# Patient Record
Sex: Male | Born: 1940 | Race: White | Hispanic: No | Marital: Married | Smoking: Current every day smoker
Health system: Southern US, Community
[De-identification: ages and names within clinical notes are randomized; demographics above are authoritative.]

## PROBLEM LIST (undated history)

## (undated) DIAGNOSIS — C61 Malignant neoplasm of prostate: Secondary | ICD-10-CM

## (undated) DIAGNOSIS — I10 Essential (primary) hypertension: Secondary | ICD-10-CM

## (undated) DIAGNOSIS — F17200 Nicotine dependence, unspecified, uncomplicated: Secondary | ICD-10-CM

## (undated) DIAGNOSIS — I2510 Atherosclerotic heart disease of native coronary artery without angina pectoris: Secondary | ICD-10-CM

## (undated) DIAGNOSIS — I219 Acute myocardial infarction, unspecified: Secondary | ICD-10-CM

## (undated) DIAGNOSIS — C801 Malignant (primary) neoplasm, unspecified: Secondary | ICD-10-CM

## (undated) DIAGNOSIS — D2321 Other benign neoplasm of skin of right ear and external auricular canal: Secondary | ICD-10-CM

## (undated) DIAGNOSIS — E785 Hyperlipidemia, unspecified: Secondary | ICD-10-CM

## (undated) DIAGNOSIS — R0600 Dyspnea, unspecified: Secondary | ICD-10-CM

## (undated) DIAGNOSIS — K635 Polyp of colon: Secondary | ICD-10-CM

## (undated) HISTORY — DX: Hyperlipidemia, unspecified: E78.5

## (undated) HISTORY — PX: CORONARY ANGIOPLASTY WITH STENT PLACEMENT: SHX49

## (undated) HISTORY — DX: Malignant (primary) neoplasm, unspecified: C80.1

## (undated) HISTORY — PX: CARDIAC SURGERY: SHX584

## (undated) HISTORY — DX: Other benign neoplasm of skin of right ear and external auricular canal: D23.21

## (undated) HISTORY — DX: Nicotine dependence, unspecified, uncomplicated: F17.200

## (undated) HISTORY — DX: Polyp of colon: K63.5

---

## 1978-02-14 HISTORY — PX: APPENDECTOMY: SHX54

## 2004-02-15 DIAGNOSIS — C801 Malignant (primary) neoplasm, unspecified: Secondary | ICD-10-CM

## 2004-02-15 HISTORY — DX: Malignant (primary) neoplasm, unspecified: C80.1

## 2004-02-15 HISTORY — PX: HIGH DOSE RADIATION IMPLANT INSERTION: SHX6258

## 2016-03-09 DIAGNOSIS — N401 Enlarged prostate with lower urinary tract symptoms: Secondary | ICD-10-CM | POA: Diagnosis not present

## 2016-04-14 DIAGNOSIS — I739 Peripheral vascular disease, unspecified: Secondary | ICD-10-CM | POA: Diagnosis not present

## 2016-04-14 DIAGNOSIS — I2510 Atherosclerotic heart disease of native coronary artery without angina pectoris: Secondary | ICD-10-CM | POA: Diagnosis not present

## 2016-04-14 DIAGNOSIS — R0609 Other forms of dyspnea: Secondary | ICD-10-CM | POA: Diagnosis not present

## 2016-04-14 DIAGNOSIS — R7301 Impaired fasting glucose: Secondary | ICD-10-CM | POA: Diagnosis not present

## 2017-01-20 DIAGNOSIS — Z23 Encounter for immunization: Secondary | ICD-10-CM | POA: Diagnosis not present

## 2017-05-03 ENCOUNTER — Ambulatory Visit
Admission: EM | Admit: 2017-05-03 | Discharge: 2017-05-03 | Disposition: A | Discharge status: Home / Self Care | Payer: Medicare Other | Attending: Family Medicine | Admitting: Family Medicine

## 2017-05-03 ENCOUNTER — Encounter: Payer: Self-pay | Admitting: Emergency Medicine

## 2017-05-03 ENCOUNTER — Other Ambulatory Visit: Payer: Self-pay

## 2017-05-03 DIAGNOSIS — Z20828 Contact with and (suspected) exposure to other viral communicable diseases: Secondary | ICD-10-CM | POA: Diagnosis not present

## 2017-05-03 DIAGNOSIS — I10 Essential (primary) hypertension: Secondary | ICD-10-CM

## 2017-05-03 DIAGNOSIS — Z760 Encounter for issue of repeat prescription: Secondary | ICD-10-CM | POA: Diagnosis not present

## 2017-05-03 HISTORY — DX: Acute myocardial infarction, unspecified: I21.9

## 2017-05-03 HISTORY — DX: Essential (primary) hypertension: I10

## 2017-05-03 MED ORDER — AMLODIPINE BESYLATE 10 MG PO TABS: 10.0000 mg | ORAL_TABLET | Freq: Every day | ORAL | 0 refills | Status: DC

## 2017-05-03 MED ORDER — OSELTAMIVIR PHOSPHATE 75 MG PO CAPS: 75.0000 mg | ORAL_CAPSULE | Freq: Every day | ORAL | 0 refills | Status: DC

## 2017-05-03 NOTE — Discharge Instructions (Signed)
Take medication as prescribed. Rest.   Establish primary physician as discussed. Return to Urgent care for new or worsening concerns.

## 2017-05-03 NOTE — ED Triage Notes (Signed)
Patient states that he has chronic sinus drainage.  Patient states that his granddaughter was recently diagnosed with the flu and would like a prescription for Tamiflu for prevention.

## 2017-05-03 NOTE — ED Provider Notes (Signed)
[facility] ____________________________________________  Time seen: Approximately 4:37 PM  I have reviewed the triage vital signs and the nursing notes.   HISTORY  Chief Complaint Nasal Congestion   HPI Dylan Villanueva is a 77 y.o. male presenting for request of prophylactic Tamiflu.  Patient reports that his 74-year-old granddaughter was diagnosed with flu yesterday and he has had direct contact with her.  States that he is new to this area from [place] and has not yet established with a primary care physician.  States he does have chronic postnasal drip without any acute changes.  Also reports that he has chronic hypertension and takes amlodipine, and requests medication refill until he is established with a new primary.  Denies any recent changes in his blood pressure medication and reports that he has been working well from him.  Reports feels well. Denies chest pain, shortness of breath, abdominal pain, or rash. Denies recent sickness. Denies recent antibiotic use.  Denies renal insufficiency   Past Medical History:  Diagnosis Date  . Hypertension   . Myocardial infarction ([place])     There are no active problems to display for this patient.   Past Surgical History:  Procedure Laterality Date  . CARDIAC SURGERY    Cardiac stents x2   No current facility-administered medications for this encounter.   Current Outpatient Medications:  .  aspirin EC 81 MG tablet, Take 81 mg by mouth daily., Disp: , Rfl:  .  atorvastatin (LIPITOR) 40 MG tablet, Take 40 mg by mouth daily., Disp: , Rfl:  .  cilostazol (PLETAL) 100 MG tablet, Take 100 mg by mouth 2 (two) times daily., Disp: , Rfl:  .  clopidogrel (PLAVIX) 75 MG tablet, Take 75 mg by mouth daily., Disp: , Rfl:  .  doxazosin (CARDURA) 2 MG tablet, Take 2 mg by mouth daily., Disp: , Rfl:  .  Multiple Vitamins-Minerals (MULTIVITAMIN ADULT PO), Take by mouth., Disp: , Rfl:  .  Vitamin D, Cholecalciferol, 1000 units CAPS,  Take by mouth., Disp: , Rfl:  .  amLODipine (NORVASC) 10 MG tablet, Take 1 tablet (10 mg total) by mouth daily., Disp: 30 tablet, Rfl: 0 .  oseltamivir (TAMIFLU) 75 MG capsule, Take 1 capsule (75 mg total) by mouth daily for 7 days., Disp: 7 capsule, Rfl: 0  Allergies Penicillins  History reviewed. No pertinent family history.  Social History Social History   Tobacco Use  . Smoking status: Current Every Day Smoker    Types: Cigarettes  . Smokeless tobacco: Never Used  Substance Use Topics  . Alcohol use: Yes  . Drug use: Not on file    Review of Systems Constitutional: No fever/chills ENT: No sore throat. Cardiovascular: Denies chest pain. Respiratory: Denies shortness of breath. Gastrointestinal: No abdominal pain.  Genitourinary: Negative for dysuria. Musculoskeletal: Negative for back pain. Skin: Negative for rash.   ____________________________________________   PHYSICAL EXAM:  VITAL SIGNS: ED Triage Vitals  Enc Vitals Group     BP 05/03/17 1628 (!) 167/76     Pulse Rate 05/03/17 1628 62     Resp 05/03/17 1628 16     Temp 05/03/17 1628 98.4 F (36.9 C)     Temp Source 05/03/17 1628 Oral     SpO2 05/03/17 1628 99 %     Weight 05/03/17 1623 155 lb (70.3 kg)     Height 05/03/17 1623 5\' 7"  (1.702 m)     Head Circumference --      Peak Flow --  Pain Score 05/03/17 1623 0     Pain Loc --      Pain Edu? --      Excl. in [place]? --     Constitutional: Alert and oriented. Well appearing and in no acute distress. Eyes: Conjunctivae are normal.  Head: Atraumatic. No sinus tenderness to palpation. No swelling. No erythema.  Ears: no erythema, normal TMs bilaterally.   Nose:No nasal congestion   Mouth/Throat: Mucous membranes are moist. No pharyngeal erythema. No tonsillar swelling or exudate.  Neck: No stridor.  No cervical spine tenderness to palpation. Hematological/Lymphatic/Immunilogical: No cervical lymphadenopathy. Cardiovascular: Normal rate, regular  rhythm. Grossly normal heart sounds.  Good peripheral circulation. Respiratory: Normal respiratory effort.  No retractions. No wheezes, rales or rhonchi. Good air movement.  Musculoskeletal: Ambulatory with steady gait.  Neurologic:  Normal speech and language. No gait instability. Skin:  Skin appears warm, dry and intact. No rash noted. Psychiatric: Mood and affect are normal. Speech and behavior are normal.  ___________________________________________   LABS (all labs ordered are listed, but only abnormal results are displayed)  Labs Reviewed - No data to display ____________________________________________  PROCEDURES Procedures   INITIAL IMPRESSION / ASSESSMENT AND PLAN / ED COURSE  Pertinent labs & imaging results that were available during my care of the patient were reviewed by me and considered in my medical decision making (see chart for details).  Well-appearing patient.  No acute distress.  Prophylactic Tamiflu Rx given.  Amlodipine Rx refill also given for 30 days.  Encourage PCP establishment, Dr. Vicente Masson information given.Discussed indication, risks and benefits of medications with patient.   Discussed follow up and return parameters including no resolution or any worsening concerns. Patient verbalized understanding and agreed to plan.   ____________________________________________   FINAL CLINICAL IMPRESSION(S) / ED DIAGNOSES  Final diagnoses:  Exposure to influenza  Hypertension, unspecified type  Medication refill     ED Discharge Orders        Ordered    amLODipine (NORVASC) 10 MG tablet  Daily     05/03/17 1642    oseltamivir (TAMIFLU) 75 MG capsule  Daily     05/03/17 1642       Note: This dictation was prepared with Dragon dictation along with smaller phrase technology. Any transcriptional errors that result from this process are unintentional.         Marylene Land, NP 05/03/17 1839

## 2017-05-08 ENCOUNTER — Ambulatory Visit (INDEPENDENT_AMBULATORY_CARE_PROVIDER_SITE_OTHER): Payer: Medicare Other | Admitting: Family Medicine

## 2017-05-08 ENCOUNTER — Encounter: Payer: Self-pay | Admitting: Family Medicine

## 2017-05-08 VITALS — BP 122/81 | HR 78 | Resp 16 | Ht 67.0 in | Wt 152.1 lb

## 2017-05-08 DIAGNOSIS — E782 Mixed hyperlipidemia: Secondary | ICD-10-CM | POA: Insufficient documentation

## 2017-05-08 DIAGNOSIS — Z8546 Personal history of malignant neoplasm of prostate: Secondary | ICD-10-CM

## 2017-05-08 DIAGNOSIS — E785 Hyperlipidemia, unspecified: Secondary | ICD-10-CM | POA: Diagnosis not present

## 2017-05-08 DIAGNOSIS — Z23 Encounter for immunization: Secondary | ICD-10-CM | POA: Diagnosis not present

## 2017-05-08 DIAGNOSIS — I872 Venous insufficiency (chronic) (peripheral): Secondary | ICD-10-CM | POA: Insufficient documentation

## 2017-05-08 DIAGNOSIS — R2681 Unsteadiness on feet: Secondary | ICD-10-CM

## 2017-05-08 DIAGNOSIS — E559 Vitamin D deficiency, unspecified: Secondary | ICD-10-CM

## 2017-05-08 DIAGNOSIS — I2510 Atherosclerotic heart disease of native coronary artery without angina pectoris: Secondary | ICD-10-CM | POA: Diagnosis not present

## 2017-05-08 DIAGNOSIS — I739 Peripheral vascular disease, unspecified: Secondary | ICD-10-CM

## 2017-05-08 DIAGNOSIS — I10 Essential (primary) hypertension: Secondary | ICD-10-CM

## 2017-05-08 DIAGNOSIS — F17200 Nicotine dependence, unspecified, uncomplicated: Secondary | ICD-10-CM | POA: Diagnosis not present

## 2017-05-08 DIAGNOSIS — Z955 Presence of coronary angioplasty implant and graft: Secondary | ICD-10-CM | POA: Diagnosis not present

## 2017-05-08 DIAGNOSIS — Z8619 Personal history of other infectious and parasitic diseases: Secondary | ICD-10-CM | POA: Diagnosis not present

## 2017-05-08 MED ORDER — TETANUS-DIPHTH-ACELL PERTUSSIS 5-2.5-18.5 LF-MCG/0.5 IM SUSP: 0.5000 mL | Freq: Once | INTRAMUSCULAR | 0 refills | Status: AC

## 2017-05-08 MED ORDER — ZOSTER VAC RECOMB ADJUVANTED 50 MCG/0.5ML IM SUSR: 0.5000 mL | Freq: Once | INTRAMUSCULAR | 1 refills | Status: AC

## 2017-05-08 MED ORDER — AMLODIPINE BESYLATE 10 MG PO TABS: 10.0000 mg | ORAL_TABLET | Freq: Every day | ORAL | 2 refills | Status: DC

## 2017-05-08 MED ORDER — CILOSTAZOL 100 MG PO TABS: 100.0000 mg | ORAL_TABLET | Freq: Two times a day (BID) | ORAL | 2 refills | Status: DC

## 2017-05-08 MED ORDER — ATORVASTATIN CALCIUM 40 MG PO TABS: 40.0000 mg | ORAL_TABLET | Freq: Every day | ORAL | 2 refills | Status: DC

## 2017-05-08 MED ORDER — LISINOPRIL 5 MG PO TABS
5.0000 mg | ORAL_TABLET | Freq: Every day | ORAL | 2 refills | Status: DC
Start: 2017-05-08 — End: 2017-06-08

## 2017-05-08 MED ORDER — CLOPIDOGREL BISULFATE 75 MG PO TABS: 75.0000 mg | ORAL_TABLET | Freq: Every day | ORAL | 2 refills | Status: DC

## 2017-05-08 NOTE — Progress Notes (Signed)
Date:  05/08/2017   Name:  Dylan Villanueva   DOB:  [birth year]   MRN:  [id number]  PCP:  Adline Potter, MD    Chief Complaint: Establish Care (Moved from [place] will need refill to Cardiology )   History of Present Illness:  This is a 77 y.o. male seen for initial visit, just moved here from [place]. CAD with MI 2006 and stent placement 2008, on DAPT/statin, no recent cardiac sxs, wants cards referral. PAD BLE L>R on DAPT/statin/Pletal, not surgical candidate, feels Pletal helps. Hx prostate ca 2008 s/p RT and pellet placement, seen yearly by urology, wants referral. Appy 1980, SCC removed R pinna. Smoker 1 ppd, willing to cut down but not quit. Father died 49 dementia/hip fx/pneumonia, mother died 77 pancreatic ca, brother died 59 brain aneurysm, sister died 38 breast ca. Had Pneumovax age 31, no Prevnar, tet status unknown, hx shingles, no zoster imm.  Colonoscopy 5 years ago showed polyps.  Review of Systems:  Review of Systems  Constitutional: Negative for chills and fever.  HENT: Negative for ear pain, sinus pain and trouble swallowing.   Eyes: Negative for pain.  Respiratory: Negative for cough and shortness of breath.   Cardiovascular: Negative for chest pain and leg swelling.  Gastrointestinal: Negative for abdominal pain, constipation and diarrhea.  Genitourinary: Negative for difficulty urinating.  Musculoskeletal: Negative for joint swelling.  Neurological: Negative for syncope and light-headedness.    Patient Active Problem List   Diagnosis Date Noted  . PAD (peripheral artery disease) ([place]) 05/08/2017  . CAD (coronary artery disease) 05/08/2017  . Hyperlipidemia 05/08/2017  . Hypertension 05/08/2017  . History of prostate cancer 05/08/2017  . Gait instability 05/08/2017  . Vitamin D deficiency 05/08/2017  . Presence of stent in coronary artery in patient with coronary artery disease 05/08/2017    Prior to Admission medications   Medication Sig Start Date End Date Taking?  Authorizing Provider  amLODipine (NORVASC) 10 MG tablet Take 1 tablet (10 mg total) by mouth daily. 05/08/17  Yes Arnetta Odeh, Gwyndolyn Saxon, MD  aspirin EC 81 MG tablet Take 81 mg by mouth daily.   Yes [provider]  atorvastatin (LIPITOR) 40 MG tablet Take 1 tablet (40 mg total) by mouth daily. 05/08/17  Yes Yadira Hada, Gwyndolyn Saxon, MD  cilostazol (PLETAL) 100 MG tablet Take 1 tablet (100 mg total) by mouth 2 (two) times daily. 05/08/17  Yes Hilary Milks, Gwyndolyn Saxon, MD  clopidogrel (PLAVIX) 75 MG tablet Take 1 tablet (75 mg total) by mouth daily. 05/08/17  Yes Kyrene Longan, Gwyndolyn Saxon, MD  Multiple Vitamins-Minerals (MULTIVITAMIN ADULT PO) Take 1 capsule by mouth daily.   Yes [provider]  Vitamin D, Cholecalciferol, 1000 units CAPS Take 1 capsule by mouth daily.   Yes [provider]  lisinopril (PRINIVIL,ZESTRIL) 5 MG tablet Take 1 tablet (5 mg total) by mouth daily. 05/08/17   Laurren Lepkowski, Gwyndolyn Saxon, MD  Tdap [facility]es) 5-2.5-18.5 LF-MCG/0.5 injection Inject 0.5 mLs into the muscle once for 1 dose. 05/08/17 05/08/17  Tanishia Lemaster, Gwyndolyn Saxon, MD  Zoster Vaccine Adjuvanted [facility]) injection Inject 0.5 mLs into the muscle once for 1 dose. 05/08/17 05/08/17  Adline Potter, MD    Allergies  Allergen Reactions  . Penicillins Nausea Only    Past Surgical History:  Procedure Laterality Date  . APPENDECTOMY  1980  . CARDIAC SURGERY    . CORONARY ANGIOPLASTY WITH STENT PLACEMENT    . HIGH DOSE RADIATION IMPLANT INSERTION  2006    Social History   Tobacco Use  . Smoking  status: Current Every Day Smoker    Packs/day: 1.00    Years: 65.00    Pack years: 65.00    Types: Cigarettes  . Smokeless tobacco: Never Used  . Tobacco comment:      Substance Use Topics  . Alcohol use: Yes    Alcohol/week: 2.4 oz    Types: 4 Cans of beer per week  . Drug use: Not on file    Family History  Problem Relation Age of Onset  . Cancer Mother        pancriaticv  . Cancer Sister        breast   . Aneurysm Brother      Medication list has been reviewed and updated.  Physical Examination: BP 122/81   Pulse 78   Resp 16   Ht 5\' 7"  (1.702 m)   Wt 152 lb 1.6 oz (69 kg)   SpO2 98%   BMI 23.82 kg/m   Physical Exam  Constitutional: He is oriented to person, place, and time. He appears well-developed and well-nourished.  HENT:  Right Ear: External ear normal.  Left Ear: External ear normal.  Nose: Nose normal.  Mouth/Throat: Oropharynx is clear and moist.  TMs clear  Eyes: Pupils are equal, round, and reactive to light. Conjunctivae and EOM are normal.  Neck: Neck supple. No thyromegaly present.  Cardiovascular: Normal rate, regular rhythm and normal heart sounds.  Pulmonary/Chest: Effort normal and breath sounds normal.  Abdominal: Soft. He exhibits no distension and no mass. There is no tenderness.  Musculoskeletal: He exhibits no edema.  Lymphadenopathy:    He has no cervical adenopathy.  Neurological: He is alert and oriented to person, place, and time. Coordination normal.  Romberg pos, gait sl unsteady  Skin: Skin is warm and dry.  Psychiatric: He has a normal mood and affect. His behavior is normal.  Nursing note and vitals reviewed.   Assessment and Plan:  1. PAD (peripheral artery disease) ([place]) Not surgical candidate, cont DAPT/statin/Pletal (feels helps)  2. Presence of stent in coronary artery in patient with coronary artery disease Stable, cont DAPT (unclear indication given last stent placement 2008), statin, add ACEI - Ambulatory referral to Cardiology  3. Essential hypertension Well controlled, cont amlodipine, change Cardura to lisinopril - Comprehensive Metabolic Panel (CMET) - CBC  4. Hyperlipidemia, unspecified hyperlipidemia type Unclear control on Lipitor - TSH - Lipid Profile  5. Gait instability - B12  6. Vitamin D deficiency On supplement - Vitamin D (25 hydroxy)  7. Smoker Advised cessation, willing to cut back to 1/2 ppd - Nurse to provide  smoking / tobacco cessation education  8. History of prostate cancer S/p RT - Ambulatory referral to Urology  9. History of shingles  10. Need for pneumococcal vaccination - Pneumococcal conjugate vaccine 13-valent  11. Need for diphtheria-tetanus-pertussis (Tdap) vaccine - Tdap (BOOSTRIX) 5-2.5-18.5 LF-MCG/0.5 injection; Inject 0.5 mLs into the muscle once for 1 dose.  Dispense: 0.5 mL; Refill: 0  12. Need for zoster vaccination - Zoster Vaccine Adjuvanted [facility]) injection; Inject 0.5 mLs into the muscle once for 1 dose.  Dispense: 0.5 mL; Refill: 1  Return in about 1 month (around 06/05/2017).   One hour spent with patient over half in counseling  Satira Anis. [facility]  05/08/2017

## 2017-05-08 NOTE — Patient Instructions (Addendum)
Change doxazosin to lisinopril 5 mg daily.

## 2017-05-09 LAB — LIPID PANEL
CHOLESTEROL TOTAL: 208 mg/dL — AB (ref 100–199)
Chol/HDL Ratio: 3.6 ratio (ref 0.0–5.0)
HDL: 57 mg/dL (ref >39)
LDL CALC: 130 mg/dL — AB (ref 0–99)
Triglycerides: 107 mg/dL (ref 0–149)
VLDL CHOLESTEROL CAL: 21 mg/dL (ref 5–40)

## 2017-05-09 LAB — TSH: TSH: 1.05 u[IU]/mL (ref 0.450–4.500)

## 2017-05-09 LAB — COMPREHENSIVE METABOLIC PANEL
A/G RATIO: 2 (ref 1.2–2.2)
ALK PHOS: 73 IU/L (ref 39–117)
ALT: 18 IU/L (ref 0–44)
AST: 24 IU/L (ref 0–40)
Albumin: 4.7 g/dL (ref 3.5–4.8)
BUN/Creatinine Ratio: 17 (ref 10–24)
BUN: 15 mg/dL (ref 8–27)
CHLORIDE: 101 mmol/L (ref 96–106)
CO2: 24 mmol/L (ref 20–29)
Calcium: 9.7 mg/dL (ref 8.6–10.2)
Creatinine, Ser: 0.89 mg/dL (ref 0.76–1.27)
GFR calc Af Amer: 95 mL/min/{1.73_m2} (ref >59)
GFR calc non Af Amer: 82 mL/min/{1.73_m2} (ref >59)
GLUCOSE: 90 mg/dL (ref 65–99)
Globulin, Total: 2.3 g/dL (ref 1.5–4.5)
POTASSIUM: 4.6 mmol/L (ref 3.5–5.2)
Sodium: 142 mmol/L (ref 134–144)
TOTAL PROTEIN: 7 g/dL (ref 6.0–8.5)

## 2017-05-09 LAB — VITAMIN B12: Vitamin B-12: 631 pg/mL (ref 232–1245)

## 2017-05-09 LAB — CBC
HEMATOCRIT: 44.3 % (ref 37.5–51.0)
HEMOGLOBIN: 15.4 g/dL (ref 13.0–17.7)
MCH: 33.8 pg — ABNORMAL HIGH (ref 26.6–33.0)
MCHC: 34.8 g/dL (ref 31.5–35.7)
MCV: 97 fL (ref 79–97)
Platelets: 220 10*3/uL (ref 150–379)
RBC: 4.55 x10E6/uL (ref 4.14–5.80)
RDW: 13.4 % (ref 12.3–15.4)
WBC: 7.1 10*3/uL (ref 3.4–10.8)

## 2017-05-09 LAB — VITAMIN D 25 HYDROXY (VIT D DEFICIENCY, FRACTURES): VIT D 25 HYDROXY: 36.5 ng/mL (ref 30.0–100.0)

## 2017-05-25 DIAGNOSIS — I70219 Atherosclerosis of native arteries of extremities with intermittent claudication, unspecified extremity: Secondary | ICD-10-CM | POA: Diagnosis not present

## 2017-05-25 DIAGNOSIS — E785 Hyperlipidemia, unspecified: Secondary | ICD-10-CM | POA: Diagnosis not present

## 2017-05-25 DIAGNOSIS — E782 Mixed hyperlipidemia: Secondary | ICD-10-CM | POA: Diagnosis not present

## 2017-05-25 DIAGNOSIS — I10 Essential (primary) hypertension: Secondary | ICD-10-CM | POA: Insufficient documentation

## 2017-05-25 DIAGNOSIS — I214 Non-ST elevation (NSTEMI) myocardial infarction: Secondary | ICD-10-CM | POA: Insufficient documentation

## 2017-05-25 DIAGNOSIS — I25118 Atherosclerotic heart disease of native coronary artery with other forms of angina pectoris: Secondary | ICD-10-CM | POA: Diagnosis not present

## 2017-06-01 ENCOUNTER — Ambulatory Visit (INDEPENDENT_AMBULATORY_CARE_PROVIDER_SITE_OTHER): Payer: Medicare Other

## 2017-06-01 VITALS — BP 122/60 | HR 88 | Temp 98.5°F | Resp 14 | Ht 67.0 in | Wt 151.2 lb

## 2017-06-01 DIAGNOSIS — H539 Unspecified visual disturbance: Secondary | ICD-10-CM | POA: Diagnosis not present

## 2017-06-01 DIAGNOSIS — Z0000 Encounter for general adult medical examination without abnormal findings: Secondary | ICD-10-CM | POA: Diagnosis not present

## 2017-06-01 NOTE — Patient Instructions (Addendum)
Dylan Villanueva , Thank you for taking time to come for your Medicare Wellness Visit. I appreciate your ongoing commitment to your health goals. Please review the following plan we discussed and let me know if I can assist you in the future.   Screening recommendations/referrals: Colorectal Screening: No longer required  Vision and Dental Exams: Recommended annual ophthalmology exams for early detection of glaucoma and other disorders of the eye Recommended annual dental exams for proper oral hygiene  Vaccinations: Influenza vaccine: Up to date Pneumococcal vaccine: Up to date Tdap vaccine: Up to date Shingles vaccine: Up to date  Advanced directives: Please bring a copy of your POA (Power of [place]) and/or Living Will to your next appointment.  Conditions/risks identified: Recommend to drink at least 6-8 8oz glasses of water per day.  Next appointment: Please schedule your Annual Wellness Visit with your Nurse Health Advisor in one year.  Preventive Care 79 Years and Older, Male Preventive care refers to lifestyle choices and visits with your health care provider that can promote health and wellness. What does preventive care include?  A yearly physical exam. This is also called an annual well check.  Dental exams once or twice a year.  Routine eye exams. Ask your health care provider how often you should have your eyes checked.  Personal lifestyle choices, including:  Daily care of your teeth and gums.  Regular physical activity.  Eating a healthy diet.  Avoiding tobacco and drug use.  Limiting alcohol use.  Practicing safe sex.  Taking low doses of aspirin every day.  Taking vitamin and mineral supplements as recommended by your health care provider. What happens during an annual well check? The services and screenings done by your health care provider during your annual well check will depend on your age, overall health, lifestyle risk factors, and family history of  disease. Counseling  Your health care provider may ask you questions about your:  Alcohol use.  Tobacco use.  Drug use.  Emotional well-being.  Home and relationship well-being.  Sexual activity.  Eating habits.  History of falls.  Memory and ability to understand (cognition).  Work and work Statistician. Screening  You may have the following tests or measurements:  Height, weight, and BMI.  Blood pressure.  Lipid and cholesterol levels. These may be checked every 5 years, or more frequently if you are over 49 years old.  Skin check.  Lung cancer screening. You may have this screening every year starting at age 79 if you have a 30-pack-year history of smoking and currently smoke or have quit within the past 15 years.  Fecal occult blood test (FOBT) of the stool. You may have this test every year starting at age 79.  Flexible sigmoidoscopy or colonoscopy. You may have a sigmoidoscopy every 5 years or a colonoscopy every 10 years starting at age 70.  Prostate cancer screening. Recommendations will vary depending on your family history and other risks.  Hepatitis C blood test.  Hepatitis B blood test.  Sexually transmitted disease (STD) testing.  Diabetes screening. This is done by checking your blood sugar (glucose) after you have not eaten for a while (fasting). You may have this done every 1-3 years.  Abdominal aortic aneurysm (AAA) screening. You may need this if you are a current or former smoker.  Osteoporosis. You may be screened starting at age 55 if you are at high risk. Talk with your health care provider about your test results, treatment options, and if necessary, the  need for more tests. Vaccines  Your health care provider may recommend certain vaccines, such as:  Influenza vaccine. This is recommended every year.  Tetanus, diphtheria, and acellular pertussis (Tdap, Td) vaccine. You may need a Td booster every 10 years.  Zoster vaccine. You may  need this after age 79.  Pneumococcal 13-valent conjugate (PCV13) vaccine. One dose is recommended after age 23.  Pneumococcal polysaccharide (PPSV23) vaccine. One dose is recommended after age 51. Talk to your health care provider about which screenings and vaccines you need and how often you need them. This information is not intended to replace advice given to you by your health care provider. Make sure you discuss any questions you have with your health care provider. Document Released: 02/27/2015 Document Revised: 10/21/2015 Document Reviewed: 12/02/2014 Elsevier Interactive Patient Education  2017 [place] Prevention in the Home Falls can cause injuries. They can happen to people of all ages. There are many things you can do to make your home safe and to help prevent falls. What can I do on the outside of my home?  Regularly fix the edges of walkways and driveways and fix any cracks.  Remove anything that might make you trip as you walk through a door, such as a raised step or threshold.  Trim any bushes or trees on the path to your home.  Use bright outdoor lighting.  Clear any walking paths of anything that might make someone trip, such as rocks or tools.  Regularly check to see if handrails are loose or broken. Make sure that both sides of any steps have handrails.  Any raised decks and porches should have guardrails on the edges.  Have any leaves, snow, or ice cleared regularly.  Use sand or salt on walking paths during winter.  Clean up any spills in your garage right away. This includes oil or grease spills. What can I do in the bathroom?  Use night lights.  Install grab bars by the toilet and in the tub and shower. Do not use towel bars as grab bars.  Use non-skid mats or decals in the tub or shower.  If you need to sit down in the shower, use a plastic, non-slip stool.  Keep the floor dry. Clean up any water that spills on the floor as soon as it  happens.  Remove soap buildup in the tub or shower regularly.  Attach bath mats securely with double-sided non-slip rug tape.  Do not have throw rugs and other things on the floor that can make you trip. What can I do in the bedroom?  Use night lights.  Make sure that you have a light by your bed that is easy to reach.  Do not use any sheets or blankets that are too big for your bed. They should not hang down onto the floor.  Have a firm chair that has side arms. You can use this for support while you get dressed.  Do not have throw rugs and other things on the floor that can make you trip. What can I do in the kitchen?  Clean up any spills right away.  Avoid walking on wet floors.  Keep items that you use a lot in easy-to-reach places.  If you need to reach something above you, use a strong step stool that has a grab bar.  Keep electrical cords out of the way.  Do not use floor polish or wax that makes floors slippery. If you must use wax,  use non-skid floor wax.  Do not have throw rugs and other things on the floor that can make you trip. What can I do with my stairs?  Do not leave any items on the stairs.  Make sure that there are handrails on both sides of the stairs and use them. Fix handrails that are broken or loose. Make sure that handrails are as long as the stairways.  Check any carpeting to make sure that it is firmly attached to the stairs. Fix any carpet that is loose or worn.  Avoid having throw rugs at the top or bottom of the stairs. If you do have throw rugs, attach them to the floor with carpet tape.  Make sure that you have a light switch at the top of the stairs and the bottom of the stairs. If you do not have them, ask someone to add them for you. What else can I do to help prevent falls?  Wear shoes that:  Do not have high heels.  Have rubber bottoms.  Are comfortable and fit you well.  Are closed at the toe. Do not wear sandals.  If you  use a stepladder:  Make sure that it is fully opened. Do not climb a closed stepladder.  Make sure that both sides of the stepladder are locked into place.  Ask someone to hold it for you, if possible.  Clearly mark and make sure that you can see:  Any grab bars or handrails.  First and last steps.  Where the edge of each step is.  Use tools that help you move around (mobility aids) if they are needed. These include:  Canes.  Walkers.  Scooters.  Crutches.  Turn on the lights when you go into a dark area. Replace any light bulbs as soon as they burn out.  Set up your furniture so you have a clear path. Avoid moving your furniture around.  If any of your floors are uneven, fix them.  If there are any pets around you, be aware of where they are.  Review your medicines with your doctor. Some medicines can make you feel dizzy. This can increase your chance of falling. Ask your doctor what other things that you can do to help prevent falls. This information is not intended to replace advice given to you by your health care provider. Make sure you discuss any questions you have with your health care provider. Document Released: 11/27/2008 Document Revised: 07/09/2015 Document Reviewed: 03/07/2014 Elsevier Interactive Patient Education  2017 [employer].

## 2017-06-01 NOTE — Progress Notes (Signed)
Subjective:   Dylan Villanueva is a 77 y.o. male who presents for Medicare Annual/Subsequent preventive examination.  Review of Systems:  N/A Cardiac Risk Factors include: advanced age (>67men, >12 women);dyslipidemia;male gender;hypertension;sedentary lifestyle;smoking/ tobacco exposure     Objective:    Vitals: BP 122/60 (BP Location: Right Arm, Patient Position: Sitting, Cuff Size: Normal)   Pulse 88   Temp 98.5 F (36.9 C) (Oral)   Resp 14   Ht 5\' 7"  (1.702 m)   Wt 151 lb 3.2 oz (68.6 kg)   SpO2 92%   BMI 23.68 kg/m   Body mass index is 23.68 kg/m.  Advanced Directives 06/01/2017 05/03/2017  Does Patient Have a Medical Advance Directive? Yes Yes  Type of Paramedic of [place];Living will [place];Living will  Copy of [place] in Chart? No - copy requested -    Tobacco Social History   Tobacco Use  Smoking Status Current Every Day Smoker  . Packs/day: 1.00  . Years: 66.00  . Pack years: 66.00  . Types: Cigarettes  Smokeless Tobacco Never Used  Tobacco Comment           Ready to quit: No Counseling given: Yes Comment:       Clinical Intake:  Pre-visit preparation completed: Yes  Pain : No/denies pain   BMI - recorded: 23.68 Nutritional Status: BMI of 19-24  Normal Nutritional Risks: None Diabetes: No  How often do you need to have someone help you when you read instructions, pamphlets, or other written materials from your doctor or pharmacy?: 1 - Never  Interpreter Needed?: No  Information entered by :: AEversole, LPN  Past Medical History:  Diagnosis Date  . Cancer [facility]) 2006   prostate   . Colon polyps   . Hypertension   . Myocardial infarction ([place])   . Smoker   . Squamous acanthoma of external ear, right    Past Surgical History:  Procedure Laterality Date  . APPENDECTOMY  1980  . CARDIAC SURGERY    . CORONARY ANGIOPLASTY WITH STENT PLACEMENT    . HIGH DOSE RADIATION  IMPLANT INSERTION  2006   Family History  Problem Relation Age of Onset  . Cancer Mother        pancreatic  . Dementia Father   . Cancer Sister        breast   . Aneurysm Brother    Social History   Socioeconomic History  . Marital status: Married    Spouse name: Jana Half  . Number of children: 2  . Years of education: Not on file  . Highest education level: Bachelor's degree (e.g., BA, AB, BS)  Occupational History  . Occupation: retired   Scientific laboratory technician  . Financial resource strain: Not hard at all  . Food insecurity:    Worry: Never true    Inability: Never true  . Transportation needs:    Medical: No    Non-medical: No  Tobacco Use  . Smoking status: Current Every Day Smoker    Packs/day: 1.00    Years: 66.00    Pack years: 66.00    Types: Cigarettes  . Smokeless tobacco: Never Used  . Tobacco comment:      Substance and Sexual Activity  . Alcohol use: Yes    Alcohol/week: 1.8 oz    Types: 3 Cans of beer per week  . Drug use: Never  . Sexual activity: Not Currently  Lifestyle  . Physical activity:  Days per week: 0 days    Minutes per session: 0 min  . Stress: Not at all  Relationships  . Social connections:    Talks on phone: Patient refused    Gets together: Patient refused    Attends religious service: Patient refused    Active member of club or organization: Patient refused    Attends meetings of clubs or organizations: Patient refused    Relationship status: Married  Other Topics Concern  . Not on file  Social History Narrative  . Not on file    Outpatient Encounter Medications as of 06/01/2017  Medication Sig  . amLODipine (NORVASC) 10 MG tablet Take 1 tablet (10 mg total) by mouth daily.  Marland Kitchen aspirin EC 81 MG tablet Take 81 mg by mouth daily.  Marland Kitchen atorvastatin (LIPITOR) 40 MG tablet Take 1 tablet (40 mg total) by mouth daily.  . cilostazol (PLETAL) 100 MG tablet Take 1 tablet (100 mg total) by mouth 2 (two) times daily.  . clopidogrel (PLAVIX)  75 MG tablet Take 1 tablet (75 mg total) by mouth daily.  Marland Kitchen lisinopril (PRINIVIL,ZESTRIL) 5 MG tablet Take 1 tablet (5 mg total) by mouth daily.  . Multiple Vitamins-Minerals (MULTIVITAMIN ADULT PO) Take 1 capsule by mouth daily.  . Vitamin D, Cholecalciferol, 1000 units CAPS Take 1 capsule by mouth daily.   No facility-administered encounter medications on file as of 06/01/2017.     Activities of Daily Living In your present state of health, do you have any difficulty performing the following activities: 06/01/2017 05/08/2017  Hearing? N N  Comment denies hearing aids -  Vision? Y N  Comment reading glasses; c/o visual changes -  Difficulty concentrating or making decisions? N N  Walking or climbing stairs? Y N  Comment leg pain -  Dressing or bathing? N N  Doing errands, shopping? N N  Preparing Food and eating ? N -  Comment full upper dentures -  Using the Toilet? N -  In the past six months, have you accidently leaked urine? Y -  Comment dribbling at times r/t prostate cancer -  Do you have problems with loss of bowel control? N -  Managing your Medications? N -  Managing your Finances? N -  Housekeeping or managing your Housekeeping? N -    Patient Care Team: Juline Patch, MD as PCP - General (Family Medicine) Corey Skains, MD as Consulting Physician (Cardiology) Hollice Espy, MD as Consulting Physician (Urology)   Assessment:   This is a routine wellness examination for Dylan Villanueva.  Exercise Activities and Dietary recommendations Current Exercise Habits: The patient does not participate in regular exercise at present, Exercise limited by: None identified  Goals    . DIET - INCREASE WATER INTAKE     Recommend to drink at least 6-8 8oz glasses of water per day.       Fall Risk Fall Risk  06/01/2017 05/08/2017  Falls in the past year? No No  Risk for fall due to : Impaired vision -  Risk for fall due to: Comment c/o changes with his vision -   Is the home  free of loose throw rugs in walkways, pet beds, electrical cords, etc? Yes Adequate lighting to reduce risk of falls?  Yes In addition, does the patient have any of the following: Stairs in or around the home WITH handrails? Yes Grab bars in the bathroom? No  Shower chair or a place to sit while bathing? Yes Use of an  elevated toilet seat or a handicapped toilet? Yes Use of a cane, walker or w/c? No  Timed Get Up and Go Performed: Yes. Pt ambulated 10 feet within 5 sec. Gait stead-fast and without the use of an assistive device. No intervention required at this time. Fall risk prevention has been discussed.  Pt declined my offer to send Community Resource Referral to Care Guide for installation of grab bars in the shower.  Depression Screen PHQ 2/9 Scores 06/01/2017 05/08/2017  PHQ - 2 Score 0 0  PHQ- 9 Score 0 -    Cognitive Function     6CIT Screen 06/01/2017  What Year? 0 points  What month? 0 points  What time? 0 points  Count back from 20 0 points  Months in reverse 0 points  Repeat phrase 0 points  Total Score 0    Immunization History  Administered Date(s) Administered  . Influenza, High Dose Seasonal PF 01/20/2017  . Pneumococcal Conjugate-13 05/08/2017  . Tdap 05/16/2017  . Zoster Recombinat (Shingrix) 05/16/2017    Qualifies for Shingles Vaccine? No  Screening Tests Health Maintenance  Topic Date Due  . INFLUENZA VACCINE  11/23/2017 (Originally 09/14/2017)  . PNA vac Low Risk Adult (2 of 2 - PPSV23) 05/09/2018  . TETANUS/TDAP  05/17/2027   Cancer Screenings: Lung: Low Dose CT Chest recommended if Age 40-80 years, 30 pack-year currently smoking OR have quit w/in 15years. Patient does not qualify. Colorectal: No longer required  Additional Screenings: Hepatitis C Screening: Does not qualify     Plan:  I have personally reviewed and addressed the Medicare Annual Wellness questionnaire and have noted the following in the patient's chart:  A. Medical and  social history B. Use of alcohol, tobacco or illicit drugs  C. Current medications and supplements D. Functional ability and status E.  Nutritional status F.  Physical activity G. Advance directives H. List of other physicians I.  Hospitalizations, surgeries, and ER visits in previous 12 months J.  [place] such as hearing and vision if needed, cognitive and depression L. Referrals and appointments  In addition, I have reviewed and discussed with patient certain preventive protocols, quality metrics, and best practice recommendations. A written personalized care plan for preventive services as well as general preventive health recommendations were provided to patient.  Signed,  Aleatha Borer, LPN Nurse Health Advisor  MD Recommendations: None

## 2017-06-08 ENCOUNTER — Ambulatory Visit (INDEPENDENT_AMBULATORY_CARE_PROVIDER_SITE_OTHER): Payer: Medicare Other | Admitting: Family Medicine

## 2017-06-08 ENCOUNTER — Ambulatory Visit: Payer: Medicare Other | Admitting: Family Medicine

## 2017-06-08 ENCOUNTER — Encounter: Payer: Self-pay | Admitting: Family Medicine

## 2017-06-08 VITALS — BP 114/76 | HR 81 | Resp 16 | Ht 67.0 in | Wt 153.8 lb

## 2017-06-08 DIAGNOSIS — Z955 Presence of coronary angioplasty implant and graft: Secondary | ICD-10-CM | POA: Diagnosis not present

## 2017-06-08 DIAGNOSIS — E559 Vitamin D deficiency, unspecified: Secondary | ICD-10-CM | POA: Diagnosis not present

## 2017-06-08 DIAGNOSIS — I2510 Atherosclerotic heart disease of native coronary artery without angina pectoris: Secondary | ICD-10-CM | POA: Diagnosis not present

## 2017-06-08 DIAGNOSIS — E782 Mixed hyperlipidemia: Secondary | ICD-10-CM | POA: Diagnosis not present

## 2017-06-08 DIAGNOSIS — I10 Essential (primary) hypertension: Secondary | ICD-10-CM | POA: Diagnosis not present

## 2017-06-08 DIAGNOSIS — F17210 Nicotine dependence, cigarettes, uncomplicated: Secondary | ICD-10-CM | POA: Diagnosis not present

## 2017-06-08 DIAGNOSIS — I70219 Atherosclerosis of native arteries of extremities with intermittent claudication, unspecified extremity: Secondary | ICD-10-CM | POA: Diagnosis not present

## 2017-06-08 MED ORDER — CLOPIDOGREL BISULFATE 75 MG PO TABS: 75.0000 mg | ORAL_TABLET | Freq: Every day | ORAL | 2 refills | Status: DC

## 2017-06-08 MED ORDER — ATORVASTATIN CALCIUM 40 MG PO TABS: 40.0000 mg | ORAL_TABLET | Freq: Every day | ORAL | 2 refills | Status: DC

## 2017-06-08 MED ORDER — ASPIRIN EC 81 MG PO TBEC: 81.0000 mg | DELAYED_RELEASE_TABLET | Freq: Every day | ORAL | 3 refills | Status: DC

## 2017-06-08 MED ORDER — VITAMIN D (CHOLECALCIFEROL) 25 MCG (1000 UT) PO CAPS: 1.0000 | ORAL_CAPSULE | Freq: Every day | ORAL | 3 refills | Status: DC

## 2017-06-08 MED ORDER — CILOSTAZOL 100 MG PO TABS: 100.0000 mg | ORAL_TABLET | Freq: Two times a day (BID) | ORAL | 2 refills | Status: DC

## 2017-06-08 MED ORDER — AMLODIPINE BESYLATE 10 MG PO TABS: 10.0000 mg | ORAL_TABLET | Freq: Every day | ORAL | 2 refills | Status: DC

## 2017-06-08 MED ORDER — LISINOPRIL 5 MG PO TABS: 5.0000 mg | ORAL_TABLET | Freq: Every day | ORAL | 2 refills | Status: DC

## 2017-06-08 NOTE — Progress Notes (Addendum)
Name: Dylan Villanueva   MRN: [id number]    DOB: 01-07-1941   Date:06/08/2017       Progress Note  Subjective  Chief Complaint  Chief Complaint  Patient presents with  . Hypertension    Follow Up -previous [place] Patient. Last OV changes 05/03/17 :Cardura changed to lisinopril  . Nicotine Dependence    Discussed smoking last OV in March and agreed to go to 1/2 pack but has not done that yet.     Hypertension  This is a new problem. The current episode started in the past 7 days. The problem has been gradually improving since onset. The problem is controlled. Pertinent negatives include no anxiety, blurred vision, chest pain, headaches, malaise/fatigue, neck pain, orthopnea, palpitations, peripheral edema, PND, shortness of breath or sweats. There are no associated agents to hypertension. There are no known risk factors for coronary artery disease. Past treatments include calcium channel blockers. The current treatment provides moderate improvement. There are no compliance problems.  There is no history of angina, kidney disease, CAD/MI, CVA, heart failure, left ventricular hypertrophy, PVD or retinopathy. There is no history of chronic renal disease, a hypertension causing med or renovascular disease.  Nicotine Dependence  Presents for follow-up visit. Symptoms are negative for insomnia and sore throat. His urge triggers include company of smokers. The symptoms have been stable. He smokes 1 pack of cigarettes per day. Compliance with prior treatments has been good.    No problem-specific Assessment & Plan notes found for this encounter.   Past Medical History:  Diagnosis Date  . Cancer [facility]) 2006   prostate   . Colon polyps   . Hypertension   . Myocardial infarction ([place])   . Smoker   . Squamous acanthoma of external ear, right     Past Surgical History:  Procedure Laterality Date  . APPENDECTOMY  1980  . CARDIAC SURGERY    . CORONARY ANGIOPLASTY WITH STENT PLACEMENT    . HIGH DOSE  RADIATION IMPLANT INSERTION  2006    Family History  Problem Relation Age of Onset  . Cancer Mother        pancreatic  . Dementia Father   . Cancer Sister        breast   . Aneurysm Brother     Social History   Socioeconomic History  . Marital status: Married    Spouse name: Jana Half  . Number of children: 2  . Years of education: Not on file  . Highest education level: Bachelor's degree (e.g., BA, AB, BS)  Occupational History  . Occupation: retired   Scientific laboratory technician  . Financial resource strain: Not hard at all  . Food insecurity:    Worry: Never true    Inability: Never true  . Transportation needs:    Medical: No    Non-medical: No  Tobacco Use  . Smoking status: Current Every Day Smoker    Packs/day: 1.00    Years: 66.00    Pack years: 66.00    Types: Cigarettes  . Smokeless tobacco: Never Used  . Tobacco comment:      Substance and Sexual Activity  . Alcohol use: Yes    Alcohol/week: 1.8 oz    Types: 3 Cans of beer per week  . Drug use: Never  . Sexual activity: Not Currently  Lifestyle  . Physical activity:    Days per week: 0 days    Minutes per session: 0 min  . Stress: Not at all  Relationships  .  Social connections:    Talks on phone: Patient refused    Gets together: Patient refused    Attends religious service: Patient refused    Active member of club or organization: Patient refused    Attends meetings of clubs or organizations: Patient refused    Relationship status: Married  . Intimate partner violence:    Fear of current or ex partner: No    Emotionally abused: No    Physically abused: No    Forced sexual activity: No  Other Topics Concern  . Not on file  Social History Narrative  . Not on file    Allergies  Allergen Reactions  . Penicillins Nausea Only    Outpatient Medications Prior to Visit  Medication Sig Dispense Refill  . Multiple Vitamins-Minerals (MULTIVITAMIN ADULT PO) Take 1 capsule by mouth daily.    Marland Kitchen amLODipine  (NORVASC) 10 MG tablet Take 1 tablet (10 mg total) by mouth daily. 30 tablet 2  . aspirin EC 81 MG tablet Take 81 mg by mouth daily.    Marland Kitchen atorvastatin (LIPITOR) 40 MG tablet Take 1 tablet (40 mg total) by mouth daily. 30 tablet 2  . cilostazol (PLETAL) 100 MG tablet Take 1 tablet (100 mg total) by mouth 2 (two) times daily. 60 tablet 2  . clopidogrel (PLAVIX) 75 MG tablet Take 1 tablet (75 mg total) by mouth daily. 30 tablet 2  . lisinopril (PRINIVIL,ZESTRIL) 5 MG tablet Take 1 tablet (5 mg total) by mouth daily. 30 tablet 2  . Vitamin D, Cholecalciferol, 1000 units CAPS Take 1 capsule by mouth daily.     No facility-administered medications prior to visit.     Review of Systems  Constitutional: Negative for chills, fever, malaise/fatigue and weight loss.  HENT: Negative for ear discharge, ear pain and sore throat.   Eyes: Negative for blurred vision.  Respiratory: Negative for cough, sputum production, shortness of breath and wheezing.   Cardiovascular: Negative for chest pain, palpitations, orthopnea, leg swelling and PND.  Gastrointestinal: Negative for abdominal pain, blood in stool, constipation, diarrhea, heartburn, melena and nausea.  Genitourinary: Negative for dysuria, frequency, hematuria and urgency.  Musculoskeletal: Negative for back pain, joint pain, myalgias and neck pain.  Skin: Negative for rash.  Neurological: Negative for dizziness, tingling, sensory change, focal weakness and headaches.  Endo/Heme/Allergies: Negative for environmental allergies and polydipsia. Does not bruise/bleed easily.  Psychiatric/Behavioral: Negative for depression and suicidal ideas. The patient is not nervous/anxious and does not have insomnia.      Objective  Vitals:   06/08/17 1025  BP: 114/76  Pulse: 81  Resp: 16  SpO2: 96%  Weight: 153 lb 12.8 oz (69.8 kg)  Height: 5\' 7"  (1.702 m)    Physical Exam  Constitutional: He is oriented to person, place, and time.  HENT:  Head:  Normocephalic.  Right Ear: External ear normal.  Left Ear: External ear normal.  Nose: Nose normal.  Mouth/Throat: Oropharynx is clear and moist.  Eyes: Pupils are equal, round, and reactive to light. Conjunctivae and EOM are normal. Right eye exhibits no discharge. Left eye exhibits no discharge. No scleral icterus.  Neck: Normal range of motion. Neck supple. No JVD present. No tracheal deviation present. No thyromegaly present.  Cardiovascular: Normal rate, regular rhythm, normal heart sounds and intact distal pulses. Exam reveals no gallop and no friction rub.  No murmur heard. Pulmonary/Chest: Breath sounds normal. No respiratory distress. He has no wheezes. He has no rales.  Abdominal: Soft. Bowel sounds are  normal. He exhibits no mass. There is no hepatosplenomegaly. There is no tenderness. There is no rebound, no guarding and no CVA tenderness.  Musculoskeletal: Normal range of motion. He exhibits no edema or tenderness.  Lymphadenopathy:    He has no cervical adenopathy.  Neurological: He is alert and oriented to person, place, and time. He has normal strength and normal reflexes. No cranial nerve deficit.  Skin: Skin is warm. No rash noted.  Nursing note and vitals reviewed.     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) - Primary   Relevant Medications   amLODipine (NORVASC) 10 MG tablet   atorvastatin (LIPITOR)  40 MG tablet   lisinopril (PRINIVIL,ZESTRIL) 5 MG tablet   cilostazol (PLETAL) 100 MG tablet   clopidogrel (PLAVIX) 75 MG tablet   aspirin EC 81 MG tablet   Other Relevant Orders   Ambulatory referral to Vascular Surgery   Coronary artery disease involving native coronary artery of native heart   Relevant Medications   amLODipine (NORVASC) 10 MG tablet   atorvastatin (LIPITOR) 40 MG tablet   lisinopril (PRINIVIL,ZESTRIL) 5 MG tablet   clopidogrel (PLAVIX) 75 MG tablet   aspirin EC 81 MG tablet   Benign essential HTN   Relevant Medications   amLODipine (NORVASC) 10 MG tablet   atorvastatin (LIPITOR) 40 MG tablet   lisinopril (PRINIVIL,ZESTRIL) 5 MG tablet   aspirin EC 81 MG tablet     Other   Vitamin D deficiency   Relevant Medications   Vitamin D, Cholecalciferol, 1000 units CAPS   Hyperlipidemia, mixed   Relevant Medications   amLODipine (NORVASC) 10 MG tablet   atorvastatin (LIPITOR) 40 MG tablet   lisinopril (PRINIVIL,ZESTRIL) 5 MG tablet   aspirin EC 81 MG tablet    Other Visit Diagnoses    Cigarette nicotine dependence without complication          Meds ordered this encounter  Medications  . amLODipine (NORVASC) 10 MG tablet    Sig: Take 1 tablet (10 mg total) by mouth daily.    Dispense:  30 tablet    Refill:  2  . DISCONTD: cilostazol (PLETAL) 100 MG tablet    Sig: Take 1 tablet (100 mg total) by mouth 2 (two) times daily.    Dispense:  60 tablet    Refill:  2  . atorvastatin (LIPITOR) 40 MG tablet    Sig: Take 1 tablet (40 mg total) by mouth daily.    Dispense:  30 tablet    Refill:  2  . lisinopril (PRINIVIL,ZESTRIL) 5 MG tablet    Sig: Take 1 tablet (5 mg total) by mouth daily.    Dispense:  30 tablet    Refill:  2  . DISCONTD: clopidogrel (PLAVIX) 75 MG tablet    Sig: Take 1 tablet (75 mg total) by mouth daily.    Dispense:  30 tablet    Refill:  2  . cilostazol (PLETAL) 100 MG tablet    Sig: Take 1 tablet (100 mg total) by mouth 2 (two) times  daily.    Dispense:  60 tablet    Refill:  2  . clopidogrel (PLAVIX) 75 MG tablet    Sig: Take 1 tablet (75 mg total) by mouth daily.    Dispense:  30 tablet    Refill:  2  . Vitamin D, Cholecalciferol, 1000 units CAPS    Sig: Take 1 capsule by mouth daily.    Dispense:  60 capsule    Refill:  3  . aspirin EC 81 MG tablet    Sig: Take 1 tablet (81 mg total) by mouth daily.    Dispense:  90 tablet    Refill:  3      Dr. Otilio Miu [facility]  06/08/17

## 2017-06-08 NOTE — Addendum Note (Signed)
Addended by: Juline Patch on: 06/08/2017 11:33 AM   Modules accepted: Orders

## 2017-06-16 ENCOUNTER — Encounter: Payer: Self-pay | Admitting: Urology

## 2017-06-16 ENCOUNTER — Ambulatory Visit (INDEPENDENT_AMBULATORY_CARE_PROVIDER_SITE_OTHER): Payer: Medicare Other | Admitting: Urology

## 2017-06-16 ENCOUNTER — Other Ambulatory Visit
Admission: RE | Admit: 2017-06-16 | Discharge: 2017-06-16 | Disposition: A | Discharge status: Home / Self Care | Payer: Medicare Other | Source: Ambulatory Visit | Attending: Urology | Admitting: Urology

## 2017-06-16 VITALS — BP 149/67 | HR 72 | Ht 67.0 in | Wt 150.0 lb

## 2017-06-16 DIAGNOSIS — Z8546 Personal history of malignant neoplasm of prostate: Secondary | ICD-10-CM

## 2017-06-16 DIAGNOSIS — R310 Gross hematuria: Secondary | ICD-10-CM | POA: Diagnosis not present

## 2017-06-16 DIAGNOSIS — I70219 Atherosclerosis of native arteries of extremities with intermittent claudication, unspecified extremity: Secondary | ICD-10-CM | POA: Diagnosis not present

## 2017-06-16 DIAGNOSIS — F17200 Nicotine dependence, unspecified, uncomplicated: Secondary | ICD-10-CM

## 2017-06-16 LAB — URINALYSIS, COMPLETE (UACMP) WITH MICROSCOPIC
Bacteria, UA: NONE SEEN
Bilirubin Urine: NEGATIVE
Glucose, UA: NEGATIVE mg/dL
Ketones, ur: NEGATIVE mg/dL
Leukocytes, UA: NEGATIVE
Nitrite: NEGATIVE
Protein, ur: NEGATIVE mg/dL
Specific Gravity, Urine: 1.015 (ref 1.005–1.030)
Squamous Epithelial / HPF: NONE SEEN (ref 0–5)
pH: 5.5 (ref 5.0–8.0)

## 2017-06-16 NOTE — Progress Notes (Signed)
06/16/2017 12:42 PM   Dylan Villanueva Mar 18, 1940 [id number]  Referring provider: Adline Potter, MD No address on file  Chief Complaint  Patient presents with  . Prostate Cancer    New Patient  . Hematuria    HPI: 77 year old male who presents today for further evaluation of gross hematuria and prostate cancer.    He has a personal history of prostate cancer status post XRT and brachytherapy boost in ? 2008.  He reports his PSA stayed under 1 and was checked annually.    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  He had a "touch up" operation.  He is a current smoker.  66 years x 1 ppd.    He does have a significant cardiac history on aspirin, Plavix, and Pletal.  He is previously followed by urologist in [place], Dr. Tally Joe.  Release was previously signed but we have yet to receive the records.  Up with new release was signed again today.  Most recently, he had two episodes of painless gross hematuria with clots.  No associated urianry urgency, frequency, dysuria.     He does have very mild stress incontinence for which he wears a pad. .    No weight loss or bone pain.   PMH: Past Medical History:  Diagnosis Date  . Cancer [facility]) 2006   prostate   . Colon polyps   . Hypertension   . Myocardial infarction ([place])   . Smoker   . Squamous acanthoma of external ear, right     Surgical History: Past Surgical History:  Procedure Laterality Date  . APPENDECTOMY  1980  . CARDIAC SURGERY    . CORONARY ANGIOPLASTY WITH STENT PLACEMENT    . HIGH DOSE RADIATION IMPLANT INSERTION  2006    Home Medications:  Allergies as of 06/16/2017      Reactions   Penicillins Nausea Only      Medication List        Accurate as of 06/16/17 11:59 PM. Always use your most recent med list.          amLODipine 10 MG tablet Commonly known as:  NORVASC Take 1 tablet (10 mg total) by mouth daily.   aspirin EC 81 MG tablet Take 1  tablet (81 mg total) by mouth daily.   atorvastatin 40 MG tablet Commonly known as:  LIPITOR Take 1 tablet (40 mg total) by mouth daily.   cilostazol 100 MG tablet Commonly known as:  PLETAL Take 1 tablet (100 mg total) by mouth 2 (two) times daily.   clopidogrel 75 MG tablet Commonly known as:  PLAVIX Take 1 tablet (75 mg total) by mouth daily.   lisinopril 5 MG tablet Commonly known as:  PRINIVIL,ZESTRIL Take 1 tablet (5 mg total) by mouth daily.   MULTIVITAMIN ADULT PO Take 1 capsule by mouth daily.   Vitamin D (Cholecalciferol) 1000 units Caps Take 1 capsule by mouth daily.       Allergies:  Allergies  Allergen Reactions  . Penicillins Nausea Only    Family History: Family History  Problem Relation Age of Onset  . Cancer Mother        pancreatic  . Dementia Father   . Cancer Sister        breast   . Aneurysm Brother     Social History:  reports that he has been smoking cigarettes.  He has a 66.00 pack-year smoking history. He has never used smokeless tobacco.  He reports that he drinks about 1.8 oz of alcohol per week. He reports that he does not use drugs.  ROS: UROLOGY Frequent Urination?: Yes Hard to postpone urination?: Yes Burning/pain with urination?: No Get up at night to urinate?: No Leakage of urine?: Yes Urine stream starts and stops?: No Trouble starting stream?: No Do you have to strain to urinate?: No Blood in urine?: Yes Urinary tract infection?: No Sexually transmitted disease?: No Injury to kidneys or bladder?: No Painful intercourse?: No Weak stream?: No Erection problems?: Yes Penile pain?: No  Gastrointestinal Nausea?: No Vomiting?: No Indigestion/heartburn?: No Diarrhea?: No Constipation?: No  Constitutional Fever: No Night sweats?: No Weight loss?: No Fatigue?: No  Skin Skin rash/lesions?: No Itching?: No  Eyes Blurred vision?: No Double vision?: No  Ears/Nose/Throat Sore throat?: No Sinus problems?:  Yes  Hematologic/Lymphatic Swollen glands?: No Easy bruising?: Yes  Cardiovascular Leg swelling?: No Chest pain?: No  Respiratory Cough?: Yes Shortness of breath?: No  Endocrine Excessive thirst?: No  Musculoskeletal Back pain?: No Joint pain?: No  Neurological Headaches?: No Dizziness?: No  Psychologic Depression?: No Anxiety?: No  Physical Exam: BP (!) 149/67   Pulse 72   Ht 5\' 7"  (1.702 m)   Wt 150 lb (68 kg)   BMI 23.49 kg/m   Constitutional:  Alert and oriented, No acute distress. HEENT: [place] AT, moist mucus membranes.  Trachea midline, no masses. Cardiovascular: No clubbing, cyanosis, or edema. Respiratory: Normal respiratory effort, no increased work of breathing. GI: Abdomen is soft, nontender, nondistended, no abdominal masses GU: No CVA tenderness Skin: No rashes, bruises or suspicious lesions. Neurologic: Grossly intact, no focal deficits, moving all 4 extremities. Psychiatric: Normal mood and affect.  Laboratory Data: Lab Results  Component Value Date   WBC 7.1 05/08/2017   HGB 15.4 05/08/2017   HCT 44.3 05/08/2017   MCV 97 05/08/2017   PLT 220 05/08/2017    Lab Results  Component Value Date   CREATININE 0.89 05/08/2017    Urinalysis    Component Value Date/Time   COLORURINE YELLOW 06/16/2017 1628   APPEARANCEUR CLEAR 06/16/2017 1628   LABSPEC 1.015 06/16/2017 1628   PHURINE 5.5 06/16/2017 1628   GLUCOSEU NEGATIVE 06/16/2017 1628   HGBUR TRACE (A) 06/16/2017 1628   BILIRUBINUR NEGATIVE 06/16/2017 1628   KETONESUR NEGATIVE 06/16/2017 1628   PROTEINUR NEGATIVE 06/16/2017 1628   NITRITE NEGATIVE 06/16/2017 1628   LEUKOCYTESUR NEGATIVE 06/16/2017 1628    Lab Results  Component Value Date   BACTERIA NONE SEEN 06/16/2017    Pertinent Imaging: No recent upper tract imaging for review  Assessment & Plan:    1. Gross hematuria Recent episodes of painless gross hematuria Although he has a personal history of radiation cystitis  on antibiotic therapy, I do feel it is prudent to proceed with full hematuria work-u given that has beenp several years since his last evaluation We discussed the differential diagnosis including risk of bladder cancer for which he has multiple risk factors including personal history of radiation as well as continued tobacco abuse We discussed evaluation in the form of CT urogram and office cystoscopy, he is agreeable to this plan - CT HEMATURIA WORKUP; Future - Urinalysis, Complete w Microscopic; Future  2. History of prostate cancer Personal history of prostate cancer, no recent PSA for comparison Records have been requested PSA today - PSA; Future  3. Smoker We discussed the causative relationship between smoking and bladder cancer Encourage smoking cessation today  Records requested from urologist  Return in about 1 month (around 07/14/2017) for cystoscopy/ f/u CT urogram.  Hollice Espy, MD  [facility] [address] [place], [place] [address] [phone]

## 2017-06-17 LAB — PSA: Prostatic Specific Antigen: 0.04 ng/mL (ref 0.00–4.00)

## 2017-06-19 ENCOUNTER — Telehealth: Payer: Self-pay

## 2017-06-19 NOTE — Telephone Encounter (Signed)
Patient notified

## 2017-06-19 NOTE — Telephone Encounter (Signed)
-----   Message from Hollice Espy, MD sent at 06/19/2017  3:22 PM EDT ----- PSA is very low but detectable.  PSA 0.04.  Hollice Espy, MD

## 2017-06-22 ENCOUNTER — Encounter (INDEPENDENT_AMBULATORY_CARE_PROVIDER_SITE_OTHER): Payer: Self-pay | Admitting: Vascular Surgery

## 2017-06-22 ENCOUNTER — Ambulatory Visit (INDEPENDENT_AMBULATORY_CARE_PROVIDER_SITE_OTHER): Payer: Medicare Other | Admitting: Vascular Surgery

## 2017-06-22 ENCOUNTER — Other Ambulatory Visit: Payer: Self-pay

## 2017-06-22 VITALS — BP 136/68 | HR 77 | Resp 14 | Ht 67.0 in | Wt 151.0 lb

## 2017-06-22 DIAGNOSIS — I872 Venous insufficiency (chronic) (peripheral): Secondary | ICD-10-CM

## 2017-06-22 DIAGNOSIS — E782 Mixed hyperlipidemia: Secondary | ICD-10-CM

## 2017-06-22 DIAGNOSIS — I70219 Atherosclerosis of native arteries of extremities with intermittent claudication, unspecified extremity: Secondary | ICD-10-CM | POA: Diagnosis not present

## 2017-06-22 DIAGNOSIS — I2510 Atherosclerotic heart disease of native coronary artery without angina pectoris: Secondary | ICD-10-CM | POA: Diagnosis not present

## 2017-06-22 DIAGNOSIS — I10 Essential (primary) hypertension: Secondary | ICD-10-CM

## 2017-06-22 DIAGNOSIS — R310 Gross hematuria: Secondary | ICD-10-CM

## 2017-06-22 NOTE — Progress Notes (Signed)
MRN : [id number]  Dylan Villanueva is a 77 y.o. ([birth year]) male who presents with chief complaint of  Chief Complaint  Patient presents with  . New Patient (Initial Visit)    Claudication  .  History of Present Illness:  The patient is seen for evaluation of painful lower extremities and diminished pulses. Patient notes the pain is always associated with activity and is very consistent day today. Typically, the pain occurs at less than one block, progress is as activity continues to the point that the patient must stop walking. Resting including standing still for several minutes allowed resumption of the activity and the ability to walk a similar distance before stopping again. Uneven terrain and inclined shorten the distance. The pain has been stable over the past several years. The patient states the inability to walk is not having a profound negative impact on quality of life and daily activities.  The patient denies rest pain or dangling of an extremity off the side of the bed during the night for relief. No open wounds or sores at this time. No prior interventions or surgeries.  No history of back problems or DJD of the lumbar sacral spine.   The patient denies changes in claudication symptoms/distance or new rest pain symptoms.  No new ulcers or wounds of the foot.  The patient's blood pressure has been stable and relatively well controlled. The patient denies amaurosis fugax or recent TIA symptoms. There are no recent neurological changes noted. The patient denies history of DVT, PE or superficial thrombophlebitis. The patient denies recent episodes of angina or shortness of breath.   Current Meds  Medication Sig  . amLODipine (NORVASC) 10 MG tablet Take 1 tablet (10 mg total) by mouth daily.  Marland Kitchen aspirin EC 81 MG tablet Take 1 tablet (81 mg total) by mouth daily.  Marland Kitchen atorvastatin (LIPITOR) 40 MG tablet Take 1 tablet (40 mg total) by mouth daily.  . cilostazol (PLETAL) 100 MG  tablet Take 1 tablet (100 mg total) by mouth 2 (two) times daily.  . clopidogrel (PLAVIX) 75 MG tablet Take 1 tablet (75 mg total) by mouth daily.  Marland Kitchen lisinopril (PRINIVIL,ZESTRIL) 5 MG tablet Take 1 tablet (5 mg total) by mouth daily.  . Multiple Vitamins-Minerals (MULTIVITAMIN ADULT PO) Take 1 capsule by mouth daily.  . Vitamin D, Cholecalciferol, 1000 units CAPS Take 1 capsule by mouth daily.    Past Medical History:  Diagnosis Date  . Cancer [facility]) 2006   prostate   . Colon polyps   . Hypertension   . Myocardial infarction ([place])   . Smoker   . Squamous acanthoma of external ear, right     Past Surgical History:  Procedure Laterality Date  . APPENDECTOMY  1980  . CARDIAC SURGERY    . CORONARY ANGIOPLASTY WITH STENT PLACEMENT    . HIGH DOSE RADIATION IMPLANT INSERTION  2006    Social History Social History   Tobacco Use  . Smoking status: Current Every Day Smoker    Packs/day: 1.00    Years: 66.00    Pack years: 66.00    Types: Cigarettes  . Smokeless tobacco: Never Used  . Tobacco comment:      Substance Use Topics  . Alcohol use: Yes    Alcohol/week: 1.8 oz    Types: 3 Cans of beer per week  . Drug use: Never    Family History Family History  Problem Relation Age of Onset  . Cancer Mother  pancreatic  . Dementia Father   . Cancer Sister        breast   . Aneurysm Brother   No family history of bleeding/clotting disorders, porphyria or autoimmune disease   Allergies  Allergen Reactions  . Penicillins Nausea Only     REVIEW OF SYSTEMS (Negative unless checked)  Constitutional: [] Weight loss  [] Fever  [] Chills Cardiac: [] Chest pain   [] Chest pressure   [] Palpitations   [] Shortness of breath when laying flat   [] Shortness of breath with exertion. Vascular:  [x] Pain in legs with walking   [] Pain in legs at rest  [] History of DVT   [] Phlebitis   [] Swelling in legs   [x] Varicose veins   [] Non-healing ulcers Pulmonary:   [] Uses home oxygen    [] Productive cough   [] Hemoptysis   [] Wheeze  [] COPD   [] Asthma Neurologic:  [] Dizziness   [] Seizures   [] History of stroke   [] History of TIA  [] Aphasia   [] Vissual changes   [] Weakness or numbness in arm   [] Weakness or numbness in leg Musculoskeletal:   [] Joint swelling   [] Joint pain   [] Low back pain Hematologic:  [] Easy bruising  [] Easy bleeding   [] Hypercoagulable state   [] Anemic Gastrointestinal:  [] Diarrhea   [] Vomiting  [] Gastroesophageal reflux/heartburn   [] Difficulty swallowing. Genitourinary:  [] Chronic kidney disease   [] Difficult urination  [] Frequent urination   [] Blood in urine Skin:  [x] Rashes   [] Ulcers  Psychological:  [] History of anxiety   []  History of major depression.  Physical Examination  Vitals:   06/22/17 0941  BP: 136/68  Pulse: 77  Resp: 14  Weight: 151 lb (68.5 kg)  Height: 5\' 7"  (1.702 m)   Body mass index is 23.65 kg/m. Gen: WD/WN, NAD Head: [place]/AT, No temporalis wasting.  Ear/Nose/Throat: Hearing grossly intact, nares w/o erythema or drainage, poor dentition Eyes: PER, EOMI, sclera nonicteric.  Neck: Supple, no masses.  No bruit or JVD.  Pulmonary:  Good air movement, clear to auscultation bilaterally, no use of accessory muscles.  Cardiac: RRR, normal S1, S2, no Murmurs. Vascular:  No carotid bruits, scattered small varicosities present bilaterally, mostly at the ankle level.  Mild venous stasis changes to the legs bilaterally.  trace soft pitting edema Vessel Right Left  Radial Palpable Palpable  Brachial Palpable Palpable  Carotid Palpable Palpable  Popliteal Not Palpable Not Palpable  PT Not Palpable Not Palpable  DP Not Palpable Not Palpable  Gastrointestinal: soft, non-distended. No guarding/no peritoneal signs.  Musculoskeletal: M/S 5/5 throughout.  No deformity or atrophy.  Neurologic: CN 2-12 intact. Pain and light touch intact in extremities.  Symmetrical.  Speech is fluent. Motor exam as listed above. Psychiatric: Judgment intact,  Mood & affect appropriate for pt's clinical situation. Dermatologic: mild venous with eczema rashes no ulcers noted.  No changes consistent with cellulitis. Lymph : No Cervical lymphadenopathy, no lichenification or skin changes of chronic lymphedema.  CBC Lab Results  Component Value Date   WBC 7.1 05/08/2017   HGB 15.4 05/08/2017   HCT 44.3 05/08/2017   MCV 97 05/08/2017   PLT 220 05/08/2017    BMET    Component Value Date/Time   NA 142 05/08/2017 1142   K 4.6 05/08/2017 1142   CL 101 05/08/2017 1142   CO2 24 05/08/2017 1142   GLUCOSE 90 05/08/2017 1142   BUN 15 05/08/2017 1142   CREATININE 0.89 05/08/2017 1142   CALCIUM 9.7 05/08/2017 1142   GFRNONAA 82 05/08/2017 1142   GFRAA 95 05/08/2017 1142  CrCl cannot be calculated (Patient's most recent lab result is older than the maximum 21 days allowed.).  COAG No results found for: INR, PROTIME  Radiology No results found.   Assessment/Plan 1. Atherosclerotic peripheral vascular disease with intermittent claudication (HCC)  Recommend:  The patient has evidence of atherosclerosis of the lower extremities with claudication.  The patient does not voice lifestyle limiting changes at this point in time.  Noninvasive studies do not suggest clinically significant change.  No invasive studies, angiography or surgery at this time The patient should continue walking and begin a more formal exercise program.  The patient should continue antiplatelet therapy and aggressive treatment of the lipid abnormalities  No changes in the patient's medications at this time  The patient should continue wearing graduated compression socks 10-15 mmHg strength to control the mild edema.    A total of 60 minutes was spent with this patient and greater than 50% was spent in counseling and coordination of care with the patient.  Discussion included the treatment options for vascular disease including indications for surgery and intervention.   Also discussed is the appropriate timing of treatment.  In addition medical therapy was discussed.  - VAS [place] ABI WITH/WO TBI; Future  2. Coronary artery disease involving native coronary artery of native heart without angina pectoris Continue cardiac and antihypertensive medications as already ordered and reviewed, no changes at this time.  Continue statin as ordered and reviewed, no changes at this time  Nitrates PRN for chest pain   3. Essential hypertension Continue antihypertensive medications as already ordered, these medications have been reviewed and there are no changes at this time.   4. Hyperlipidemia, mixed Continue statin as ordered and reviewed, no changes at this time   5. Chronic venous insufficiency No surgery or intervention at this point in time.    I have had a long discussion with the patient regarding venous insufficiency and why it  causes symptoms. I have discussed with the patient the chronic skin changes that accompany venous insufficiency and the long term sequela such as infection and ulceration.  Patient will begin wearing graduated compression stockings (10/15 mmHg)  on a daily basis a prescription was given. The patient will put the stockings on first thing in the morning and removing them in the evening. The patient is instructed specifically not to sleep in the stockings.    In addition, behavioral modification including several periods of elevation of the lower extremities during the day will be continued. I have demonstrated that proper elevation is a position with the ankles at heart level.  The patient is instructed to begin routine exercise, especially walking on a daily basis      Hortencia Pilar, MD  06/22/2017 12:11 PM

## 2017-06-27 ENCOUNTER — Other Ambulatory Visit: Payer: Self-pay | Admitting: Urology

## 2017-06-27 ENCOUNTER — Other Ambulatory Visit
Admission: RE | Admit: 2017-06-27 | Discharge: 2017-06-27 | Disposition: A | Discharge status: Home / Self Care | Payer: Medicare Other | Source: Ambulatory Visit | Attending: Urology | Admitting: Urology

## 2017-06-27 ENCOUNTER — Ambulatory Visit
Admission: RE | Admit: 2017-06-27 | Discharge: 2017-06-27 | Disposition: A | Discharge status: Home / Self Care | Payer: Medicare Other | Source: Ambulatory Visit | Attending: Urology | Admitting: Urology

## 2017-06-27 DIAGNOSIS — N323 Diverticulum of bladder: Secondary | ICD-10-CM | POA: Insufficient documentation

## 2017-06-27 DIAGNOSIS — R310 Gross hematuria: Secondary | ICD-10-CM

## 2017-06-27 DIAGNOSIS — Q248 Other specified congenital malformations of heart: Secondary | ICD-10-CM | POA: Insufficient documentation

## 2017-06-27 DIAGNOSIS — K319 Disease of stomach and duodenum, unspecified: Secondary | ICD-10-CM | POA: Insufficient documentation

## 2017-06-27 DIAGNOSIS — Z923 Personal history of irradiation: Secondary | ICD-10-CM | POA: Diagnosis not present

## 2017-06-27 DIAGNOSIS — I700 Atherosclerosis of aorta: Secondary | ICD-10-CM | POA: Insufficient documentation

## 2017-06-27 LAB — CREATININE, SERUM
CREATININE: 0.94 mg/dL (ref 0.61–1.24)
GFR calc Af Amer: >60 mL/min (ref >60)

## 2017-06-27 LAB — BUN: BUN: 16 mg/dL (ref 6–20)

## 2017-06-27 MED ORDER — IOPAMIDOL (ISOVUE-300) INJECTION 61%
150.0000 mL | Freq: Once | INTRAVENOUS | Status: AC | PRN
  Administered 2017-06-27: 125 mL via INTRAVENOUS

## 2017-06-28 ENCOUNTER — Telehealth: Payer: Self-pay

## 2017-06-28 NOTE — Telephone Encounter (Signed)
-----   Message from Hollice Espy, MD sent at 06/27/2017  2:55 PM EDT ----- Patient has an incidental lesion near his stomach of unknown significance.  I will review it with the radiologist at tumor board Thursday and get back on how to proceed with further work-up and intervention for this.  No GU pathology identified.    Hollice Espy, MD

## 2017-06-28 NOTE — Telephone Encounter (Signed)
Pt informed, he will wait for Dr. Cherrie Gauze call.

## 2017-06-29 ENCOUNTER — Other Ambulatory Visit: Payer: Self-pay | Admitting: Urology

## 2017-06-30 ENCOUNTER — Telehealth: Payer: Self-pay

## 2017-06-30 ENCOUNTER — Other Ambulatory Visit: Payer: Self-pay

## 2017-06-30 NOTE — Telephone Encounter (Signed)
Received referral from Dr. Erlene Quan for EUS to evaluate gastric lesion found on Ct. Case was discussed in tumor board and Dr. Mont Dutton recommended EUS for further evaluation. EUS scheduled for 5/30 with Dr. Francella Solian at [facility]. Went over instructions and copy mailed to home address. We will obtain permission to hold Plavix from Dr. Ronnald Ramp and notify him regarding. All questions answered. Provided contact information for any future questions.  INSTRUCTIONS FOR ENDOSCOPIC ULTRASOUND -Your procedure has been scheduled for May 30th with Dr. Francella Solian at [facility]. -The hospital may contact you to pre-register over the phone.  -To get your scheduled arrival time, please call the Endoscopy unit at  [phone] between 1-3 p.m. on:  May 29th    -ON THE DAY OF YOU PROCEDURE:   1. If you are scheduled for a morning procedure, nothing to drink after midnight  -If you are scheduled for an afternoon procedure, you may have clear liquids until 5 hours prior  to the procedure but no carbonated drinks or broth  2. NO FOOD THE DAY OF YOUR PROCEDURE  3. You may take your heart, seizure, blood pressure, Parkinson's or breathing medications at  6am with just enough water to get your pills down  4. Do not take any oral Diabetic medications the morning of your procedure.  5. If you are a diabetic and are using insulin, please notify your prescribing physician of this  procedure, as your dose may need to be altered related to not being able to eat or drink.   6. Do not take vitamins, iron, or fish oil for 5 days before your procedure     -On the day of your procedure, come to the [facility] Admitting/Registration desk (First desk on the right) at the scheduled arrival time. You MUST have someone drive you home from your procedure. You must have a responsible adult with a valid driver's license who is on site throughout your entire procedure and who can stay with you for several hours after  your procedure. You may not go home alone in a taxi, shuttle [place] or bus, as the drivers will not be responsible for you.  --If you have any questions please call me at the above contact  Oncology Nurse Navigator Documentation  Navigator Location: [facility] Onc (06/30/17 1100)   )Navigator Encounter Type: Telephone (06/30/17 1100)                         Barriers/Navigation Needs: Coordination of Care (06/30/17 1100)   Interventions: Coordination of Care (06/30/17 1100)   Coordination of Care: EUS (06/30/17 1100)                  Time Spent with Patient: 30 (06/30/17 1100)

## 2017-07-04 ENCOUNTER — Other Ambulatory Visit: Payer: Self-pay

## 2017-07-04 ENCOUNTER — Telehealth: Payer: Self-pay

## 2017-07-04 NOTE — Telephone Encounter (Signed)
Received clearance from Dr. Ronnald Ramp for Plavix hold. Called and instructed Dylan Villanueva to take his Plavix on 5/24 then stop. He will be instructed when to resume following EUS. Read back performed. Oncology Nurse Navigator Documentation  Navigator Location: [facility] (07/04/17 1400)   )Navigator Encounter Type: Telephone (07/04/17 1400) Telephone: Outgoing Call;Education (07/04/17 1400)                               Coordination of Care: EUS (07/04/17 1400)                  Time Spent with Patient: 15 (07/04/17 1400)

## 2017-07-12 ENCOUNTER — Encounter: Payer: Self-pay | Admitting: *Deleted

## 2017-07-13 ENCOUNTER — Ambulatory Visit: Payer: Medicare Other | Admitting: Anesthesiology

## 2017-07-13 ENCOUNTER — Encounter: Payer: Self-pay | Admitting: Anesthesiology

## 2017-07-13 ENCOUNTER — Encounter
Admission: RE | Disposition: A | Discharge status: Home / Self Care | Payer: Self-pay | Source: Ambulatory Visit | Attending: Gastroenterology

## 2017-07-13 ENCOUNTER — Ambulatory Visit
Admission: RE | Admit: 2017-07-13 | Discharge: 2017-07-13 | Disposition: A | Discharge status: Home / Self Care | Payer: Medicare Other | Source: Ambulatory Visit | Attending: Gastroenterology | Admitting: Gastroenterology

## 2017-07-13 DIAGNOSIS — I10 Essential (primary) hypertension: Secondary | ICD-10-CM | POA: Insufficient documentation

## 2017-07-13 DIAGNOSIS — Z79899 Other long term (current) drug therapy: Secondary | ICD-10-CM | POA: Diagnosis not present

## 2017-07-13 DIAGNOSIS — I2510 Atherosclerotic heart disease of native coronary artery without angina pectoris: Secondary | ICD-10-CM | POA: Diagnosis not present

## 2017-07-13 DIAGNOSIS — K3189 Other diseases of stomach and duodenum: Secondary | ICD-10-CM | POA: Diagnosis not present

## 2017-07-13 DIAGNOSIS — F17200 Nicotine dependence, unspecified, uncomplicated: Secondary | ICD-10-CM | POA: Insufficient documentation

## 2017-07-13 DIAGNOSIS — D490 Neoplasm of unspecified behavior of digestive system: Secondary | ICD-10-CM | POA: Diagnosis not present

## 2017-07-13 DIAGNOSIS — I252 Old myocardial infarction: Secondary | ICD-10-CM | POA: Insufficient documentation

## 2017-07-13 DIAGNOSIS — D371 Neoplasm of uncertain behavior of stomach: Secondary | ICD-10-CM | POA: Diagnosis not present

## 2017-07-13 HISTORY — PX: EUS: SHX5427

## 2017-07-13 SURGERY — ULTRASOUND, UPPER GI TRACT, ENDOSCOPIC
Anesthesia: General

## 2017-07-13 MED ORDER — IPRATROPIUM-ALBUTEROL 0.5-2.5 (3) MG/3ML IN SOLN: 3.0000 mL | Freq: Once | RESPIRATORY_TRACT | Status: DC

## 2017-07-13 MED ORDER — FENTANYL CITRATE (PF) 100 MCG/2ML IJ SOLN
INTRAMUSCULAR | Status: AC
  Filled 2017-07-13: qty 2

## 2017-07-13 MED ORDER — SODIUM CHLORIDE 0.9 % IV SOLN
INTRAVENOUS | Status: DC
  Administered 2017-07-13: 1000 mL via INTRAVENOUS

## 2017-07-13 MED ORDER — PROPOFOL 500 MG/50ML IV EMUL
INTRAVENOUS | Status: DC | PRN
  Administered 2017-07-13: 50 ug/kg/min via INTRAVENOUS

## 2017-07-13 MED ORDER — PROPOFOL 10 MG/ML IV BOLUS
INTRAVENOUS | Status: AC
  Filled 2017-07-13: qty 40

## 2017-07-13 MED ORDER — PROPOFOL 10 MG/ML IV BOLUS
INTRAVENOUS | Status: DC | PRN
  Administered 2017-07-13: 50 mg via INTRAVENOUS
  Administered 2017-07-13: 30 mg via INTRAVENOUS
  Administered 2017-07-13: 20 mg via INTRAVENOUS

## 2017-07-13 MED ORDER — FENTANYL CITRATE (PF) 100 MCG/2ML IJ SOLN
INTRAMUSCULAR | Status: DC | PRN
  Administered 2017-07-13 (×4): 25 ug via INTRAVENOUS

## 2017-07-13 NOTE — H&P (Signed)
Outpatient short stay form Pre-procedure 07/13/2017 12:58 PM Dylan Villanueva,  Sammuel Hines, MD  Primary Physician: Juline Patch, MD   Reason for visit:  No chief complaint on file.   No diagnosis found.   History of present illness:  Incidental finding of a hyperdense mass in the stomach on a CT scan. No abd pain.   No current facility-administered medications for this encounter.   Medications Prior to Admission  Medication Sig Dispense Refill Last Dose  . amLODipine (NORVASC) 10 MG tablet Take 1 tablet (10 mg total) by mouth daily. 30 tablet 2 07/13/2017 at 0700  . aspirin EC 81 MG tablet Take 1 tablet (81 mg total) by mouth daily. 90 tablet 3 07/13/2017 at 0700  . atorvastatin (LIPITOR) 40 MG tablet Take 1 tablet (40 mg total) by mouth daily. 30 tablet 2 07/13/2017 at 0700  . cilostazol (PLETAL) 100 MG tablet Take 1 tablet (100 mg total) by mouth 2 (two) times daily. 60 tablet 2 07/13/2017 at 0700  . clopidogrel (PLAVIX) 75 MG tablet Take 1 tablet (75 mg total) by mouth daily. 30 tablet 2 Past Week at Unknown time  . lisinopril (PRINIVIL,ZESTRIL) 5 MG tablet Take 1 tablet (5 mg total) by mouth daily. 30 tablet 2 07/13/2017 at 0700  . Multiple Vitamins-Minerals (MULTIVITAMIN ADULT PO) Take 1 capsule by mouth daily.   Past Week at Unknown time  . Vitamin D, Cholecalciferol, 1000 units CAPS Take 1 capsule by mouth daily. 60 capsule 3 Past Week at Unknown time    Allergies  Allergen Reactions  . Penicillins Nausea Only    Past Medical History:  Diagnosis Date  . Cancer [facility]) 2006   prostate   . Colon polyps   . Hypertension   . Myocardial infarction ([place])   . Smoker   . Squamous acanthoma of external ear, right     Physical Exam      Pertinant exam for procedure: Abd soft NT   I reviewed the CT scan this morning with radiology. Hyperdense prior to IV contrast most c/w densely calcified mass. Very unlikely aneurysm as no change with IV contrast.   I explained to patient may be too  calcified to get tissue. He understands and will proceed.  Planned proceedures: Upper EUS.    Zada Girt MD Gastroenterology 07/13/2017  12:58 PM

## 2017-07-13 NOTE — Transfer of Care (Signed)
Immediate Anesthesia Transfer of Care Note  Patient: Dylan Villanueva  Procedure(s) Performed: FULL UPPER ENDOSCOPIC ULTRASOUND (EUS) RADIAL (N/A )  Patient Location: PACU  Anesthesia Type:General  Level of Consciousness: awake and alert   Airway & Oxygen Therapy: Patient Spontanous Breathing and Patient connected to nasal cannula oxygen  Post-op Assessment: Report given to RN and Post -op Vital signs reviewed and stable  Post vital signs: Reviewed and stable  Last Vitals:  Vitals Value Taken Time  BP 97/53 07/13/2017  3:04 PM  Temp 36.2 C 07/13/2017  3:04 PM  Pulse 64 07/13/2017  3:04 PM  Resp 20 07/13/2017  3:04 PM  SpO2 96 % 07/13/2017  3:04 PM  Vitals shown include unvalidated device data.  Last Pain:  Vitals:   07/13/17 1504  TempSrc: Tympanic  PainSc:          Complications: No apparent anesthesia complications

## 2017-07-13 NOTE — Op Note (Signed)
[facility] Gastroenterology Patient Name: Dylan Villanueva Procedure Date: 07/13/2017 2:17 PM MRN: [id number] Account #: [id number] Date of Birth: 12/14/1940 Admit Type: Outpatient Age: 77 Room: [facility] ENDO ROOM 3 Gender: Male Note Status: Finalized Procedure:            Upper EUS Indications:          Suspected mass in stomach on CT scan, Abnormal                        abdominal/pelvic CT scan Providers:            Zada Girt Referring MD:         Juline Patch, MD (Referring MD) Medicines:            Monitored Anesthesia Care Complications:        No immediate complications. Procedure:            Pre-Anesthesia Assessment:                       - Please see pre-anesthesia assessment documentation                        already completed in Epic.                       After obtaining informed consent, the endoscope was                        passed under direct vision. Throughout the procedure,                        the patient's blood pressure, pulse, and oxygen                        saturations were monitored continuously. The EUS GI                        Linear Array [id number] was introduced through the mouth,                        and advanced to the stomach for ultrasound examination.                        The upper EUS was accomplished without difficulty. The                        patient tolerated the procedure well. The Olympus 190                        Endoscope [id number]) was introduced through the mouth,                        and advanced to the second part of duodenum. Findings:      ENDOSCOPIC FINDING: :      The examined esophagus was endoscopically normal.      A medium-sized, submucosal, non-circumferential mass with no bleeding       and no stigmata of recent bleeding was found in the cardia.      The examined duodenum was endoscopically normal.      ENDOSONOGRAPHIC FINDING: :  A round intramural (subepithelial) lesion was found  in the cardia of the       stomach. The lesion was hypoechoic, heterogenous and calcified.       Sonographically, the lesion appeared to originate from the muscularis       propria (Layer 4) although cannot rule out an extrinsic lesion abutting       layer 4. The lesion measured 31 mm (in maximum thickness). The lesion       also measured 22 mm in diameter. The outer endosonographic borders were       well defined. An intact interface was seen between the mass and the       adjacent structures suggesting a lack of invasion. Fine needle biopsy       was performed. Color Doppler imaging was utilized prior to needle       puncture to confirm a lack of significant vascular structures within the       needle path. Five passes were made with the 22 gauge SharkCore biopsy       needle using a transgastric approach. A preliminary cytologic       examination was performed. Final cytology results are pending.      No abnormal lymph nodes were seen during endosonographic examination in       the celiac region (level 20). Impression:           - Normal esophagus.                       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                       - Normal examined duodenum.                       EUS:                       - An intramural (subepithelial) lesion was found in the                        cardia of the stomach. The lesion appeared to originate                        from within the muscularis propria (Layer 4) although                        difficult to visualize this well due to the                        calcifications and it is possible that the lesion                        arises extrinsic to the stomach and abutts the                        muscularis propria. Tissue was obtained from this exam,                        and results are pending. 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 Fine                        needle biopsy performed. Recommendation:       - Await cytology results.                       - Patient has a contact number available for                        emergencies. The signs and symptoms of potential                        delayed complications were discussed with the patient.                        Return to normal activities tomorrow. Written discharge                        instructions were provided to the patient.                       - Return to referring physician.                       - The findings and recommendations were discussed with                        the patient.                       - The findings and recommendations were discussed with                        the designated responsible adult.                       - The findings and recommendations were discussed with                        the referring physician. Attending Participation:      I personally performed the entire procedure. Zada Girt,  07/13/2017 3:01:57 PM This report has been signed electronically. Number of Addenda: 0 Note Initiated On: 07/13/2017 2:17 PM      [facility]

## 2017-07-13 NOTE — Brief Op Note (Signed)
Fine needle aspiration of gastric mass sent for cytology

## 2017-07-13 NOTE — Anesthesia Post-op Follow-up Note (Signed)
Anesthesia QCDR form completed.        

## 2017-07-13 NOTE — Anesthesia Preprocedure Evaluation (Addendum)
Anesthesia Evaluation  Patient identified by MRN, date of birth, ID band Patient awake    Reviewed: Allergy & Precautions, NPO status , Patient's Chart, lab work & pertinent test results  Airway Mallampati: II  TM Distance: <3 FB     Dental  (+) Upper Dentures   Pulmonary Current Smoker,    Pulmonary exam normal        Cardiovascular hypertension, + CAD, + Past MI and + Peripheral Vascular Disease  Normal cardiovascular exam     Neuro/Psych negative neurological ROS  negative psych ROS   GI/Hepatic Neg liver ROS,   Endo/Other  negative endocrine ROS  Renal/GU negative Renal ROS     Musculoskeletal negative musculoskeletal ROS (+)   Abdominal Normal abdominal exam  (+)   Peds  Hematology negative hematology ROS (+)   Anesthesia Other Findings   Reproductive/Obstetrics                            Anesthesia Physical Anesthesia Plan  ASA: III  Anesthesia Plan: General   Post-op Pain Management:    Induction: Intravenous  PONV Risk Score and Plan:   Airway Management Planned: Nasal Cannula  Additional Equipment:   Intra-op Plan:   Post-operative Plan:   Informed Consent: I have reviewed the patients History and Physical, chart, labs and discussed the procedure including the risks, benefits and alternatives for the proposed anesthesia with the patient or authorized representative who has indicated his/her understanding and acceptance.   Dental advisory given  Plan Discussed with: CRNA and Surgeon  Anesthesia Plan Comments:         Anesthesia Quick Evaluation

## 2017-07-13 NOTE — Anesthesia Postprocedure Evaluation (Signed)
Anesthesia Post Note  Patient: Dylan Villanueva  Procedure(s) Performed: FULL UPPER ENDOSCOPIC ULTRASOUND (EUS) RADIAL (N/A )  Patient location during evaluation: Other Anesthesia Type: General Level of consciousness: awake and alert and oriented Pain management: pain level controlled Vital Signs Assessment: post-procedure vital signs reviewed and stable Respiratory status: spontaneous breathing Cardiovascular status: blood pressure returned to baseline Anesthetic complications: no     Last Vitals:  Vitals:   07/13/17 1508 07/13/17 1518  BP: (!) 94/58 123/61  Pulse: 65 62  Resp: 17 19  Temp:    SpO2: 95% 95%    Last Pain:  Vitals:   07/13/17 1518  TempSrc:   PainSc: 0-No pain                 Anayely Constantine

## 2017-07-17 ENCOUNTER — Other Ambulatory Visit: Payer: Self-pay | Admitting: Pathology

## 2017-07-17 LAB — CYTOLOGY - NON PAP

## 2017-07-18 NOTE — Progress Notes (Signed)
EUS report routed to Dr. Erlene Quan 5/31. Dylan Villanueva has follow up scheduled 6/7 in her office. Oncology Nurse Navigator Documentation  Navigator Location: [facility] (07/18/17 0900)   )Navigator Encounter Type: Diagnostic Results (07/18/17 0900)                                 Coordination of Care: EUS (07/18/17 0900)                  Time Spent with Patient: 15 (07/18/17 0900)

## 2017-07-20 ENCOUNTER — Other Ambulatory Visit: Payer: Self-pay

## 2017-07-20 DIAGNOSIS — R310 Gross hematuria: Secondary | ICD-10-CM

## 2017-07-21 ENCOUNTER — Encounter: Payer: Self-pay | Admitting: Urology

## 2017-07-21 ENCOUNTER — Other Ambulatory Visit
Admission: RE | Admit: 2017-07-21 | Discharge: 2017-07-21 | Disposition: A | Discharge status: Home / Self Care | Payer: Medicare Other | Source: Ambulatory Visit | Attending: Urology | Admitting: Urology

## 2017-07-21 ENCOUNTER — Ambulatory Visit (INDEPENDENT_AMBULATORY_CARE_PROVIDER_SITE_OTHER): Payer: Medicare Other | Admitting: Urology

## 2017-07-21 VITALS — BP 114/63 | HR 79

## 2017-07-21 DIAGNOSIS — I70219 Atherosclerosis of native arteries of extremities with intermittent claudication, unspecified extremity: Secondary | ICD-10-CM | POA: Diagnosis not present

## 2017-07-21 DIAGNOSIS — K319 Disease of stomach and duodenum, unspecified: Secondary | ICD-10-CM

## 2017-07-21 DIAGNOSIS — R310 Gross hematuria: Secondary | ICD-10-CM | POA: Diagnosis not present

## 2017-07-21 DIAGNOSIS — K3189 Other diseases of stomach and duodenum: Secondary | ICD-10-CM

## 2017-07-21 DIAGNOSIS — Z8546 Personal history of malignant neoplasm of prostate: Secondary | ICD-10-CM | POA: Diagnosis not present

## 2017-07-21 DIAGNOSIS — N35912 Unspecified bulbous urethral stricture, male: Secondary | ICD-10-CM

## 2017-07-21 LAB — URINALYSIS, COMPLETE (UACMP) WITH MICROSCOPIC
Bacteria, UA: NONE SEEN
GLUCOSE, UA: NEGATIVE mg/dL
Hgb urine dipstick: NEGATIVE
KETONES UR: 15 mg/dL — AB
LEUKOCYTES UA: NEGATIVE
Nitrite: NEGATIVE
PROTEIN: 30 mg/dL — AB
Specific Gravity, Urine: 1.015 (ref 1.005–1.030)
WBC UA: NONE SEEN WBC/hpf (ref 0–5)
pH: 6.5 (ref 5.0–8.0)

## 2017-07-21 MED ORDER — CIPROFLOXACIN HCL 500 MG PO TABS
500.0000 mg | ORAL_TABLET | Freq: Once | ORAL | Status: AC
  Administered 2017-07-21: 500 mg via ORAL

## 2017-07-21 MED ORDER — LIDOCAINE HCL URETHRAL/MUCOSAL 2 % EX GEL
1.0000 "application " | Freq: Once | CUTANEOUS | Status: AC
  Administered 2017-07-21: 1 via URETHRAL

## 2017-07-21 NOTE — Progress Notes (Signed)
   08/14/17  CC:  Chief Complaint  Patient presents with  . Cysto    HPI: 77 year old male with a history of prostate cancer and gross hematuria who presents today for cystoscopy.    In the interim, he had a CT urogram which showed a lesion within his stomach status post endoscopy with biopsy which shows a neoplasm of unclear etiology, presumably and just.  Surgical consultation for excision was recommended which is communicated to the patient per tumor board recommendations.    Blood pressure 114/63, pulse 79. NED. A&Ox3.   No respiratory distress   Abd soft, NT, ND Normal phallus with bilateral descended testicles  Cystoscopy Procedure Note  Patient identification was confirmed, informed consent was obtained, and patient was prepped using Betadine solution.  Lidocaine jelly was administered per urethral meatus.    Preoperative abx where received prior to procedure.     Pre-Procedure: - Inspection reveals a normal caliber ureteral meatus.  Procedure: The flexible cystoscope was introduced without difficulty -Tight bulbar urethral stricture, able to bypass with 78 [place] Foley but somewhat difficult bilobar coaptation - Enlarged prostate  - Normal bladder neck - Bilateral ureteral orifices identified - Bladder mucosa  reveals no ulcers, tumors, or lesions - No bladder stones - No trabeculation  Retroflexion shows well onset of intravesical prostate treatment, no discrete renal lobe  Post-Procedure: - Patient tolerated the procedure well  Assessment/ Plan:  1. Gross hematuria No GU pathology on CT urogram or cystoscopy today other than bulbar urethral stricture Continue to encourage patient Bleeding likely exacerbated by aspirin/Plavix - ciprofloxacin (CIPRO) tablet 500 mg - lidocaine (XYLOCAINE) 2 % jelly 1 application - PSA; Future  2. History of prostate cancer Most recent PSA 0.04 We will continue to monitor annually  3. Bulbous urethral  stricture Approximately 60 French, otherwise asymptomatic We will continue to monitor with PVR/uroflow and symptom surveillance  4. Gastric mass Referral to general surgery as discussed above -- Ambulatory referral to General Surgery  Follow-up in 1 year with PSA   Hollice Espy, MD

## 2017-08-07 DIAGNOSIS — H35713 Central serous chorioretinopathy, bilateral: Secondary | ICD-10-CM | POA: Diagnosis not present

## 2017-08-09 ENCOUNTER — Encounter: Payer: Self-pay | Admitting: Surgery

## 2017-08-09 ENCOUNTER — Ambulatory Visit (INDEPENDENT_AMBULATORY_CARE_PROVIDER_SITE_OTHER): Payer: Medicare Other | Admitting: Surgery

## 2017-08-09 ENCOUNTER — Telehealth: Payer: Self-pay

## 2017-08-09 VITALS — BP 144/67 | HR 76 | Temp 97.4°F | Ht 68.0 in | Wt 149.0 lb

## 2017-08-09 DIAGNOSIS — C49A2 Gastrointestinal stromal tumor of stomach: Secondary | ICD-10-CM

## 2017-08-09 NOTE — Telephone Encounter (Signed)
Patient wants his surgery to be done in less than 2 weeks because he is scheduled to go on vacation.

## 2017-08-09 NOTE — Telephone Encounter (Signed)
I faxed cardiac and anti-coagulant clearance to Dr. Delilah Shan office. Awaiting for response.

## 2017-08-10 ENCOUNTER — Encounter: Payer: Self-pay | Admitting: Surgery

## 2017-08-10 DIAGNOSIS — I25118 Atherosclerotic heart disease of native coronary artery with other forms of angina pectoris: Secondary | ICD-10-CM | POA: Diagnosis not present

## 2017-08-10 DIAGNOSIS — I10 Essential (primary) hypertension: Secondary | ICD-10-CM | POA: Diagnosis not present

## 2017-08-10 DIAGNOSIS — I70219 Atherosclerosis of native arteries of extremities with intermittent claudication, unspecified extremity: Secondary | ICD-10-CM | POA: Diagnosis not present

## 2017-08-10 DIAGNOSIS — E782 Mixed hyperlipidemia: Secondary | ICD-10-CM | POA: Diagnosis not present

## 2017-08-10 NOTE — Telephone Encounter (Signed)
Dr. Alveria Apley office call and stated that the patient needed to have several tests before they could give [place] cardiac clearance.

## 2017-08-10 NOTE — Progress Notes (Signed)
Patient ID: Dylan Villanueva, male   DOB: [birth year], 77 y.o.   MRN: [id number]  HPI Dylan Villanueva is a 77 y.o. male in consultation at the request of Dr. Francella Solian.and Dr. Erlene Quan. Significant history of peripheral vascular disease and coronary artery disease.  Antiplatelet therapy to include Plavix and aspirin.  He is able to perform more than 4 METS of activity shortness with or chest pain. Initially he was having hematuria couple months ago and she did have a history of prostate cancer this further work-up ordered by CT that I have personally reviewed showing evidence of a 3 cm lesion within the gastric cardia consistent with a gist tumor.  Patient had endoscopic ultrasound with biopsy that I have also personally reviewed the images.  Showing a submucosal lesion of the gastric cardia on the pathology consistent with either a generous or smooth muscle tumor. Denies any symptoms at this time.  Weight loss.  He is a very functional 77 year old male. CBC and CMP were normal He smokes daily 1 PPD  PATHOLOGY: The smears demonstrate prominent calcification and focal small tissue fragments composed of cellular spindle and epithelioid cells. A limited  immunohistochemical stain panel consisting of ckit, DOG-1, actin, and S100 was attempted on the hypocellular cell block. The stains fail to  assist with further subclassification. The differential diagnosis includes GIST, schwannoma, and a smooth muscle tumor.    HPI  Past Medical History:  Diagnosis Date  . Cancer [facility]) 2006   prostate   . Colon polyps   . Hypertension   . Myocardial infarction ([place])   . Smoker   . Squamous acanthoma of external ear, right     Past Surgical History:  Procedure Laterality Date  . APPENDECTOMY  1980  . CARDIAC SURGERY    . CORONARY ANGIOPLASTY WITH STENT PLACEMENT    . EUS N/A 07/13/2017   Procedure: FULL UPPER ENDOSCOPIC ULTRASOUND (EUS) RADIAL;  Surgeon: Jola Schmidt, MD;  Location: [facility] ENDOSCOPY;  Service:  Endoscopy;  Laterality: N/A;  . HIGH DOSE RADIATION IMPLANT INSERTION  2006    Family History  Problem Relation Age of Onset  . Cancer Mother        pancreatic  . Dementia Father   . Cancer Sister        breast   . Aneurysm Brother     Social History Social History   Tobacco Use  . Smoking status: Current Every Day Smoker    Packs/day: 1.00    Years: 66.00    Pack years: 66.00    Types: Cigarettes  . Smokeless tobacco: Never Used  . Tobacco comment:      Substance Use Topics  . Alcohol use: Yes    Alcohol/week: 1.8 oz    Types: 3 Cans of beer per week  . Drug use: Never    Allergies  Allergen Reactions  . Penicillins Nausea Only    Current Outpatient Medications  Medication Sig Dispense Refill  . amLODipine (NORVASC) 10 MG tablet Take 1 tablet (10 mg total) by mouth daily. 30 tablet 2  . aspirin EC 81 MG tablet Take 1 tablet (81 mg total) by mouth daily. 90 tablet 3  . atorvastatin (LIPITOR) 40 MG tablet Take 1 tablet (40 mg total) by mouth daily. 30 tablet 2  . cilostazol (PLETAL) 100 MG tablet Take 1 tablet (100 mg total) by mouth 2 (two) times daily. 60 tablet 2  . clopidogrel (PLAVIX) 75 MG tablet Take 1 tablet (75 mg total) by mouth daily.  30 tablet 2  . lisinopril (PRINIVIL,ZESTRIL) 5 MG tablet Take 1 tablet (5 mg total) by mouth daily. 30 tablet 2  . Multiple Vitamins-Minerals (MULTIVITAMIN ADULT PO) Take 1 capsule by mouth daily.    . Vitamin D, Cholecalciferol, 1000 units CAPS Take 1 capsule by mouth daily. 60 capsule 3   No current facility-administered medications for this visit.      Review of Systems Full ROS  was asked and was negative except for the information on the HPI  Physical Exam Blood pressure (!) 144/67, pulse 76, temperature (!) 97.4 F (36.3 C), temperature source Oral, height 5\' 8"  (1.727 m), weight 67.6 kg (149 lb). CONSTITUTIONAL: NAD EYES: Pupils are equal, round, and reactive to light, Sclera are non-icteric. EARS, NOSE, MOUTH  AND THROAT: The oropharynx is clear. The oral mucosa is pink and moist. Hearing is intact to voice. LYMPH NODES:  Lymph nodes in the neck are normal. RESPIRATORY:  Lungs are clear. There is normal respiratory effort, with equal breath sounds bilaterally, and without pathologic use of accessory muscles. CARDIOVASCULAR: Heart is regular without murmurs, gallops, or rubs. GI: The abdomen is soft, nontender, and nondistended. There are no palpable masses. There is no hepatosplenomegaly. There are normal bowel sounds in all quadrants. GU: Rectal deferred.   MUSCULOSKELETAL: Normal muscle strength and tone. No cyanosis or edema.   SKIN: Turgor is good and there are no pathologic skin lesions or ulcers. NEUROLOGIC: Motor and sensation is grossly normal. Cranial nerves are grossly intact. PSYCH:  Oriented to person, place and time. Affect is normal.  Data Reviewed  I have personally reviewed the patient's imaging, laboratory findings and medical records.    Assessment/Plan 3 cm size on gastric cardia neoplam, submucosal consistent with a gist tumor. Patient about his disease process.  I do recommend excision of mass versus potentially subtotal gastrectomy. Patient detail about the procedure attempted approach laparoscopy and possibly margins intraoperative sugar this is a just neoplasms.  Possibility and low threshold for conversion to open. Possible complications including but not bleeding, infection,, issues with gastric  Post-vagotomy issues, re-intervention and anastomotic leak case a gastric resection. He understands and wishes to proceed. Need cardiology clearance on anesthesia consultation preop. We will obtain current labs and type and cross him for 2 units RBC. A copy of this report will be sent to the referring provider   We  Will post him for laparoscopic gastric mass resection possible open possible gastrectomy, We will need EGD scope intraoperatively to guide our surgery   Caroleen Hamman, MD FACS General Surgeon 08/10/2017, 9:21 AM

## 2017-08-29 DIAGNOSIS — I25118 Atherosclerotic heart disease of native coronary artery with other forms of angina pectoris: Secondary | ICD-10-CM | POA: Diagnosis not present

## 2017-08-30 DIAGNOSIS — I70219 Atherosclerosis of native arteries of extremities with intermittent claudication, unspecified extremity: Secondary | ICD-10-CM | POA: Diagnosis not present

## 2017-08-30 DIAGNOSIS — I25118 Atherosclerotic heart disease of native coronary artery with other forms of angina pectoris: Secondary | ICD-10-CM | POA: Diagnosis not present

## 2017-08-30 DIAGNOSIS — I10 Essential (primary) hypertension: Secondary | ICD-10-CM | POA: Diagnosis not present

## 2017-08-30 DIAGNOSIS — E782 Mixed hyperlipidemia: Secondary | ICD-10-CM | POA: Diagnosis not present

## 2017-08-30 DIAGNOSIS — I6523 Occlusion and stenosis of bilateral carotid arteries: Secondary | ICD-10-CM | POA: Insufficient documentation

## 2017-08-30 DIAGNOSIS — I6522 Occlusion and stenosis of left carotid artery: Secondary | ICD-10-CM | POA: Diagnosis not present

## 2017-08-30 NOTE — Telephone Encounter (Signed)
Since patient saw Dr. Nehemiah Massed today 08/30/2017, I went ahead and faxed a cardiac and anti-coagulant clearance to his office. Awaiting on response.

## 2017-08-31 NOTE — Telephone Encounter (Signed)
We have received patients clearance to stop anti-coagulant 7 das prior to surgery, we have also received cardiac clearance for surgery from Dr. Serafina Royals. Clearance has been placed in patients chart.

## 2017-09-01 ENCOUNTER — Telehealth: Payer: Self-pay | Admitting: Surgery

## 2017-09-01 NOTE — Telephone Encounter (Signed)
Patient is calling he is ready to schedule his surgery. Please call patient and schedule.

## 2017-09-05 NOTE — Telephone Encounter (Signed)
Patients calling again wanting to get schedule for surgery please call patient and advise.

## 2017-09-05 NOTE — Telephone Encounter (Signed)
Patient has an appointment with Dr Dahlia Byes on 09/27/17 to discuss surgery date.

## 2017-10-02 ENCOUNTER — Ambulatory Visit (INDEPENDENT_AMBULATORY_CARE_PROVIDER_SITE_OTHER): Payer: Medicare Other | Admitting: Surgery

## 2017-10-02 ENCOUNTER — Encounter: Payer: Self-pay | Admitting: Surgery

## 2017-10-02 ENCOUNTER — Other Ambulatory Visit (HOSPITAL_COMMUNITY): Payer: Self-pay | Admitting: Interventional Radiology

## 2017-10-02 ENCOUNTER — Telehealth: Payer: Self-pay

## 2017-10-02 VITALS — BP 134/68 | HR 88 | Temp 98.1°F | Ht 68.0 in | Wt 151.0 lb

## 2017-10-02 DIAGNOSIS — I70219 Atherosclerosis of native arteries of extremities with intermittent claudication, unspecified extremity: Secondary | ICD-10-CM | POA: Diagnosis not present

## 2017-10-02 DIAGNOSIS — C49A2 Gastrointestinal stromal tumor of stomach: Secondary | ICD-10-CM | POA: Diagnosis not present

## 2017-10-02 NOTE — Telephone Encounter (Signed)
Cardiac Clearance faxed to [facility] at this time.

## 2017-10-02 NOTE — Progress Notes (Signed)
Outpatient Surgical Follow Up  10/02/2017  Dylan Villanueva is an 77 y.o. male.   Chief Complaint  Patient presents with  . Consult    Discuss surgery    HPI: Dylan Villanueva here for follow-up he has a history of gist tumor discovered incidentally.  In addition to that he had significant coronary artery disease and was seen by cardiology who ordered stress test as well as an echocardiogram a few weeks ago.  I have personally reviewed both of the studies showing preserved ejection fraction without evidence of valvular disease or reversible ischemia.  He is doing well denies any weight loss any chest pain or any shortness of breath.  He is on dual antiplatelet therapy to include aspirin and Plavix.  Patient had endoscopic ulltrasound with biopsy that I have also personally reviewed .  Showing a submucosal lesion of the gastric cardia on the pathology consistent with either a  GIST , schwannoma or smooth muscle tumor. He is very functional   Past Medical History:  Diagnosis Date  . Cancer [facility]) 2006   prostate   . Colon polyps   . Hypertension   . Myocardial infarction ([place])   . Smoker   . Squamous acanthoma of external ear, right     Past Surgical History:  Procedure Laterality Date  . APPENDECTOMY  1980  . CARDIAC SURGERY    . CORONARY ANGIOPLASTY WITH STENT PLACEMENT    . EUS N/A 07/13/2017   Procedure: FULL UPPER ENDOSCOPIC ULTRASOUND (EUS) RADIAL;  Surgeon: Jola Schmidt, MD;  Location: [facility] ENDOSCOPY;  Service: Endoscopy;  Laterality: N/A;  . HIGH DOSE RADIATION IMPLANT INSERTION  2006    Family History  Problem Relation Age of Onset  . Cancer Mother        pancreatic  . Dementia Father   . Cancer Sister        breast   . Aneurysm Brother     Social History:  reports that he has been smoking cigarettes. He has a 66.00 pack-year smoking history. He has never used smokeless tobacco. He reports that he drinks about 3.0 standard drinks of alcohol per week. He reports that he does not  use drugs.  Allergies:  Allergies  Allergen Reactions  . Penicillins Nausea Only    Medications reviewed.    ROS Full ROS performed and is otherwise negative other than what is stated in HPI   BP 134/68   Pulse 88   Temp 98.1 F (36.7 C) Comment (Src): temp  Ht 5\' 8"  (1.727 m)   Wt 151 lb (68.5 kg)   BMI 22.96 kg/m   Physical Exam  Constitutional: He is oriented to person, place, and time. He appears well-developed and well-nourished. No distress.  Neck: No JVD present. No tracheal deviation present. No thyromegaly present.  Cardiovascular: Normal rate and regular rhythm.  Pulmonary/Chest: Effort normal and breath sounds normal. No stridor. No respiratory distress. He has no wheezes.  Abdominal: Soft. He exhibits no distension and no mass. There is no tenderness. There is no rebound and no guarding.  Neurological: He is alert and oriented to person, place, and time. He displays normal reflexes. No cranial nerve deficit. He exhibits normal muscle tone. Coordination normal.  Skin: Skin is warm and dry. Capillary refill takes less than 2 seconds. He is not diaphoretic.  Nursing note and vitals reviewed.  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  He completed his cardiology evaluation showing no evidence of any reversible cardiac issues.  I had a lengthy discussion with the patient about the surgery and I do think that he is a good candidate for worst resection of the tumor with the possibility of a subtotal gastrectomy that might be done open.  I have also discussed with the patient in detail about his prognosis and disease process.  He wishes to wait for a few weeks before having this performed given that he is going to [place] for a wedding anniversary of his best friends. We will schedule for lap partial gastrectomy possible  open and we will use intraop EGD scope to  Help locate the tumor. Extensive counseling provided. Discussed with patient detail.  Risk benefit and possible complications including but not limited to: Bleeding, infection, anastomotic leak, vagotomy syndromes.  He understands and wishes to proceed   Greater than 50% of the 25 minutes  visit was spent in counseling/coordination of care   Caroleen Hamman, MD [place] Surgeon

## 2017-10-02 NOTE — Patient Instructions (Addendum)
Stop Plavix 7 days prior to surgery.( Clopidogrel ) WE will send Cardiac Clearance to [facility] today.   We will plan your surgery for November 07 2017 You will pre admit at the hospital we will call with the date and time

## 2017-10-03 NOTE — Telephone Encounter (Signed)
Cardiac Clearance obtained from [facility]. Low risk. Patient optimized for surgery.

## 2017-10-18 ENCOUNTER — Other Ambulatory Visit: Payer: Self-pay | Admitting: Family Medicine

## 2017-10-30 ENCOUNTER — Inpatient Hospital Stay: Admission: RE | Admit: 2017-10-30 | Payer: Medicare Other | Source: Ambulatory Visit

## 2017-10-30 ENCOUNTER — Telehealth: Payer: Self-pay

## 2017-10-30 ENCOUNTER — Telehealth: Payer: Self-pay | Admitting: Surgery

## 2017-10-30 NOTE — Telephone Encounter (Signed)
Patient is calling and is asking about a medication that he needs to stop before surgery. Please call patient and advise.

## 2017-10-30 NOTE — Telephone Encounter (Signed)
Called patient back to let him know that he needed to stop his Plavix on 11/02/2017. I also told him that he will receive a phone call from Pre-Admit to reschedule his appointment since he stated that he did not know about. Patient had no further questions.

## 2017-10-30 NOTE — Telephone Encounter (Signed)
The patient will pre admit at the hospital on 11/02/17 at 1:45 pm. He is aware of date, time, and instructions.

## 2017-11-02 ENCOUNTER — Encounter
Admission: RE | Admit: 2017-11-02 | Discharge: 2017-11-02 | Disposition: A | Discharge status: Home / Self Care | Payer: Medicare Other | Source: Ambulatory Visit | Attending: Surgery | Admitting: Surgery

## 2017-11-02 ENCOUNTER — Other Ambulatory Visit: Payer: Self-pay

## 2017-11-02 DIAGNOSIS — I10 Essential (primary) hypertension: Secondary | ICD-10-CM | POA: Insufficient documentation

## 2017-11-02 DIAGNOSIS — I252 Old myocardial infarction: Secondary | ICD-10-CM | POA: Diagnosis not present

## 2017-11-02 DIAGNOSIS — Z01818 Encounter for other preprocedural examination: Secondary | ICD-10-CM | POA: Insufficient documentation

## 2017-11-02 DIAGNOSIS — C49A2 Gastrointestinal stromal tumor of stomach: Secondary | ICD-10-CM | POA: Insufficient documentation

## 2017-11-02 LAB — COMPREHENSIVE METABOLIC PANEL
ALK PHOS: 54 U/L (ref 38–126)
ALT: 20 U/L (ref 0–44)
ANION GAP: 9 (ref 5–15)
AST: 24 U/L (ref 15–41)
Albumin: 4.4 g/dL (ref 3.5–5.0)
BUN: 12 mg/dL (ref 8–23)
CALCIUM: 9.2 mg/dL (ref 8.9–10.3)
CHLORIDE: 103 mmol/L (ref 98–111)
CO2: 26 mmol/L (ref 22–32)
Creatinine, Ser: 0.88 mg/dL (ref 0.61–1.24)
GFR calc non Af Amer: >60 mL/min (ref >60)
Glucose, Bld: 101 mg/dL — ABNORMAL HIGH (ref 70–99)
Potassium: 3.8 mmol/L (ref 3.5–5.1)
SODIUM: 138 mmol/L (ref 135–145)
Total Bilirubin: 0.5 mg/dL (ref 0.3–1.2)
Total Protein: 7.1 g/dL (ref 6.5–8.1)

## 2017-11-02 LAB — APTT: APTT: 29 s (ref 24–36)

## 2017-11-02 LAB — PROTIME-INR
INR: 0.98
Prothrombin Time: 12.9 seconds (ref 11.4–15.2)

## 2017-11-02 NOTE — Patient Instructions (Signed)
Your procedure is scheduled on: Tuesday 11/07/17  Report to [facility]. To find out your arrival time please call [phone] between 1PM - 3PM on Monday 11/06/17  Remember: Instructions that are not followed completely may result in serious medical risk, up to and including death, or upon the discretion of your surgeon and anesthesiologist your surgery may need to be rescheduled.     _X__ 1. Do not eat food after midnight the night before your procedure.                 No gum chewing or hard candies. You may drink clear liquids up to 2 hours                 before you are scheduled to arrive for your surgery- DO NOT drink clear                 liquids within 2 hours of the start of your surgery.                 Clear Liquids include:  water, apple juice without pulp, clear carbohydrate                 drink such as Clearfast or Gatorade, Black Coffee or Tea (Do not add                 anything to coffee or tea).  __X__2.  On the morning of surgery brush your teeth with toothpaste and water, you may rinse your mouth with mouthwash if you wish.  Do not swallow any toothpaste or mouthwash.     _X__ 3.  No Alcohol for 24 hours before or after surgery.   _X__ 4.  Do Not Smoke or use e-cigarettes For 24 Hours Prior to Your Surgery.                 Do not use any chewable tobacco products for at least 6 hours prior to                 surgery.  ____  5.  Bring all medications with you on the day of surgery if instructed.   __X__  6.  Notify your doctor if there is any change in your medical condition      (cold, fever, infections).     Do not wear jewelry, make-up, hairpins, clips or nail polish. Do not wear lotions, powders, or perfumes.  Do not shave 48 hours prior to surgery. Men may shave face and neck. Do not bring valuables to the hospital.    [facility] is not responsible for any belongings or valuables.  Contacts,  dentures/partials or body piercings may not be worn into surgery. Bring a case for your contacts, glasses or hearing aids, a denture cup will be supplied. Leave your suitcase in the car. After surgery it may be brought to your room. For patients admitted to the hospital, discharge time is determined by your treatment team.   Patients discharged the day of surgery will not be allowed to drive home.   Please read over the following fact sheets that you were given:   MRSA Information  __X__ Take these medicines the morning of surgery with A SIP OF WATER:     1. amLODipine (NORVASC) 10 MG tablet  2. atorvastatin (LIPITOR) 40 MG tablet   3.   4.   5.  6.  __X__ Use CHG Soap  as directed  __X__ Stop Blood Thinners Coumadin/Plavix/Xarelto/Pletal/Pradaxa/Eliquis/Effient 3 DAYS PRIOR TO SURGERY. TAKE THE LAST DOSE OF PLETAL (CILOSTAZOL) ON FRIDAY 11/03/17  __X__ Stop Anti-inflammatories TODAY such as Advil, Ibuprofen, Motrin, BC or Goodies Powder, Naprosyn, Naproxen, Aleve, Aspirin, Meloxicam. May take Tylenol if needed for pain or discomfort.   __X__ Stop all herbal supplements, fish oil or vitamin E until after surgery.

## 2017-11-06 ENCOUNTER — Other Ambulatory Visit: Payer: Self-pay | Admitting: Family Medicine

## 2017-11-06 MED ORDER — SODIUM CHLORIDE 0.9 % IV SOLN
2.0000 g | INTRAVENOUS | Status: AC
  Administered 2017-11-07: 2 g via INTRAVENOUS
  Filled 2017-11-06: qty 2

## 2017-11-07 ENCOUNTER — Inpatient Hospital Stay: Payer: Medicare Other

## 2017-11-07 ENCOUNTER — Other Ambulatory Visit: Payer: Self-pay

## 2017-11-07 ENCOUNTER — Encounter
Admission: RE | Disposition: A | Discharge status: Home / Self Care | Payer: Self-pay | Source: Ambulatory Visit | Attending: Surgery

## 2017-11-07 ENCOUNTER — Inpatient Hospital Stay
Admission: RE | Admit: 2017-11-07 | Discharge: 2017-11-08 | DRG: 983 | Disposition: A | Discharge status: Home / Self Care | Payer: Medicare Other | Source: Ambulatory Visit | Attending: Surgery | Admitting: Surgery

## 2017-11-07 ENCOUNTER — Encounter: Payer: Self-pay | Admitting: *Deleted

## 2017-11-07 DIAGNOSIS — I252 Old myocardial infarction: Secondary | ICD-10-CM | POA: Diagnosis not present

## 2017-11-07 DIAGNOSIS — Z803 Family history of malignant neoplasm of breast: Secondary | ICD-10-CM

## 2017-11-07 DIAGNOSIS — Z955 Presence of coronary angioplasty implant and graft: Secondary | ICD-10-CM

## 2017-11-07 DIAGNOSIS — K3189 Other diseases of stomach and duodenum: Secondary | ICD-10-CM

## 2017-11-07 DIAGNOSIS — I2510 Atherosclerotic heart disease of native coronary artery without angina pectoris: Secondary | ICD-10-CM | POA: Diagnosis present

## 2017-11-07 DIAGNOSIS — I10 Essential (primary) hypertension: Secondary | ICD-10-CM | POA: Diagnosis not present

## 2017-11-07 DIAGNOSIS — K319 Disease of stomach and duodenum, unspecified: Secondary | ICD-10-CM | POA: Diagnosis not present

## 2017-11-07 DIAGNOSIS — C49A2 Gastrointestinal stromal tumor of stomach: Secondary | ICD-10-CM | POA: Diagnosis not present

## 2017-11-07 DIAGNOSIS — F17210 Nicotine dependence, cigarettes, uncomplicated: Secondary | ICD-10-CM | POA: Diagnosis present

## 2017-11-07 DIAGNOSIS — Z800 Family history of malignant neoplasm of digestive organs: Secondary | ICD-10-CM

## 2017-11-07 DIAGNOSIS — R1907 Generalized intra-abdominal and pelvic swelling, mass and lump: Secondary | ICD-10-CM

## 2017-11-07 DIAGNOSIS — D371 Neoplasm of uncertain behavior of stomach: Secondary | ICD-10-CM | POA: Diagnosis not present

## 2017-11-07 HISTORY — PX: ESOPHAGOGASTRODUODENOSCOPY: SHX5428

## 2017-11-07 HISTORY — PX: LAPAROSCOPIC REMOVAL ABDOMINAL MASS: SHX6360

## 2017-11-07 LAB — CBC
HCT: 40.6 % (ref 40.0–52.0)
Hemoglobin: 14.2 g/dL (ref 13.0–18.0)
MCH: 35.7 pg — AB (ref 26.0–34.0)
MCHC: 35.1 g/dL (ref 32.0–36.0)
MCV: 101.6 fL — ABNORMAL HIGH (ref 80.0–100.0)
Platelets: 253 10*3/uL (ref 150–440)
RBC: 3.99 MIL/uL — ABNORMAL LOW (ref 4.40–5.90)
RDW: 13.7 % (ref 11.5–14.5)
WBC: 14.3 10*3/uL — ABNORMAL HIGH (ref 3.8–10.6)

## 2017-11-07 LAB — CREATININE, SERUM
CREATININE: 0.95 mg/dL (ref 0.61–1.24)
GFR calc Af Amer: >60 mL/min (ref >60)

## 2017-11-07 LAB — ABO/RH: ABO/RH(D): B POS

## 2017-11-07 SURGERY — REMOVAL, MASS, ABDOMEN, LAPAROSCOPIC
Anesthesia: General

## 2017-11-07 MED ORDER — FAMOTIDINE 20 MG PO TABS
ORAL_TABLET | ORAL | Status: AC
  Filled 2017-11-07: qty 1

## 2017-11-07 MED ORDER — ONDANSETRON HCL 4 MG/2ML IJ SOLN: 4.0000 mg | Freq: Four times a day (QID) | INTRAMUSCULAR | Status: DC | PRN

## 2017-11-07 MED ORDER — LACTATED RINGERS IV SOLN
INTRAVENOUS | Status: DC | PRN
  Administered 2017-11-07: 08:00:00 via INTRAVENOUS

## 2017-11-07 MED ORDER — MORPHINE SULFATE (PF) 2 MG/ML IV SOLN: 2.0000 mg | INTRAVENOUS | Status: DC | PRN

## 2017-11-07 MED ORDER — GABAPENTIN 300 MG PO CAPS
300.0000 mg | ORAL_CAPSULE | ORAL | Status: AC
  Administered 2017-11-07: 300 mg via ORAL

## 2017-11-07 MED ORDER — PROCHLORPERAZINE MALEATE 10 MG PO TABS
10.0000 mg | ORAL_TABLET | Freq: Four times a day (QID) | ORAL | Status: DC | PRN
  Filled 2017-11-07: qty 1

## 2017-11-07 MED ORDER — FENTANYL CITRATE (PF) 100 MCG/2ML IJ SOLN
25.0000 ug | INTRAMUSCULAR | Status: DC | PRN
  Administered 2017-11-07 (×5): 25 ug via INTRAVENOUS

## 2017-11-07 MED ORDER — ROCURONIUM BROMIDE 100 MG/10ML IV SOLN
INTRAVENOUS | Status: DC | PRN
  Administered 2017-11-07: 50 mg via INTRAVENOUS
  Administered 2017-11-07: 20 mg via INTRAVENOUS

## 2017-11-07 MED ORDER — BUPIVACAINE LIPOSOME 1.3 % IJ SUSP
INTRAMUSCULAR | Status: AC
  Filled 2017-11-07: qty 20

## 2017-11-07 MED ORDER — OXYCODONE HCL 5 MG PO TABS: 5.0000 mg | ORAL_TABLET | ORAL | Status: DC | PRN

## 2017-11-07 MED ORDER — GABAPENTIN 300 MG PO CAPS
ORAL_CAPSULE | ORAL | Status: AC
  Filled 2017-11-07: qty 1

## 2017-11-07 MED ORDER — CELECOXIB 200 MG PO CAPS
200.0000 mg | ORAL_CAPSULE | ORAL | Status: AC
  Administered 2017-11-07: 200 mg via ORAL

## 2017-11-07 MED ORDER — ONDANSETRON 4 MG PO TBDP: 4.0000 mg | ORAL_TABLET | Freq: Four times a day (QID) | ORAL | Status: DC | PRN

## 2017-11-07 MED ORDER — LIDOCAINE HCL (PF) 2 % IJ SOLN
INTRAMUSCULAR | Status: AC
  Filled 2017-11-07: qty 10

## 2017-11-07 MED ORDER — FENTANYL CITRATE (PF) 100 MCG/2ML IJ SOLN
INTRAMUSCULAR | Status: AC
  Administered 2017-11-07: 25 ug via INTRAVENOUS
  Filled 2017-11-07: qty 2

## 2017-11-07 MED ORDER — FENTANYL CITRATE (PF) 100 MCG/2ML IJ SOLN
INTRAMUSCULAR | Status: AC
  Filled 2017-11-07: qty 2

## 2017-11-07 MED ORDER — LIDOCAINE HCL (CARDIAC) PF 100 MG/5ML IV SOSY
PREFILLED_SYRINGE | INTRAVENOUS | Status: DC | PRN
  Administered 2017-11-07: 100 mg via INTRAVENOUS

## 2017-11-07 MED ORDER — EPHEDRINE SULFATE 50 MG/ML IJ SOLN
INTRAMUSCULAR | Status: AC
  Filled 2017-11-07: qty 1

## 2017-11-07 MED ORDER — SUGAMMADEX SODIUM 200 MG/2ML IV SOLN
INTRAVENOUS | Status: DC | PRN
  Administered 2017-11-07: 200 mg via INTRAVENOUS

## 2017-11-07 MED ORDER — DEXAMETHASONE SODIUM PHOSPHATE 10 MG/ML IJ SOLN
INTRAMUSCULAR | Status: DC | PRN
  Administered 2017-11-07: 10 mg via INTRAVENOUS

## 2017-11-07 MED ORDER — ONDANSETRON HCL 4 MG/2ML IJ SOLN
INTRAMUSCULAR | Status: DC | PRN
  Administered 2017-11-07: 4 mg via INTRAVENOUS

## 2017-11-07 MED ORDER — DEXAMETHASONE SODIUM PHOSPHATE 10 MG/ML IJ SOLN
INTRAMUSCULAR | Status: AC
  Filled 2017-11-07: qty 1

## 2017-11-07 MED ORDER — SUGAMMADEX SODIUM 200 MG/2ML IV SOLN
INTRAVENOUS | Status: AC
  Filled 2017-11-07: qty 2

## 2017-11-07 MED ORDER — BUPIVACAINE-EPINEPHRINE (PF) 0.25% -1:200000 IJ SOLN
INTRAMUSCULAR | Status: AC
  Filled 2017-11-07: qty 30

## 2017-11-07 MED ORDER — ROCURONIUM BROMIDE 50 MG/5ML IV SOLN
INTRAVENOUS | Status: AC
  Filled 2017-11-07: qty 1

## 2017-11-07 MED ORDER — PROPOFOL 10 MG/ML IV BOLUS
INTRAVENOUS | Status: AC
  Filled 2017-11-07: qty 20

## 2017-11-07 MED ORDER — KETOROLAC TROMETHAMINE 15 MG/ML IJ SOLN
15.0000 mg | Freq: Four times a day (QID) | INTRAMUSCULAR | Status: DC
  Administered 2017-11-07 – 2017-11-08 (×4): 15 mg via INTRAVENOUS
  Filled 2017-11-07 (×4): qty 1

## 2017-11-07 MED ORDER — CELECOXIB 200 MG PO CAPS
ORAL_CAPSULE | ORAL | Status: AC
  Filled 2017-11-07: qty 1

## 2017-11-07 MED ORDER — ACETAMINOPHEN 500 MG PO TABS
1000.0000 mg | ORAL_TABLET | Freq: Four times a day (QID) | ORAL | Status: DC
  Administered 2017-11-07 – 2017-11-08 (×4): 1000 mg via ORAL
  Filled 2017-11-07 (×4): qty 2

## 2017-11-07 MED ORDER — PHENYLEPHRINE HCL 10 MG/ML IJ SOLN
INTRAMUSCULAR | Status: DC | PRN
  Administered 2017-11-07 (×2): 100 ug via INTRAVENOUS
  Administered 2017-11-07: 200 ug via INTRAVENOUS
  Administered 2017-11-07: 100 ug via INTRAVENOUS

## 2017-11-07 MED ORDER — BUPIVACAINE LIPOSOME 1.3 % IJ SUSP: 20.0000 mL | Freq: Once | INTRAMUSCULAR | Status: DC

## 2017-11-07 MED ORDER — CHLORHEXIDINE GLUCONATE CLOTH 2 % EX PADS: 6.0000 | MEDICATED_PAD | Freq: Once | CUTANEOUS | Status: DC

## 2017-11-07 MED ORDER — LACTATED RINGERS IV SOLN
INTRAVENOUS | Status: DC
  Administered 2017-11-07 (×2): via INTRAVENOUS

## 2017-11-07 MED ORDER — FENTANYL CITRATE (PF) 100 MCG/2ML IJ SOLN
INTRAMUSCULAR | Status: DC | PRN
  Administered 2017-11-07: 100 ug via INTRAVENOUS
  Administered 2017-11-07: 50 ug via INTRAVENOUS

## 2017-11-07 MED ORDER — ENOXAPARIN SODIUM 40 MG/0.4ML ~~LOC~~ SOLN
40.0000 mg | SUBCUTANEOUS | Status: DC
  Administered 2017-11-08: 40 mg via SUBCUTANEOUS
  Filled 2017-11-07: qty 0.4

## 2017-11-07 MED ORDER — ACETAMINOPHEN 500 MG PO TABS
ORAL_TABLET | ORAL | Status: AC
  Filled 2017-11-07: qty 2

## 2017-11-07 MED ORDER — EPHEDRINE SULFATE 50 MG/ML IJ SOLN
INTRAMUSCULAR | Status: DC | PRN
  Administered 2017-11-07: 5 mg via INTRAVENOUS
  Administered 2017-11-07 (×2): 10 mg via INTRAVENOUS

## 2017-11-07 MED ORDER — PROPOFOL 10 MG/ML IV BOLUS
INTRAVENOUS | Status: DC | PRN
  Administered 2017-11-07: 150 mg via INTRAVENOUS

## 2017-11-07 MED ORDER — PANTOPRAZOLE SODIUM 40 MG PO TBEC
40.0000 mg | DELAYED_RELEASE_TABLET | Freq: Every day | ORAL | Status: DC
  Administered 2017-11-07 – 2017-11-08 (×2): 40 mg via ORAL
  Filled 2017-11-07 (×2): qty 1

## 2017-11-07 MED ORDER — ONDANSETRON HCL 4 MG/2ML IJ SOLN
INTRAMUSCULAR | Status: AC
  Filled 2017-11-07: qty 2

## 2017-11-07 MED ORDER — FAMOTIDINE 20 MG PO TABS
20.0000 mg | ORAL_TABLET | Freq: Once | ORAL | Status: AC
  Administered 2017-11-07: 20 mg via ORAL

## 2017-11-07 MED ORDER — LACTATED RINGERS IV SOLN
INTRAVENOUS | Status: DC
  Administered 2017-11-07: 07:00:00 via INTRAVENOUS

## 2017-11-07 MED ORDER — BUPIVACAINE-EPINEPHRINE 0.25% -1:200000 IJ SOLN
INTRAMUSCULAR | Status: DC | PRN
  Administered 2017-11-07: 30 mL

## 2017-11-07 MED ORDER — BUPIVACAINE LIPOSOME 1.3 % IJ SUSP
INTRAMUSCULAR | Status: DC | PRN
  Administered 2017-11-07: 20 mL

## 2017-11-07 MED ORDER — HYDRALAZINE HCL 20 MG/ML IJ SOLN: 10.0000 mg | INTRAMUSCULAR | Status: DC | PRN

## 2017-11-07 MED ORDER — GLYCOPYRROLATE 0.2 MG/ML IJ SOLN
INTRAMUSCULAR | Status: DC | PRN
  Administered 2017-11-07: .2 mg via INTRAVENOUS

## 2017-11-07 MED ORDER — SUCCINYLCHOLINE CHLORIDE 20 MG/ML IJ SOLN
INTRAMUSCULAR | Status: AC
  Filled 2017-11-07: qty 1

## 2017-11-07 MED ORDER — PROCHLORPERAZINE EDISYLATE 10 MG/2ML IJ SOLN
5.0000 mg | Freq: Four times a day (QID) | INTRAMUSCULAR | Status: DC | PRN
  Filled 2017-11-07: qty 2

## 2017-11-07 MED ORDER — ACETAMINOPHEN 500 MG PO TABS
1000.0000 mg | ORAL_TABLET | ORAL | Status: AC
  Administered 2017-11-07: 1000 mg via ORAL

## 2017-11-07 MED ORDER — PHENYLEPHRINE HCL 10 MG/ML IJ SOLN
INTRAMUSCULAR | Status: AC
  Filled 2017-11-07: qty 1

## 2017-11-07 SURGICAL SUPPLY — 60 items
APPLICATOR COTTON TIP 6 STRL (MISCELLANEOUS) ×1 IMPLANT
APPLICATOR COTTON TIP 6IN STRL (MISCELLANEOUS) ×3
BAG URINE DRAINAGE (UROLOGICAL SUPPLIES) ×3 IMPLANT
BLADE SURG 15 STRL LF DISP TIS (BLADE) ×1 IMPLANT
BLADE SURG 15 STRL SS (BLADE) ×2
CANISTER SUCT 1200ML W/VALVE (MISCELLANEOUS) ×3 IMPLANT
CHLORAPREP W/TINT 26ML (MISCELLANEOUS) ×3 IMPLANT
DERMABOND ADVANCED (GAUZE/BANDAGES/DRESSINGS) ×4
DERMABOND ADVANCED .7 DNX12 (GAUZE/BANDAGES/DRESSINGS) ×2 IMPLANT
DRAPE INCISE IOBAN 66X45 STRL (DRAPES) ×3 IMPLANT
DRAPE LAPAROTOMY 100X77 ABD (DRAPES) ×3 IMPLANT
DRSG TELFA 4X3 1S NADH ST (GAUZE/BANDAGES/DRESSINGS) ×3 IMPLANT
ELECT REM PT RETURN 9FT ADLT (ELECTROSURGICAL) ×3
ELECTRODE REM PT RTRN 9FT ADLT (ELECTROSURGICAL) ×1 IMPLANT
GAUZE SPONGE 4X4 12PLY STRL (GAUZE/BANDAGES/DRESSINGS) IMPLANT
GLOVE BIO SURGEON STRL SZ7 (GLOVE) ×18 IMPLANT
GLOVE INDICATOR 7.5 STRL GRN (GLOVE) ×9 IMPLANT
GOWN STRL REUS W/ TWL LRG LVL3 (GOWN DISPOSABLE) ×5 IMPLANT
GOWN STRL REUS W/TWL LRG LVL3 (GOWN DISPOSABLE) ×10
GRASPER SUT TROCAR 14GX15 (MISCELLANEOUS) ×3 IMPLANT
IRRIGATION STRYKERFLOW (MISCELLANEOUS) ×1 IMPLANT
IRRIGATOR STRYKERFLOW (MISCELLANEOUS) ×3
KIT DEFENDO VALVE AND CONN (KITS) ×3 IMPLANT
KIT PINK PAD W/HEAD ARE REST (MISCELLANEOUS) ×3
KIT PINK PAD W/HEAD ARM REST (MISCELLANEOUS) ×1 IMPLANT
LABEL OR SOLS (LABEL) ×3 IMPLANT
NEEDLE HYPO 22GX1.5 SAFETY (NEEDLE) ×3 IMPLANT
NS IRRIG 1000ML POUR BTL (IV SOLUTION) ×3 IMPLANT
PACK BASIN MINOR ARMC (MISCELLANEOUS) ×3 IMPLANT
PACK LAP CHOLECYSTECTOMY (MISCELLANEOUS) ×3 IMPLANT
PENCIL ELECTRO HAND CTR (MISCELLANEOUS) ×3 IMPLANT
PLUG CATH AND CAP STER (CATHETERS) IMPLANT
RELOAD STAPLER GREEN 60MM (STAPLE) ×2 IMPLANT
SLEEVE ENDOPATH XCEL 5M (ENDOMECHANICALS) ×9 IMPLANT
SPONGE LAP 18X18 RF (DISPOSABLE) ×3 IMPLANT
STAPLE ECHEON FLEX 60 POW ENDO (STAPLE) ×3 IMPLANT
STAPLER RELOAD GREEN 60MM (STAPLE) ×6
STAPLER SKIN PROX 35W (STAPLE) IMPLANT
SUT ETHILON 3-0 FS-10 30 BLK (SUTURE)
SUT MNCRL 4-0 (SUTURE) ×4
SUT MNCRL 4-0 27XMFL (SUTURE) ×2
SUT PDS AB 0 CT1 27 (SUTURE) ×6 IMPLANT
SUT PDS AB 1 TP1 96 (SUTURE) IMPLANT
SUT SILK 2 0 SH CR/8 (SUTURE) IMPLANT
SUT VIC AB 2-0 SH 27 (SUTURE) ×8
SUT VIC AB 2-0 SH 27XBRD (SUTURE) ×4 IMPLANT
SUT VICRYL 0 AB UR-6 (SUTURE) ×6 IMPLANT
SUTURE EHLN 3-0 FS-10 30 BLK (SUTURE) IMPLANT
SUTURE MNCRL 4-0 27XMF (SUTURE) ×2 IMPLANT
SYR 10ML SLIP (SYRINGE) ×3 IMPLANT
SYR 20CC LL (SYRINGE) ×3 IMPLANT
SYR 30ML LL (SYRINGE) ×3 IMPLANT
SYS LAPSCP GELPORT 120MM (MISCELLANEOUS) ×3
SYSTEM LAPSCP GELPORT 120MM (MISCELLANEOUS) ×1 IMPLANT
TRAY FOLEY MTR SLVR 16FR STAT (SET/KITS/TRAYS/PACK) ×3 IMPLANT
TROCAR XCEL 12X100 BLDLESS (ENDOMECHANICALS) ×3 IMPLANT
TROCAR XCEL BLUNT TIP 100MML (ENDOMECHANICALS) ×3 IMPLANT
TROCAR XCEL NON-BLD 5MMX100MML (ENDOMECHANICALS) ×6 IMPLANT
TUBE MOSS GAS 18FR (TUBING) IMPLANT
TUBING INSUFFLATION (TUBING) ×3 IMPLANT

## 2017-11-07 NOTE — Anesthesia Procedure Notes (Signed)
Procedure Name: Intubation Date/Time: 11/07/2017 7:41 AM Performed by: Womer, Rex Kras, RN Pre-anesthesia Checklist: Patient identified, Patient being monitored, Timeout performed, Emergency Drugs available and Suction available Patient Re-evaluated:Patient Re-evaluated prior to induction Oxygen Delivery Method: Circle system utilized Preoxygenation: Pre-oxygenation with 100% oxygen Induction Type: IV induction Ventilation: Mask ventilation without difficulty Laryngoscope Size: Mac and 4 Grade View: Grade I Tube type: Oral Tube size: 7.5 mm Number of attempts: 1 Airway Equipment and Method: Stylet Placement Confirmation: ETT inserted through vocal cords under direct vision,  positive ETCO2 and breath sounds checked- equal and bilateral Secured at: 22 cm Tube secured with: Tape Dental Injury: Teeth and Oropharynx as per pre-operative assessment

## 2017-11-07 NOTE — Transfer of Care (Signed)
Immediate Anesthesia Transfer of Care Note  Patient: Dylan Villanueva  Procedure(s) Performed: LAPAROSCOPIC GASTRIC MASS RESECTION (N/A ) ESOPHAGOGASTRODUODENOSCOPY (EGD) (N/A )  Patient Location: PACU  Anesthesia Type:General  Level of Consciousness: sedated  Airway & Oxygen Therapy: Patient Spontanous Breathing and Patient connected to face mask oxygen  Post-op Assessment: Report given to RN and Post -op Vital signs reviewed and stable  Post vital signs: Reviewed  Last Vitals:  Vitals Value Taken Time  BP 133/71 11/07/2017 10:42 AM  Temp    Pulse 79 11/07/2017 10:42 AM  Resp 15 11/07/2017 10:42 AM  SpO2 100 % 11/07/2017 10:42 AM  Vitals shown include unvalidated device data.  Last Pain:  Vitals:   11/07/17 0621  TempSrc: Tympanic  PainSc: 0-No pain         Complications: No apparent anesthesia complications

## 2017-11-07 NOTE — H&P (Signed)
Chief Complaint  Patient presents with  . Consult    Discuss surgery    HPI: Mr Dylan Villanueva here for follow-up he has a history of gist tumor discovered incidentally.  In addition to that he had significant coronary artery disease and was seen by cardiology who ordered stress test as well as an echocardiogram a few weeks ago.  I have personally reviewed both of the studies showing preserved ejection fraction without evidence of valvular disease or reversible ischemia.  He is doing well denies any weight loss any chest pain or any shortness of breath.  He is on dual antiplatelet therapy to include aspirin and Plavix. Patient had endoscopic ulltrasound with biopsy that I have also personally reviewed . Showing a submucosal lesion of the gastric cardia on the pathology consistent with either a  GIST , schwannoma or smooth muscle tumor. He is very functional       Past Medical History:  Diagnosis Date  . Cancer [facility]) 2006   prostate   . Colon polyps   . Hypertension   . Myocardial infarction ([place])   . Smoker   . Squamous acanthoma of external ear, right          Past Surgical History:  Procedure Laterality Date  . APPENDECTOMY  1980  . CARDIAC SURGERY    . CORONARY ANGIOPLASTY WITH STENT PLACEMENT    . EUS N/A 07/13/2017   Procedure: FULL UPPER ENDOSCOPIC ULTRASOUND (EUS) RADIAL;  Surgeon: Jola Schmidt, MD;  Location: [facility] ENDOSCOPY;  Service: Endoscopy;  Laterality: N/A;  . HIGH DOSE RADIATION IMPLANT INSERTION  2006         Family History  Problem Relation Age of Onset  . Cancer Mother        pancreatic  . Dementia Father   . Cancer Sister        breast   . Aneurysm Brother     Social History:  reports that he has been smoking cigarettes. He has a 66.00 pack-year smoking history. He has never used smokeless tobacco. He reports that he drinks about 3.0 standard drinks of alcohol per week. He reports that he does not use drugs.  Allergies:       Allergies  Allergen Reactions  . Penicillins Nausea Only    Medications reviewed.    ROS Full ROS performed and is otherwise negative other than what is stated in HPI  Physical Exam  Constitutional: He is oriented to person, place, and time. He appears well-developed and well-nourished. No distress.  Neck: No JVD present. No tracheal deviation present. No thyromegaly present.  Cardiovascular: Normal rate and regular rhythm.  Pulmonary/Chest: Effort normal and breath sounds normal. No stridor. No respiratory distress. He has no wheezes.  Abdominal: Soft. He exhibits no distension and no mass. There is no tenderness. There is no rebound and no guarding.  Neurological: He is alert and oriented to person, place, and time. He displays normal reflexes. No cranial nerve deficit. He exhibits normal muscle tone. Coordination normal.  Skin: Skin is warm and dry. Capillary refill takes less than 2 seconds. He is not diaphoretic.  Nursing note and vitals reviewed.  Assessment/Plan:  1. 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  He completed his cardiology evaluation showing no evidence of any reversible cardiac issues.  I had a lengthy discussion with the patient about the surgery and I do think that  he is a good candidate for worst resection of the tumor with the possibility of a subtotal gastrectomy that might be done open.  I have also discussed with the patient in detail about his prognosis and disease process.Discussed with patient detail.  Risk benefit and possible complications including but not limited to: Bleeding, infection, anastomotic leak, vagotomy syndromes.  He understands and wishes to proceed   Greater than 50% of the 25 minutes  visit was spent in counseling/coordination of care   Caroleen Hamman, MD [place] Surgeon

## 2017-11-07 NOTE — Op Note (Signed)
Laparoscopic Hand assisted resection of gastric mass and EGD  Pre-operative Diagnosis: Gastric mass  Post-operative Diagnosis: Significant regression of gastric mass  Procedure:  Hand assisted Laparoscopic wedge resection of gastric mass ( cardia of the stomach and anterior wall)  Surgeon: Caroleen Hamman, MD FACS  Anesthesia: Gen. with endotracheal tube  Assistant:Dr. Oaks ( required due to the complexity of the case , for exposure and for concomitant EGD and laparoscopic procedure   Findings: Nodule on gastric cardia on anterior wall.  This has been significant involution of submucosal mass.  Repeated EGD showed only evidence of a scar within the previous lesion.  Laparoscopy was also equivocal to localize the mass therefore I had to use my hand to provide adequate tactile feedback to locate the nodule  Estimated Blood Loss: 20cc         Drains: none         Specimens: Gastric mass         Complications: none   Procedure Details  The patient was seen again in the Holding Room. The benefits, complications, treatment options, and expected outcomes were discussed with the patient. The risks of bleeding, infection, recurrence of symptoms, failure to resolve symptoms,leak from staple line, bowel injury, any of which could require further surgery . The likelihood of improving the patient's symptoms with return to their baseline status is good.  The patient and/or family concurred with the proposed plan, giving informed consent.  The patient was taken to Operating Room, identified as Osiel Stick and the procedure verified.  A Time Out was held and the above information confirmed.  Prior to the induction of general anesthesia, antibiotic prophylaxis was administered. VTE prophylaxis was in place. General endotracheal anesthesia was then administered and tolerated well. After the induction, the abdomen was prepped with Chloraprep and draped in the sterile fashion. The patient was positioned in  the sodomy position with all the padding with him pressure points. Foley catheter was placed and actually we had to place a coud given that the first time there was some resistance.  Were able to pass a coud catheter without any issues.  Start with EGD and passed the scope through the esophagus and stomach.  There was just a discrete thickening and scar tissue within the cardia of the stomach.  There was no evidence of a subcutaneous mass. Because of equivocal endoscopic findings I decided to start a diagnostic laparoscopy.  Cut down technique was used to enter the abdominal cavity and a Hasson trochar was placed after two vicryl stitches were anchored to the fascia. Pneumoperitoneum was then created with CO2 and tolerated well without any adverse changes in the patient's vital signs.  Three 5-mm ports were placed in the  upper abdomen all under direct vision Additional 12 mm trocar was placed in the left upper quadrant under direct vision.  The patient was positioned  in reverse Trendelenburg, using the pars flaccida approach the gastrohepatic ligament was divided with the harmonic scalpel.  We were able to dissect all the way to the right and to the left crew.  Circumferential dissection of the scotoma was performed.  Paraesophageal ligaments were also divided and we preserve the vagus nerve at all times.  Attention then was turned to the greater curvature where the short gastrics were divided with harmonic scalpel in the standard fashion.  We were able to obtain circumferential dissection along the GE junction and around the hiatus.  I was able to see a thickening of the antrum  where the previous tumor was.  Because of the cuticle findings I felt that I needed to palpate the region to make sure that I was resecting the lesion.  We were able to extend her incision and created a 7 cm incision above the umbilicus and the GelPort was placed.  I placed my hand and was able to feel a significant nodularity  along the of the stomach and anterior wall.  There was evidence of a nodule and thickening within the gastric wall.  A 2-0 Vicryl stitch was placed to mark this nodule.  We were able to elevate the fundus of the stomach and using a 60 mm green load Echelon laparoscopic staple device resected the nodule.  Please note that we left the EGD scope to make sure we did not narrow the lumen of the esophagus or the stomach.  I repeated the EGD show no evidence of any intra-abdominal bleed and no evidence of any strictures.  We were able to insufflate the stomach and there was no evidence of any staple line leak. Second look show no evidence of bowel injuries or active bleeding.  Laparoscopic ports were removed and  Pneumoperitoneum was released.  Fascia was closed with a running 0 PDS suture. Liposomal Marcaine was used for postoperative analgesia infiltrated along all incisions.  Marland Kitchen 4-0 subcuticular Monocryl was used to close the skin. Dermabond was  applied.  The patient was then extubated and brought to the recovery room in stable condition. Sponge, lap, and needle counts were correct at closure and at the conclusion of the case.               Caroleen Hamman, MD, FACS

## 2017-11-07 NOTE — Anesthesia Postprocedure Evaluation (Signed)
Anesthesia Post Note  Patient: Dylan Villanueva  Procedure(s) Performed: LAPAROSCOPIC GASTRIC MASS RESECTION (N/A ) ESOPHAGOGASTRODUODENOSCOPY (EGD) (N/A )  Patient location during evaluation: PACU Anesthesia Type: General Level of consciousness: awake and alert Pain management: pain level controlled Vital Signs Assessment: post-procedure vital signs reviewed and stable Respiratory status: spontaneous breathing, nonlabored ventilation, respiratory function stable and patient connected to nasal cannula oxygen Cardiovascular status: blood pressure returned to baseline and stable Postop Assessment: no apparent nausea or vomiting Anesthetic complications: no     Last Vitals:  Vitals:   11/07/17 1120 11/07/17 1125  BP:  124/63  Pulse:  62  Resp:  10  Temp:    SpO2: 96% 93%    Last Pain:  Vitals:   11/07/17 1130  TempSrc:   PainSc: 4                  Precious Haws Piscitello

## 2017-11-07 NOTE — Anesthesia Post-op Follow-up Note (Signed)
Anesthesia QCDR form completed.        

## 2017-11-07 NOTE — Anesthesia Preprocedure Evaluation (Signed)
Anesthesia Evaluation  Patient identified by MRN, date of birth, ID band Patient awake    Reviewed: Allergy & Precautions, H&P , NPO status , Patient's Chart, lab work & pertinent test results  History of Anesthesia Complications Negative for: history of anesthetic complications  Airway Mallampati: III  TM Distance: <3 FB Neck ROM: limited    Dental  (+) Chipped, Upper Dentures   Pulmonary neg shortness of breath, COPD, Current Smoker,           Cardiovascular Exercise Tolerance: Good hypertension, (-) angina+ CAD, + Past MI, + Cardiac Stents and + Peripheral Vascular Disease  (-) DOE      Neuro/Psych negative neurological ROS  negative psych ROS   GI/Hepatic negative GI ROS, Neg liver ROS, neg GERD  ,  Endo/Other  negative endocrine ROS  Renal/GU      Musculoskeletal   Abdominal   Peds  Hematology negative hematology ROS (+)   Anesthesia Other Findings Past Medical History: 2006: Cancer ([place])     Comment:  prostate  No date: Colon polyps No date: Hypertension No date: Myocardial infarction ([place]) No date: Smoker 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, right  Past Surgical History: 1980: APPENDECTOMY No date: CARDIAC SURGERY No date: CORONARY ANGIOPLASTY WITH STENT PLACEMENT 07/13/2017: EUS; N/A     Comment:  Procedure: FULL UPPER ENDOSCOPIC ULTRASOUND (EUS)               RADIAL;  Surgeon: Jola Schmidt, MD;  Location: [facility]               ENDOSCOPY;  Service: Endoscopy;  Laterality: N/A; 2006: HIGH DOSE RADIATION IMPLANT INSERTION  BMI    Body Mass Index:  23.32 kg/m      Reproductive/Obstetrics negative OB ROS                             Anesthesia Physical Anesthesia Plan  ASA: III  Anesthesia Plan: General ETT   Post-op Pain Management:    Induction: Intravenous  PONV Risk Score and Plan: Ondansetron, Dexamethasone, Midazolam and Treatment may vary due to age  or medical condition  Airway Management Planned: Oral ETT  Additional Equipment:   Intra-op Plan:   Post-operative Plan: Extubation in OR  Informed Consent: I have reviewed the patients History and Physical, chart, labs and discussed the procedure including the risks, benefits and alternatives for the proposed anesthesia with the patient or authorized representative who has indicated his/her understanding and acceptance.   Dental Advisory Given  Plan Discussed with: Anesthesiologist, CRNA and Surgeon  Anesthesia Plan Comments: (Patient consented for risks of anesthesia including but not limited to:  - adverse reactions to medications - damage to teeth, lips or other oral mucosa - sore throat or hoarseness - Damage to heart, brain, lungs or loss of life  Patient voiced understanding.)        Anesthesia Quick Evaluation

## 2017-11-07 NOTE — Progress Notes (Signed)
Pt. ambulated x 1 during the shift. Tolerating full liquid. Has not asked extra pain med other than scheduled.

## 2017-11-08 ENCOUNTER — Encounter: Payer: Self-pay | Admitting: Surgery

## 2017-11-08 ENCOUNTER — Ambulatory Visit: Payer: Medicare Other | Admitting: Family Medicine

## 2017-11-08 DIAGNOSIS — I10 Essential (primary) hypertension: Secondary | ICD-10-CM | POA: Diagnosis present

## 2017-11-08 DIAGNOSIS — Z803 Family history of malignant neoplasm of breast: Secondary | ICD-10-CM | POA: Diagnosis not present

## 2017-11-08 DIAGNOSIS — C49A2 Gastrointestinal stromal tumor of stomach: Secondary | ICD-10-CM | POA: Diagnosis present

## 2017-11-08 DIAGNOSIS — I252 Old myocardial infarction: Secondary | ICD-10-CM | POA: Diagnosis not present

## 2017-11-08 DIAGNOSIS — F17210 Nicotine dependence, cigarettes, uncomplicated: Secondary | ICD-10-CM | POA: Diagnosis present

## 2017-11-08 DIAGNOSIS — Z800 Family history of malignant neoplasm of digestive organs: Secondary | ICD-10-CM | POA: Diagnosis not present

## 2017-11-08 DIAGNOSIS — Z955 Presence of coronary angioplasty implant and graft: Secondary | ICD-10-CM | POA: Diagnosis not present

## 2017-11-08 DIAGNOSIS — I2510 Atherosclerotic heart disease of native coronary artery without angina pectoris: Secondary | ICD-10-CM | POA: Diagnosis present

## 2017-11-08 MED ORDER — OXYCODONE HCL 5 MG PO TABS: 5.0000 mg | ORAL_TABLET | Freq: Four times a day (QID) | ORAL | 0 refills | Status: DC | PRN

## 2017-11-08 NOTE — Discharge Summary (Signed)
Discharge Summary  Patient ID: Dylan Villanueva MRN: [id number] DOB/AGE: 1940/11/19 78 y.o.  Admit date: 11/07/2017 Discharge date: 11/08/2017  Discharge Diagnoses Gastric Mass  Consultants None  Procedures Laparoscopic Hand-assisted Resection of Gastric Mass and EGD on 11/07/17 with Dr. Dahlia Byes, MD  HPI: Reno Clasby is a 77 y.o. male with a history of gist tumor discovered incidentally. He underwent EUS biopsy which showed a pathology consistent with either a gist tumor, schwannoma, or smooth muscle tumor. He presented 11/07/17 for resection of that tumor.   Hospital Course: He underwent Laparoscopic Hand-assisted Resection of Gastric Mass and EGD on 11/07/17 with Dr. Dahlia Byes, MD. His post-operative period was unremarkable. His diet was advanced without remarkable events. ON POD1, he was tolerating a diet, his pain was adequately controlled, and he was mobilizing well and stable for discharge. He will follow up in 2 weeks with Dr. Dahlia Byes and he understands to avoid carbonated drinks until cleared by Dr. Dahlia Byes. All of his questions and concerns were addressed and answered.   Physical Exam:  CONST: well-appearing male, NAD PULM: Normal Effort, No respiratory Distress ABD: Soft, Non-tender, non-distended, laparoscopic incisions are CDI without erythema or drainage.  MSK: No peripheral edema PYSCH: Normal mood, A&Ox3    Allergies as of 11/08/2017      Reactions   Penicillins Nausea Only   Has patient had a PCN reaction causing immediate rash, facial/tongue/throat swelling, SOB or lightheadedness with hypotension: no Has patient had a PCN reaction causing severe rash involving mucus membranes or skin necrosis: no Has patient had a PCN reaction that required hospitalization: no Has patient had a PCN reaction occurring within the last 10 years: no If all of the above answers are "NO", then may proceed with Cephalosporin use.      Medication List    TAKE these medications   amLODipine 10 MG  tablet Commonly known as:  NORVASC Take 1 tablet (10 mg total) by mouth daily.   aspirin EC 81 MG tablet Take 1 tablet (81 mg total) by mouth daily.   atorvastatin 40 MG tablet Commonly known as:  LIPITOR Take 1 tablet (40 mg total) by mouth daily.   cilostazol 100 MG tablet Commonly known as:  PLETAL Take 1 tablet (100 mg total) by mouth 2 (two) times daily.   clopidogrel 75 MG tablet Commonly known as:  PLAVIX Take 1 tablet (75 mg total) by mouth daily.   lisinopril 5 MG tablet Commonly known as:  PRINIVIL,ZESTRIL Take 1 tablet (5 mg total) by mouth daily.   MULTIVITAMIN ADULT PO Take 1 capsule by mouth daily.   oxyCODONE 5 MG immediate release tablet Commonly known as:  Oxy IR/ROXICODONE Take 1 tablet (5 mg total) by mouth every 6 (six) hours as needed for severe pain.   Vitamin D (Cholecalciferol) 1000 units Caps Take 1 capsule by mouth daily.        Follow-up Information    Pabon, [place] F, MD. Schedule an appointment as soon as possible for a visit in 2 week(s).   Specialty:  General Surgery Why:  s/p gastric mass resection on 09/24, 2 weeks follow up appointment  Contact information: [address] [place] [address] [phone]           Signed: Edison Simon , PA-C [facility]  11/08/2017, 8:30 AM [phone] M-F: 7am - 4pm

## 2017-11-09 LAB — TYPE AND SCREEN
ABO/RH(D): B POS
Antibody Screen: NEGATIVE
UNIT DIVISION: 0
Unit division: 0

## 2017-11-09 LAB — BPAM RBC
BLOOD PRODUCT EXPIRATION DATE: 201910222359
BLOOD PRODUCT EXPIRATION DATE: 201910222359
UNIT TYPE AND RH: 5100
Unit Type and Rh: 5100

## 2017-11-09 LAB — PREPARE RBC (CROSSMATCH)

## 2017-11-10 LAB — SURGICAL PATHOLOGY

## 2017-11-12 DIAGNOSIS — R2689 Other abnormalities of gait and mobility: Secondary | ICD-10-CM | POA: Diagnosis not present

## 2017-11-12 DIAGNOSIS — I10 Essential (primary) hypertension: Secondary | ICD-10-CM | POA: Diagnosis not present

## 2017-11-12 DIAGNOSIS — Z48815 Encounter for surgical aftercare following surgery on the digestive system: Secondary | ICD-10-CM | POA: Diagnosis not present

## 2017-11-13 ENCOUNTER — Other Ambulatory Visit: Payer: Self-pay

## 2017-11-13 ENCOUNTER — Telehealth: Payer: Self-pay

## 2017-11-13 ENCOUNTER — Telehealth: Payer: Self-pay | Admitting: *Deleted

## 2017-11-13 ENCOUNTER — Other Ambulatory Visit: Payer: Self-pay | Admitting: Family Medicine

## 2017-11-13 DIAGNOSIS — I10 Essential (primary) hypertension: Secondary | ICD-10-CM | POA: Diagnosis not present

## 2017-11-13 DIAGNOSIS — R2689 Other abnormalities of gait and mobility: Secondary | ICD-10-CM | POA: Diagnosis not present

## 2017-11-13 DIAGNOSIS — Z48815 Encounter for surgical aftercare following surgery on the digestive system: Secondary | ICD-10-CM | POA: Diagnosis not present

## 2017-11-13 MED ORDER — LISINOPRIL 5 MG PO TABS: 5.0000 mg | ORAL_TABLET | Freq: Every day | ORAL | 0 refills | Status: DC

## 2017-11-13 MED ORDER — ATORVASTATIN CALCIUM 40 MG PO TABS: 40.0000 mg | ORAL_TABLET | Freq: Every day | ORAL | 0 refills | Status: DC

## 2017-11-13 NOTE — Telephone Encounter (Signed)
-----   Message from Jules Husbands, MD sent at 11/13/2017  8:43 AM EDT ----- Please let the pt know that the tumor was removed completely. No invasive cancer and no need for further therapy. F/U as previously scheduled. ----- Message ----- From: Interface, Lab In Three Zero One Sent: 11/10/2017  12:41 PM EDT To: Jules Husbands, MD

## 2017-11-13 NOTE — Telephone Encounter (Signed)
Flagged on EMMI report for caregiver answering they weren't sure if patient was taking medications.  Called and spoke with patient who reports he was able to get all his regular medications and has no problems with them.  No other questions or concerns at this time.  I thanked him for his time and informed him that he would receive one more automated call sometime today checking on him.

## 2017-11-13 NOTE — Progress Notes (Unsigned)
Refilled atorvastatin and lisinopril- see pt in Oct

## 2017-11-13 NOTE — Telephone Encounter (Signed)
Notified patient as instructed, patient pleased. Discussed follow-up appointments, patient agrees  

## 2017-11-16 DIAGNOSIS — Z48815 Encounter for surgical aftercare following surgery on the digestive system: Secondary | ICD-10-CM | POA: Diagnosis not present

## 2017-11-16 DIAGNOSIS — I10 Essential (primary) hypertension: Secondary | ICD-10-CM | POA: Diagnosis not present

## 2017-11-16 DIAGNOSIS — R2689 Other abnormalities of gait and mobility: Secondary | ICD-10-CM | POA: Diagnosis not present

## 2017-11-22 ENCOUNTER — Ambulatory Visit (INDEPENDENT_AMBULATORY_CARE_PROVIDER_SITE_OTHER): Payer: Medicare Other | Admitting: Surgery

## 2017-11-22 ENCOUNTER — Encounter: Payer: Self-pay | Admitting: Surgery

## 2017-11-22 VITALS — BP 152/74 | HR 100 | Temp 97.5°F | Ht 67.0 in | Wt 145.6 lb

## 2017-11-22 DIAGNOSIS — Z09 Encounter for follow-up examination after completed treatment for conditions other than malignant neoplasm: Secondary | ICD-10-CM

## 2017-11-22 NOTE — Progress Notes (Signed)
S/p partial gastrectomy for GIST Path d/w pt in detail No further therapy required  PE NAD Abd: soft, incision c/d/i. No infection or peritonitis  A/p Doing very well No Heavy lifting RTC prn

## 2017-11-22 NOTE — Patient Instructions (Addendum)
Please call our office if you have questions or concerns.   No lifting anything heavier than a gallon of milk  pounds until 12/13/17.  You may get a Flu shot.

## 2017-11-29 ENCOUNTER — Other Ambulatory Visit: Payer: Self-pay | Admitting: Family Medicine

## 2017-12-07 ENCOUNTER — Encounter: Payer: Self-pay | Admitting: Family Medicine

## 2017-12-07 ENCOUNTER — Ambulatory Visit (INDEPENDENT_AMBULATORY_CARE_PROVIDER_SITE_OTHER): Payer: Medicare Other | Admitting: Family Medicine

## 2017-12-07 VITALS — BP 130/70 | HR 64 | Ht 67.0 in | Wt 144.0 lb

## 2017-12-07 DIAGNOSIS — I2510 Atherosclerotic heart disease of native coronary artery without angina pectoris: Secondary | ICD-10-CM | POA: Diagnosis not present

## 2017-12-07 DIAGNOSIS — I739 Peripheral vascular disease, unspecified: Secondary | ICD-10-CM

## 2017-12-07 DIAGNOSIS — R69 Illness, unspecified: Secondary | ICD-10-CM

## 2017-12-07 DIAGNOSIS — I10 Essential (primary) hypertension: Secondary | ICD-10-CM

## 2017-12-07 DIAGNOSIS — I70219 Atherosclerosis of native arteries of extremities with intermittent claudication, unspecified extremity: Secondary | ICD-10-CM | POA: Diagnosis not present

## 2017-12-07 DIAGNOSIS — F17200 Nicotine dependence, unspecified, uncomplicated: Secondary | ICD-10-CM

## 2017-12-07 DIAGNOSIS — E782 Mixed hyperlipidemia: Secondary | ICD-10-CM | POA: Diagnosis not present

## 2017-12-07 MED ORDER — CILOSTAZOL 100 MG PO TABS: 100.0000 mg | ORAL_TABLET | Freq: Two times a day (BID) | ORAL | 2 refills | Status: DC

## 2017-12-07 MED ORDER — LISINOPRIL 5 MG PO TABS: 5.0000 mg | ORAL_TABLET | Freq: Every day | ORAL | 1 refills | Status: DC

## 2017-12-07 MED ORDER — ATORVASTATIN CALCIUM 40 MG PO TABS: 40.0000 mg | ORAL_TABLET | Freq: Every day | ORAL | 1 refills | Status: DC

## 2017-12-07 MED ORDER — AMLODIPINE BESYLATE 10 MG PO TABS: 10.0000 mg | ORAL_TABLET | Freq: Every day | ORAL | 1 refills | Status: DC

## 2017-12-07 MED ORDER — CLOPIDOGREL BISULFATE 75 MG PO TABS: 75.0000 mg | ORAL_TABLET | Freq: Every day | ORAL | 1 refills | Status: DC

## 2017-12-07 NOTE — Progress Notes (Signed)
Date:  12/07/2017   Name:  Dylan Villanueva   DOB:  01/18/1941   MRN:  [id number]   Chief Complaint: Hypertension; Hyperlipidemia; and myocardial infarction (has stents/ takes plavix) Hypertension  This is a chronic problem. The current episode started more than 1 year ago. The problem is controlled. Pertinent negatives include no anxiety, blurred vision, chest pain, headaches, malaise/fatigue, neck pain, orthopnea, palpitations, peripheral edema, PND, shortness of breath or sweats. There are no associated agents to hypertension. There are no known risk factors for coronary artery disease. Past treatments include ACE inhibitors and calcium channel blockers. The current treatment provides moderate improvement. There are no compliance problems.  There is no history of angina, kidney disease, CAD/MI, CVA, heart failure, left ventricular hypertrophy, PVD or retinopathy. There is no history of chronic renal disease, a hypertension causing med or renovascular disease.  Hyperlipidemia  This is a chronic problem. The current episode started more than 1 year ago. The problem is controlled. Recent lipid tests were reviewed and are normal. He has no history of chronic renal disease, diabetes, hypothyroidism, liver disease, obesity or nephrotic syndrome. There are no known factors aggravating his hyperlipidemia. Pertinent negatives include no chest pain, focal sensory loss, focal weakness, leg pain, myalgias or shortness of breath. He is currently on no antihyperlipidemic treatment. The current treatment provides moderate improvement of lipids. There are no compliance problems.      Review of Systems  Constitutional: Negative for malaise/fatigue.  Eyes: Negative for blurred vision.  Respiratory: Negative for shortness of breath.   Cardiovascular: Negative for chest pain, palpitations, orthopnea and PND.  Musculoskeletal: Negative for myalgias and neck pain.  Neurological: Negative for focal weakness and  headaches.    Patient Active Problem List   Diagnosis Date Noted  . Gastric mass   . Bilateral carotid artery stenosis 08/30/2017  . Benign essential HTN 05/25/2017  . Non-ST elevation myocardial infarction (NSTEMI), subendocardial infarction, subsequent episode of care ([place]) 05/25/2017  . Chronic venous insufficiency 05/08/2017  . Coronary artery disease involving native coronary artery of native heart 05/08/2017  . Hyperlipidemia, mixed 05/08/2017  . Hypertension 05/08/2017  . History of prostate cancer 05/08/2017  . Gait instability 05/08/2017  . Vitamin D deficiency 05/08/2017  . Presence of stent in coronary artery in patient with coronary artery disease 05/08/2017  . History of shingles 05/08/2017    Allergies  Allergen Reactions  . Penicillins Nausea Only    Has patient had a PCN reaction causing immediate rash, facial/tongue/throat swelling, SOB or lightheadedness with hypotension: no Has patient had a PCN reaction causing severe rash involving mucus membranes or skin necrosis: no Has patient had a PCN reaction that required hospitalization: no Has patient had a PCN reaction occurring within the last 10 years: no If all of the above answers are "NO", then may proceed with Cephalosporin use.     Past Surgical History:  Procedure Laterality Date  . APPENDECTOMY  1980  . CARDIAC SURGERY    . CORONARY ANGIOPLASTY WITH STENT PLACEMENT    . ESOPHAGOGASTRODUODENOSCOPY N/A 11/07/2017   Procedure: ESOPHAGOGASTRODUODENOSCOPY (EGD);  Surgeon: Jules Husbands, MD;  Location: [facility];  Service: General;  Laterality: N/A;  . EUS N/A 07/13/2017   Procedure: FULL UPPER ENDOSCOPIC ULTRASOUND (EUS) RADIAL;  Surgeon: Jola Schmidt, MD;  Location: [facility] END[facility];  Service: Endoscopy;  Laterality: N/A;  . HIGH DOSE RADIATION IMPLANT INSERTION  2006  . LAPAROSCOPIC REMOVAL ABDOMINAL MASS N/A 11/07/2017   Procedure: LAPAROSCOPIC GASTRIC MASS RESECTION;  Surgeon: Jules Husbands, MD;  Location:  [facility] ORS;  Service: General;  Laterality: N/A;    Social History   Tobacco Use  . Smoking status: Current Every Day Smoker    Packs/day: 1.00    Years: 66.00    Pack years: 66.00    Types: Cigarettes  . Smokeless tobacco: Never Used  . Tobacco comment:      Substance Use Topics  . Alcohol use: Yes    Alcohol/week: 3.0 standard drinks    Types: 3 Cans of beer per week  . Drug use: Never     Medication list has been reviewed and updated.  Current Meds  Medication Sig  . amLODipine (NORVASC) 10 MG tablet TAKE 1 TABLET BY MOUTH DAILY  . aspirin EC 81 MG tablet Take 1 tablet (81 mg total) by mouth daily.  Marland Kitchen atorvastatin (LIPITOR) 40 MG tablet TAKE 1 TABLET(40 MG) BY MOUTH DAILY  . cilostazol (PLETAL) 100 MG tablet Take 1 tablet (100 mg total) by mouth 2 (two) times daily.  . clopidogrel (PLAVIX) 75 MG tablet Take 1 tablet (75 mg total) by mouth daily.  Marland Kitchen lisinopril (PRINIVIL,ZESTRIL) 5 MG tablet TAKE 1 TABLET(5 MG) BY MOUTH DAILY  . Multiple Vitamins-Minerals (MULTIVITAMIN ADULT PO) Take 1 capsule by mouth daily.  . Vitamin D, Cholecalciferol, 1000 units CAPS Take 1 capsule by mouth daily.    PHQ 2/9 Scores 06/01/2017 05/08/2017  PHQ - 2 Score 0 0  PHQ- 9 Score 0 -    Physical Exam  BP 130/70   Pulse 64   Ht 5\' 7"  (1.702 m)   Wt 144 lb (65.3 kg)   BMI 22.55 kg/m   Assessment and Plan:  1. 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 Continue amlodipine with pletal and plavix. - amLODipine (NORVASC) 10 MG tablet; Take 1 tablet (10 mg total) by mouth daily.  Dispense: 90 tablet; Refill: 1 - clopidogrel (PLAVIX) 75 MG tablet; Take 1 tablet (75 mg total) by mouth daily.  Dispense: 90 tablet; Refill: 1 - lisinopril (PRINIVIL,ZESTRIL) 5 MG tablet; Take 1 tablet (5 mg total) by mouth daily.  Dispense: 90 tablet; Refill: 1 - cilostazol (PLETAL) 100 MG tablet; Take 1 tablet (100 mg total) by mouth 2 (two) times daily.   Dispense: 60 tablet; Refill: 2  2. Coronary artery disease involving native coronary artery of native heart without angina pectoris Chronic Stable Controlled. Cont Norvasc and pletal/Plavex. - amLODipine (NORVASC) 10 MG tablet; Take 1 tablet (10 mg total) by mouth daily.  Dispense: 90 tablet; Refill: 1 - clopidogrel (PLAVIX) 75 MG tablet; Take 1 tablet (75 mg total) by mouth daily.  Dispense: 90 tablet; Refill: 1 - lisinopril (PRINIVIL,ZESTRIL) 5 MG tablet; Take 1 tablet (5 mg total) by mouth daily.  Dispense: 90 tablet; Refill: 1  3. PAD (peripheral artery disease) (HCC) Chronic Stable Continue lisinopril and norvasc - amLODipine (NORVASC) 10 MG tablet; Take 1 tablet (10 mg total) by mouth daily.  Dispense: 90 tablet; Refill: 1 - lisinopril (PRINIVIL,ZESTRIL) 5 MG tablet; Take 1 tablet (5 mg total) by mouth daily.  Dispense: 90 tablet; Refill: 1  4. Essential hypertension Chronic Controlled Cont lisinopril and amlodipine.Check renal panel- amLODipine (NORVASC) 10 MG tablet; Take 1 tablet (10 mg total) by mouth daily.  Dispense: 90 tablet; Refill: 1 - lisinopril (PRINIVIL,ZESTRIL) 5 MG tablet; Take 1 tablet (5 mg total) by mouth daily.  Dispense: 90 tablet; Refill: 1  5. Hyperlipidemia, mixed Chronic Controlled. Continiue atorvastatin and  check lipid panel - atorvastatin (LIPITOR) 40 MG tablet; Take 1 tablet (40 mg total) by mouth daily.  Dispense: 90 tablet; Refill: 1 - Lipid Panel With LDL/HDL Ratio  6. Tobacco dependence Patient has been advised of the health risks of smoking and counseled concerning cessation of tobacco products. I spent over 3 minutes for discussion and to answer questions.  7. Taking medication for chronic disease Check hepatic panel while on statin - Hepatic function panel   Dr. Otilio Miu [facility] [place]  12/07/2017

## 2017-12-08 LAB — HEPATIC FUNCTION PANEL
ALT: 19 IU/L (ref 0–44)
AST: 19 IU/L (ref 0–40)
Albumin: 4.3 g/dL (ref 3.5–4.8)
Alkaline Phosphatase: 61 IU/L (ref 39–117)
Bilirubin Total: 0.3 mg/dL (ref 0.0–1.2)
Bilirubin, Direct: 0.13 mg/dL (ref 0.00–0.40)
TOTAL PROTEIN: 6.5 g/dL (ref 6.0–8.5)

## 2017-12-08 LAB — LIPID PANEL WITH LDL/HDL RATIO
Cholesterol, Total: 132 mg/dL (ref 100–199)
HDL: 58 mg/dL (ref >39)
LDL Calculated: 59 mg/dL (ref 0–99)
LDL/HDL RATIO: 1 ratio (ref 0.0–3.6)
Triglycerides: 74 mg/dL (ref 0–149)
VLDL CHOLESTEROL CAL: 15 mg/dL (ref 5–40)

## 2017-12-11 ENCOUNTER — Other Ambulatory Visit: Payer: Self-pay | Admitting: Family Medicine

## 2017-12-13 DIAGNOSIS — Z23 Encounter for immunization: Secondary | ICD-10-CM | POA: Diagnosis not present

## 2018-01-03 DIAGNOSIS — I2510 Atherosclerotic heart disease of native coronary artery without angina pectoris: Secondary | ICD-10-CM | POA: Diagnosis not present

## 2018-01-03 DIAGNOSIS — I6523 Occlusion and stenosis of bilateral carotid arteries: Secondary | ICD-10-CM | POA: Diagnosis not present

## 2018-01-03 DIAGNOSIS — E782 Mixed hyperlipidemia: Secondary | ICD-10-CM | POA: Diagnosis not present

## 2018-01-03 DIAGNOSIS — I10 Essential (primary) hypertension: Secondary | ICD-10-CM | POA: Diagnosis not present

## 2018-01-16 ENCOUNTER — Other Ambulatory Visit: Payer: Self-pay | Admitting: Family Medicine

## 2018-01-16 DIAGNOSIS — I70219 Atherosclerosis of native arteries of extremities with intermittent claudication, unspecified extremity: Secondary | ICD-10-CM

## 2018-02-15 ENCOUNTER — Telehealth: Payer: Self-pay

## 2018-02-15 ENCOUNTER — Ambulatory Visit (INDEPENDENT_AMBULATORY_CARE_PROVIDER_SITE_OTHER): Payer: Medicare Other | Admitting: Family Medicine

## 2018-02-15 ENCOUNTER — Other Ambulatory Visit: Payer: Self-pay

## 2018-02-15 ENCOUNTER — Encounter: Payer: Self-pay | Admitting: Family Medicine

## 2018-02-15 VITALS — BP 120/60 | HR 64 | Ht 67.0 in | Wt 145.0 lb

## 2018-02-15 DIAGNOSIS — F17200 Nicotine dependence, unspecified, uncomplicated: Secondary | ICD-10-CM

## 2018-02-15 DIAGNOSIS — R198 Other specified symptoms and signs involving the digestive system and abdomen: Secondary | ICD-10-CM | POA: Diagnosis not present

## 2018-02-15 DIAGNOSIS — Z86010 Personal history of colonic polyps: Secondary | ICD-10-CM

## 2018-02-15 NOTE — Telephone Encounter (Signed)
Pt left vm he is returning a call from [place]

## 2018-02-15 NOTE — Progress Notes (Signed)
Date:  02/15/2018   Name:  Dylan Villanueva   DOB:  1940-08-18   MRN:  [id number]   Chief Complaint: belching ("burping 30-40 times a day and feel like I'm going to throw up". Had stomach operation in Sept, but this didn't start until December. No burning sensation in throat/ esophagus)  Abdominal Pain  Chronicity: "feeling"/ with nausea/  Episode onset: 2 months after surgery developed belching episodes. The onset quality is sudden. The problem occurs daily. The pain is mild. The quality of the pain is a sensation of fullness. The abdominal pain radiates to the epigastric region. Associated symptoms include belching and nausea. Pertinent negatives include no anorexia, constipation, diarrhea, dysuria, fever, flatus, frequency, headaches, hematochezia, hematuria, melena, myalgias, vomiting or weight loss. Exacerbated by: coffee. The pain is relieved by nothing. He has tried nothing for the symptoms. Prior diagnostic workup includes ultrasound and upper endoscopy. GIST tumor/ multiple colonic polyps    Review of Systems  Constitutional: Negative for chills, fever and weight loss.  HENT: Negative for drooling, ear discharge, ear pain and sore throat.   Respiratory: Negative for cough, shortness of breath and wheezing.   Cardiovascular: Negative for chest pain, palpitations and leg swelling.  Gastrointestinal: Positive for abdominal pain and nausea. Negative for anorexia, blood in stool, constipation, diarrhea, flatus, hematochezia, melena and vomiting.  Endocrine: Negative for polydipsia.  Genitourinary: Negative for dysuria, frequency, hematuria and urgency.  Musculoskeletal: Negative for back pain, myalgias and neck pain.  Skin: Negative for rash.  Allergic/Immunologic: Negative for environmental allergies.  Neurological: Negative for dizziness and headaches.  Hematological: Does not bruise/bleed easily.  Psychiatric/Behavioral: Negative for suicidal ideas. The patient is not nervous/anxious.       Patient Active Problem List   Diagnosis Date Noted  . Gastric mass   . Bilateral carotid artery stenosis 08/30/2017  . Benign essential HTN 05/25/2017  . Non-ST elevation myocardial infarction (NSTEMI), subendocardial infarction, subsequent episode of care ([place]) 05/25/2017  . Chronic venous insufficiency 05/08/2017  . Coronary artery disease involving native coronary artery of native heart 05/08/2017  . Hyperlipidemia, mixed 05/08/2017  . Hypertension 05/08/2017  . History of prostate cancer 05/08/2017  . Gait instability 05/08/2017  . Vitamin D deficiency 05/08/2017  . Presence of stent in coronary artery in patient with coronary artery disease 05/08/2017  . History of shingles 05/08/2017    Allergies  Allergen Reactions  . Penicillins Nausea Only    Has patient had a PCN reaction causing immediate rash, facial/tongue/throat swelling, SOB or lightheadedness with hypotension: no Has patient had a PCN reaction causing severe rash involving mucus membranes or skin necrosis: no Has patient had a PCN reaction that required hospitalization: no Has patient had a PCN reaction occurring within the last 10 years: no If all of the above answers are "NO", then may proceed with Cephalosporin use.     Past Surgical History:  Procedure Laterality Date  . APPENDECTOMY  1980  . CARDIAC SURGERY    . CORONARY ANGIOPLASTY WITH STENT PLACEMENT    . ESOPHAGOGASTRODUODENOSCOPY N/A 11/07/2017   Procedure: ESOPHAGOGASTRODUODENOSCOPY (EGD);  Surgeon: Jules Husbands, MD;  Location: [facility];  Service: General;  Laterality: N/A;  . EUS N/A 07/13/2017   Procedure: FULL UPPER ENDOSCOPIC ULTRASOUND (EUS) RADIAL;  Surgeon: Jola Schmidt, MD;  Location: [facility];  Service: Endoscopy;  Laterality: N/A;  . HIGH DOSE RADIATION IMPLANT INSERTION  2006  . LAPAROSCOPIC REMOVAL ABDOMINAL MASS N/A 11/07/2017   Procedure: LAPAROSCOPIC GASTRIC MASS RESECTION;  Surgeon: Jules Husbands, MD;  Location: [facility];   Service: General;  Laterality: N/A;    Social History   Tobacco Use  . Smoking status: Current Every Day Smoker    Packs/day: 1.00    Years: 66.00    Pack years: 66.00    Types: Cigarettes  . Smokeless tobacco: Never Used  . Tobacco comment:      Substance Use Topics  . Alcohol use: Yes    Alcohol/week: 3.0 standard drinks    Types: 3 Cans of beer per week  . Drug use: Never     Medication list has been reviewed and updated.  Current Meds  Medication Sig  . amLODipine (NORVASC) 10 MG tablet Take 1 tablet (10 mg total) by mouth daily.  Marland Kitchen atorvastatin (LIPITOR) 40 MG tablet Take 1 tablet (40 mg total) by mouth daily.  . cilostazol (PLETAL) 100 MG tablet Take 1 tablet (100 mg total) by mouth 2 (two) times daily.  . clopidogrel (PLAVIX) 75 MG tablet Take 1 tablet (75 mg total) by mouth daily.  Marland Kitchen lisinopril (PRINIVIL,ZESTRIL) 5 MG tablet Take 1 tablet (5 mg total) by mouth daily.  . Multiple Vitamins-Minerals (MULTIVITAMIN ADULT PO) Take 1 capsule by mouth daily.  . Vitamin D, Cholecalciferol, 1000 units CAPS Take 1 capsule by mouth daily.  . [DISCONTINUED] aspirin EC 81 MG tablet Take 1 tablet (81 mg total) by mouth daily.    PHQ 2/9 Scores 06/01/2017 05/08/2017  PHQ - 2 Score 0 0  PHQ- 9 Score 0 -    Physical Exam  BP 120/60   Pulse 64   Ht 5\' 7"  (1.702 m)   Wt 145 lb (65.8 kg)   BMI 22.71 kg/m   Assessment and Plan:  1. Tobacco dependence Patient has been advised of the health risks of smoking and counseled concerning cessation of tobacco products. I spent over 3 minutes for discussion and to answer questions.  2. Abdominal fullness Patient has experienced abdominal fullness with nausea's symptoms the past several weeks as his surgery for for gastrectomy of a GI ST tumor.  Will refer to Dr. Titus Mould for evaluation of postsurgical symptoms for evaluation and treatment thereof. - Ambulatory referral to General Surgery  3. History of colon polyps And had a history  of notable polyps removed colonoscopy in [place] patient has not experienced any hematochezia however will refer to gastroenterology for evaluation and if necessary consideration for colonoscopy. - Ambulatory referral to Gastroenterology

## 2018-02-15 NOTE — Telephone Encounter (Signed)
Returned patients call.  Colonoscopy scheduled for patient with Dr. Allen Norris at [facility] on 03/02/18.   Thanks [employer]

## 2018-02-22 ENCOUNTER — Other Ambulatory Visit: Payer: Self-pay

## 2018-02-28 ENCOUNTER — Encounter: Payer: Self-pay | Admitting: Surgery

## 2018-02-28 ENCOUNTER — Encounter: Payer: Self-pay | Admitting: *Deleted

## 2018-02-28 ENCOUNTER — Ambulatory Visit (INDEPENDENT_AMBULATORY_CARE_PROVIDER_SITE_OTHER): Payer: Medicare Other | Admitting: Surgery

## 2018-02-28 ENCOUNTER — Other Ambulatory Visit: Payer: Self-pay

## 2018-02-28 VITALS — BP 124/72 | HR 81 | Temp 97.9°F | Ht 67.0 in | Wt 143.0 lb

## 2018-02-28 DIAGNOSIS — K30 Functional dyspepsia: Secondary | ICD-10-CM | POA: Diagnosis not present

## 2018-02-28 MED ORDER — METOCLOPRAMIDE HCL 10 MG PO TABS: 10.0000 mg | ORAL_TABLET | Freq: Three times a day (TID) | ORAL | 0 refills | Status: DC

## 2018-02-28 NOTE — Patient Instructions (Addendum)
Patient is to return to the office in 1 month. He will need lab work completed (cmp and cbc) and a RUQ U/S. Please pick up your medication from your local pharmacy.  Call the office with any questions or concerns.  Metoclopramide tablets What is this medicine? METOCLOPRAMIDE (met oh kloe PRA mide) is used to treat the symptoms of gastroesophageal reflux disease (GERD) like heartburn. It is also used to treat people with slow emptying of the stomach and intestinal tract. This medicine may be used for other purposes; ask your health care provider or pharmacist if you have questions. COMMON BRAND NAME(S): Reglan What should I tell my health care provider before I take this medicine? They need to know if you have any of these conditions: -breast cancer -depression -diabetes -heart failure -high blood pressure -kidney disease -liver disease -Parkinson's disease or a movement disorder -pheochromocytoma -seizures -stomach obstruction, bleeding, or perforation -an unusual or allergic reaction to metoclopramide, procainamide, sulfites, other medicines, foods, dyes, or preservatives -pregnant or trying to get pregnant -breast-feeding How should I use this medicine? Take this medicine by mouth with a glass of water. Follow the directions on the prescription label. Take this medicine on an empty stomach, about 30 minutes before eating. Take your doses at regular intervals. Do not take your medicine more often than directed. Do not stop taking except on the advice of your doctor or health care professional. A special MedGuide will be given to you by the pharmacist with each prescription and refill. Be sure to read this information carefully each time. Talk to your pediatrician regarding the use of this medicine in children. Special care may be needed. Overdosage: If you think you have taken too much of this medicine contact a poison control center or emergency room at once. NOTE: This medicine is  only for you. Do not share this medicine with others. What if I miss a dose? If you miss a dose, take it as soon as you can. If it is almost time for your next dose, take only that dose. Do not take double or extra doses. What may interact with this medicine? -acetaminophen -cyclosporine -digoxin -medicines for blood pressure -medicines for diabetes, including insulin -medicines for hay fever and other allergies -medicines for depression, especially a Monoamine Oxidase Inhibitor (MAOI) -medicines for Parkinson's disease, like levodopa -medicines for sleep or for pain -quinidine -tetracycline This list may not describe all possible interactions. Give your health care provider a list of all the medicines, herbs, non-prescription drugs, or dietary supplements you use. Also tell them if you smoke, drink alcohol, or use illegal drugs. Some items may interact with your medicine. What should I watch for while using this medicine? It may take a few weeks for your stomach condition to start to get better. However, do not take this medicine for longer than 12 weeks. The longer you take this medicine, and the more you take it, the greater your chances are of developing serious side effects. If you are an elderly patient, a male patient, or you have diabetes, you may be at an increased risk for side effects from this medicine. Contact your doctor immediately if you start having movements you cannot control such as lip smacking, rapid movements of the tongue, involuntary or uncontrollable movements of the eyes, head, arms and legs, or muscle twitches and spasms. Patients and their families should watch out for worsening depression or thoughts of suicide. Also watch out for any sudden or severe changes in feelings such  as feeling anxious, agitated, panicky, irritable, hostile, aggressive, impulsive, severely restless, overly excited and hyperactive, or not being able to sleep. If this happens, especially at  the beginning of treatment or after a change in dose, call your doctor. Do not treat yourself for high fever. Ask your doctor or health care professional for advice. You may get drowsy or dizzy. Do not drive, use machinery, or do anything that needs mental alertness until you know how this drug affects you. Do not stand or sit up quickly, especially if you are an older patient. This reduces the risk of dizzy or fainting spells. Alcohol can make you more drowsy and dizzy. Avoid alcoholic drinks. What side effects may I notice from receiving this medicine? Side effects that you should report to your doctor or health care professional as soon as possible: -allergic reactions like skin rash, itching or hives, swelling of the face, lips, or tongue -abnormal production of milk in females -breast enlargement in both males and females -change in the way you walk -difficulty moving, speaking or swallowing -drooling, lip smacking, or rapid movements of the tongue -excessive sweating -fever -involuntary or uncontrollable movements of the eyes, head, arms and legs -irregular heartbeat or palpitations -muscle twitches and spasms -unusually weak or tired Side effects that usually do not require medical attention (report to your doctor or health care professional if they continue or are bothersome): -change in sex drive or performance -depressed mood -diarrhea -difficulty sleeping -headache -menstrual changes -restless or nervous This list may not describe all possible side effects. Call your doctor for medical advice about side effects. You may report side effects to FDA at 1-800-FDA-1088. Where should I keep my medicine? Keep out of the reach of children. Store at room temperature between 20 and 25 degrees C (68 and 77 degrees F). Protect from light. Keep container tightly closed. Throw away any unused medicine after the expiration date. NOTE: This sheet is a summary. It may not cover all possible  information. If you have questions about this medicine, talk to your doctor, pharmacist, or health care provider.  2019 Elsevier/Gold Standard (2015-11-18 15:13:45)

## 2018-02-28 NOTE — Progress Notes (Signed)
Patient has been scheduled for a right upper quadrant abdominal ultrasound for Tuesday, 03-06-18 at [facility] for 9 am (arrive 8:45 am). Prep: NPO 4 hours prior.   The patient will also have the following labs drawn on 03-06-18 at [facility]: CBC and CMP.  The patient will follow up in the office with Dr. Dahlia Byes as scheduled for 03-28-18.  Patient aware to call the office if he has further questions.

## 2018-03-01 NOTE — Discharge Instructions (Signed)
General Anesthesia, Adult, Care After  This sheet gives you information about how to care for yourself after your procedure. Your health care provider may also give you more specific instructions. If you have problems or questions, contact your health care provider.  What can I expect after the procedure?  After the procedure, the following side effects are common:  Pain or discomfort at the IV site.  Nausea.  Vomiting.  Sore throat.  Trouble concentrating.  Feeling cold or chills.  Weak or tired.  Sleepiness and fatigue.  Soreness and body aches. These side effects can affect parts of the body that were not involved in surgery.  Follow these instructions at home:    For at least 24 hours after the procedure:  Have a responsible adult stay with you. It is important to have someone help care for you until you are awake and alert.  Rest as needed.  Do not:  Participate in activities in which you could fall or become injured.  Drive.  Use heavy machinery.  Drink alcohol.  Take sleeping pills or medicines that cause drowsiness.  Make important decisions or sign legal documents.  Take care of children on your own.  Eating and drinking  Follow any instructions from your health care provider about eating or drinking restrictions.  When you feel hungry, start by eating small amounts of foods that are soft and easy to digest (bland), such as toast. Gradually return to your regular diet.  Drink enough fluid to keep your urine pale yellow.  If you vomit, rehydrate by drinking water, juice, or clear broth.  General instructions  If you have sleep apnea, surgery and certain medicines can increase your risk for breathing problems. Follow instructions from your health care provider about wearing your sleep device:  Anytime you are sleeping, including during daytime naps.  While taking prescription pain medicines, sleeping medicines, or medicines that make you drowsy.  Return to your normal activities as told by your health care  provider. Ask your health care provider what activities are safe for you.  Take over-the-counter and prescription medicines only as told by your health care provider.  If you smoke, do not smoke without supervision.  Keep all follow-up visits as told by your health care provider. This is important.  Contact a health care provider if:  You have nausea or vomiting that does not get better with medicine.  You cannot eat or drink without vomiting.  You have pain that does not get better with medicine.  You are unable to pass urine.  You develop a skin rash.  You have a fever.  You have redness around your IV site that gets worse.  Get help right away if:  You have difficulty breathing.  You have chest pain.  You have blood in your urine or stool, or you vomit blood.  Summary  After the procedure, it is common to have a sore throat or nausea. It is also common to feel tired.  Have a responsible adult stay with you for the first 24 hours after general anesthesia. It is important to have someone help care for you until you are awake and alert.  When you feel hungry, start by eating small amounts of foods that are soft and easy to digest (bland), such as toast. Gradually return to your regular diet.  Drink enough fluid to keep your urine pale yellow.  Return to your normal activities as told by your health care provider. Ask your health care   provider what activities are safe for you.  This information is not intended to replace advice given to you by your health care provider. Make sure you discuss any questions you have with your health care provider.  Document Released: 05/09/2000 Document Revised: 09/16/2016 Document Reviewed: 09/16/2016  Elsevier Interactive Patient Education  2019 [employer]vier Inc[employer]

## 2018-03-01 NOTE — Progress Notes (Signed)
Outpatient Surgical Follow Up  03/01/2018  Dylan Villanueva is an 78 y.o. male.   Chief Complaint  Patient presents with  . Follow-up    abdominal fullness    HPI: Dylan Villanueva well known patient to me with history of partial gastrectomy for gist tumor months ago.  Now he comes for new onset of bloating that happened about 2 months after his surgery.  He is able to take diet and have bowel movements.  No fevers no chills no shortness of breath.  He reports some abdominal discomfort after eating.  There is no pain there is some mild bloating.  He does have a pending colonoscopy in the next few weeks due to a history of previous polyps. No emesis. He smokes daily  Past Medical History:  Diagnosis Date  . Cancer [facility]) 2006   prostate   . Colon polyps   . Hypertension   . Myocardial infarction ([place])   . Smoker   . Squamous acanthoma of external ear, right     Past Surgical History:  Procedure Laterality Date  . APPENDECTOMY  1980  . CARDIAC SURGERY    . CORONARY ANGIOPLASTY WITH STENT PLACEMENT    . ESOPHAGOGASTRODUODENOSCOPY N/A 11/07/2017   Procedure: ESOPHAGOGASTRODUODENOSCOPY (EGD);  Surgeon: Jules Husbands, MD;  Location: [facility] ORS;  Service: General;  Laterality: N/A;  . EUS N/A 07/13/2017   Procedure: FULL UPPER ENDOSCOPIC ULTRASOUND (EUS) RADIAL;  Surgeon: Jola Schmidt, MD;  Location: [facility] ENDOSCOPY;  Service: Endoscopy;  Laterality: N/A;  . HIGH DOSE RADIATION IMPLANT INSERTION  2006  . LAPAROSCOPIC REMOVAL ABDOMINAL MASS N/A 11/07/2017   Procedure: LAPAROSCOPIC GASTRIC MASS RESECTION;  Surgeon: Jules Husbands, MD;  Location: [facility] ORS;  Service: General;  Laterality: N/A;    Family History  Problem Relation Age of Onset  . Cancer Mother        pancreatic  . Dementia Father   . Cancer Sister        breast   . Aneurysm Brother     Social History:  reports that he has been smoking cigarettes. He has a 66.00 pack-year smoking history. He has never used smokeless tobacco. He  reports current alcohol use of about 3.0 standard drinks of alcohol per week. He reports that he does not use drugs.  Allergies:  Allergies  Allergen Reactions  . Penicillins Nausea Only    Has patient had a PCN reaction causing immediate rash, facial/tongue/throat swelling, SOB or lightheadedness with hypotension: no Has patient had a PCN reaction causing severe rash involving mucus membranes or skin necrosis: no Has patient had a PCN reaction that required hospitalization: no Has patient had a PCN reaction occurring within the last 10 years: no If all of the above answers are "NO", then may proceed with Cephalosporin use.     Medications reviewed.    ROS Full ROS performed and is otherwise negative other than what is stated in HPI   BP 124/72   Pulse 81   Temp 97.9 F (36.6 C) (Temporal)   Ht 5\' 7"  (1.702 m)   Wt 143 lb (64.9 kg)   BMI 22.40 kg/m   Physical Exam Vitals signs and nursing note reviewed.  Constitutional:      General: He is not in acute distress.    Appearance: Normal appearance. He is normal weight.  Neck:     Musculoskeletal: Normal range of motion and neck supple.  Cardiovascular:     Rate and Rhythm: Normal rate and regular rhythm.  Pulmonary:     Effort: Pulmonary effort is normal.  Chest:     Chest wall: No tenderness.  Abdominal:     General: Abdomen is flat. There is no distension.     Palpations: There is no mass.     Tenderness: There is no abdominal tenderness. There is no guarding or rebound.     Hernia: No hernia is present.     Comments: Scar w/o issues, no hernias or infection  Musculoskeletal: Normal range of motion.        General: No swelling.  Skin:    General: Skin is warm and dry.     Capillary Refill: Capillary refill takes less than 2 seconds.  Neurological:     Mental Status: He is alert and oriented to person, place, and time.  Psychiatric:        Mood and Affect: Mood normal.        Behavior: Behavior normal.         Thought Content: Thought content normal.        Judgment: Judgment normal.        Assessment/Plan: Bloating and abd discomfort. Not specific for any particular GI pathology. Differential may include delayed gastric emptying, GB issues. We will perform w/u to include- US Abdomen Limited RUQ; - CBC with Differential/Platelet;  Comprehensive metabolic panel.  I will see him back in a few weeks. D/W the pt in detail and he wishes to start empiric reglan. May need gastric emptying study as well . We will ask GI to perform EGD as well. Greater than 50% of the 25 minutes  visit was spent in counseling/coordination of care   Caroleen Hamman, MD [place] Surgeon

## 2018-03-02 ENCOUNTER — Ambulatory Visit: Payer: Medicare Other | Admitting: Anesthesiology

## 2018-03-02 ENCOUNTER — Encounter
Admission: RE | Disposition: A | Discharge status: Home / Self Care | Payer: Self-pay | Source: Ambulatory Visit | Attending: Gastroenterology

## 2018-03-02 ENCOUNTER — Ambulatory Visit
Admission: RE | Admit: 2018-03-02 | Discharge: 2018-03-02 | Disposition: A | Discharge status: Home / Self Care | Payer: Medicare Other | Source: Ambulatory Visit | Attending: Gastroenterology | Admitting: Gastroenterology

## 2018-03-02 DIAGNOSIS — F17210 Nicotine dependence, cigarettes, uncomplicated: Secondary | ICD-10-CM | POA: Insufficient documentation

## 2018-03-02 DIAGNOSIS — Z1211 Encounter for screening for malignant neoplasm of colon: Secondary | ICD-10-CM | POA: Insufficient documentation

## 2018-03-02 DIAGNOSIS — E785 Hyperlipidemia, unspecified: Secondary | ICD-10-CM | POA: Insufficient documentation

## 2018-03-02 DIAGNOSIS — Z7902 Long term (current) use of antithrombotics/antiplatelets: Secondary | ICD-10-CM | POA: Insufficient documentation

## 2018-03-02 DIAGNOSIS — K640 First degree hemorrhoids: Secondary | ICD-10-CM | POA: Diagnosis not present

## 2018-03-02 DIAGNOSIS — D214 Benign neoplasm of connective and other soft tissue of abdomen: Secondary | ICD-10-CM | POA: Diagnosis not present

## 2018-03-02 DIAGNOSIS — Z8546 Personal history of malignant neoplasm of prostate: Secondary | ICD-10-CM | POA: Diagnosis not present

## 2018-03-02 DIAGNOSIS — X58XXXA Exposure to other specified factors, initial encounter: Secondary | ICD-10-CM | POA: Diagnosis not present

## 2018-03-02 DIAGNOSIS — I10 Essential (primary) hypertension: Secondary | ICD-10-CM | POA: Diagnosis not present

## 2018-03-02 DIAGNOSIS — Z955 Presence of coronary angioplasty implant and graft: Secondary | ICD-10-CM | POA: Insufficient documentation

## 2018-03-02 DIAGNOSIS — Z800 Family history of malignant neoplasm of digestive organs: Secondary | ICD-10-CM | POA: Diagnosis not present

## 2018-03-02 DIAGNOSIS — C169 Malignant neoplasm of stomach, unspecified: Secondary | ICD-10-CM

## 2018-03-02 DIAGNOSIS — I252 Old myocardial infarction: Secondary | ICD-10-CM | POA: Insufficient documentation

## 2018-03-02 DIAGNOSIS — Z860100 Personal history of colon polyps, unspecified: Secondary | ICD-10-CM

## 2018-03-02 DIAGNOSIS — Z08 Encounter for follow-up examination after completed treatment for malignant neoplasm: Secondary | ICD-10-CM | POA: Insufficient documentation

## 2018-03-02 DIAGNOSIS — D122 Benign neoplasm of ascending colon: Secondary | ICD-10-CM | POA: Diagnosis not present

## 2018-03-02 DIAGNOSIS — T182XXA Foreign body in stomach, initial encounter: Secondary | ICD-10-CM | POA: Insufficient documentation

## 2018-03-02 DIAGNOSIS — Z79899 Other long term (current) drug therapy: Secondary | ICD-10-CM | POA: Insufficient documentation

## 2018-03-02 DIAGNOSIS — Z923 Personal history of irradiation: Secondary | ICD-10-CM | POA: Insufficient documentation

## 2018-03-02 DIAGNOSIS — D123 Benign neoplasm of transverse colon: Secondary | ICD-10-CM

## 2018-03-02 DIAGNOSIS — Z980 Intestinal bypass and anastomosis status: Secondary | ICD-10-CM | POA: Insufficient documentation

## 2018-03-02 DIAGNOSIS — Z85028 Personal history of other malignant neoplasm of stomach: Secondary | ICD-10-CM | POA: Diagnosis not present

## 2018-03-02 DIAGNOSIS — I2510 Atherosclerotic heart disease of native coronary artery without angina pectoris: Secondary | ICD-10-CM | POA: Diagnosis not present

## 2018-03-02 HISTORY — PX: POLYPECTOMY: SHX5525

## 2018-03-02 HISTORY — PX: COLONOSCOPY WITH PROPOFOL: SHX5780

## 2018-03-02 HISTORY — PX: ESOPHAGOGASTRODUODENOSCOPY (EGD) WITH PROPOFOL: SHX5813

## 2018-03-02 SURGERY — COLONOSCOPY WITH PROPOFOL
Anesthesia: General | Site: Throat

## 2018-03-02 MED ORDER — GLYCOPYRROLATE 0.2 MG/ML IJ SOLN
INTRAMUSCULAR | Status: DC | PRN
  Administered 2018-03-02: 0.1 mg via INTRAVENOUS

## 2018-03-02 MED ORDER — STERILE WATER FOR IRRIGATION IR SOLN
Status: DC | PRN
  Administered 2018-03-02: 10:00:00

## 2018-03-02 MED ORDER — SODIUM CHLORIDE 0.9 % IV SOLN: INTRAVENOUS | Status: DC

## 2018-03-02 MED ORDER — LACTATED RINGERS IV SOLN
10.0000 mL/h | INTRAVENOUS | Status: DC
  Administered 2018-03-02: 10 mL/h via INTRAVENOUS

## 2018-03-02 MED ORDER — LIDOCAINE HCL (CARDIAC) PF 100 MG/5ML IV SOSY
PREFILLED_SYRINGE | INTRAVENOUS | Status: DC | PRN
  Administered 2018-03-02: 30 mg via INTRAVENOUS

## 2018-03-02 MED ORDER — PROPOFOL 10 MG/ML IV BOLUS
INTRAVENOUS | Status: DC | PRN
  Administered 2018-03-02: 40 mg via INTRAVENOUS
  Administered 2018-03-02: 20 mg via INTRAVENOUS
  Administered 2018-03-02: 40 mg via INTRAVENOUS
  Administered 2018-03-02: 100 mg via INTRAVENOUS
  Administered 2018-03-02: 40 mg via INTRAVENOUS

## 2018-03-02 SURGICAL SUPPLY — 15 items
BLOCK BITE 60FR ADLT L/F GRN (MISCELLANEOUS) ×2 IMPLANT
CANISTER SUCT 1200ML W/VALVE (MISCELLANEOUS) ×5 IMPLANT
CLIP HMST 235XBRD CATH ROT (MISCELLANEOUS) IMPLANT
CLIP RESOLUTION 360 11X235 (MISCELLANEOUS)
FORCEPS BIOP RAD 4 LRG CAP 4 (CUTTING FORCEPS) ×2 IMPLANT
GOWN CVR UNV OPN BCK APRN NK (MISCELLANEOUS) ×6 IMPLANT
GOWN ISOL THUMB LOOP REG UNIV (MISCELLANEOUS) ×4
INJECTOR VARIJECT VIN23 (MISCELLANEOUS) IMPLANT
KIT ENDO PROCEDURE OLY (KITS) ×7 IMPLANT
MARKER SPOT ENDO TATTOO 5ML (MISCELLANEOUS) IMPLANT
SNARE SHORT THROW 13M SML OVAL (MISCELLANEOUS) IMPLANT
SPOT EX ENDOSCOPIC TATTOO (MISCELLANEOUS)
TRAP ETRAP POLY (MISCELLANEOUS) IMPLANT
VARIJECT INJECTOR VIN23 (MISCELLANEOUS)
WATER STERILE IRR 250ML POUR (IV SOLUTION) ×7 IMPLANT

## 2018-03-02 NOTE — Anesthesia Procedure Notes (Signed)
Date/Time: 03/02/2018 9:39 AM Performed by: Cameron Ali, CRNA Pre-anesthesia Checklist: Patient identified, Emergency Drugs available, Suction available, Timeout performed and Patient being monitored Patient Re-evaluated:Patient Re-evaluated prior to induction Oxygen Delivery Method: Nasal cannula Placement Confirmation: positive ETCO2

## 2018-03-02 NOTE — Transfer of Care (Signed)
Immediate Anesthesia Transfer of Care Note  Patient: Dylan Villanueva  Procedure(s) Performed: COLONOSCOPY WITH PROPOFOL (N/A ) ESOPHAGOGASTRODUODENOSCOPY (EGD) WITH PROPOFOL (Throat) POLYPECTOMY (Rectum)  Patient Location: PACU  Anesthesia Type: General  Level of Consciousness: awake, alert  and patient cooperative  Airway and Oxygen Therapy: Patient Spontanous Breathing and Patient connected to supplemental oxygen  Post-op Assessment: Post-op Vital signs reviewed, Patient's Cardiovascular Status Stable, Respiratory Function Stable, Patent Airway and No signs of Nausea or vomiting  Post-op Vital Signs: Reviewed and stable  Complications: No apparent anesthesia complications

## 2018-03-02 NOTE — Op Note (Signed)
[facility] Gastroenterology Patient Name: Dylan Villanueva Procedure Date: 03/02/2018 9:27 AM MRN: [id number] Account #: [id number] Date of Birth: Nov 06, 1940 Admit Type: Outpatient Age: 78 Room: [facility] OR ROOM 01 Gender: Male Note Status: Finalized Procedure:            Colonoscopy Indications:          High risk colon cancer surveillance: Personal history                        of colonic polyps Providers:            [name] MD, MD Medicines:            Propofol per Anesthesia Complications:        No immediate complications. Procedure:            Pre-Anesthesia Assessment:                       - Prior to the procedure, a History and Physical was                        performed, and patient medications and allergies were                        reviewed. The patient's tolerance of previous                        anesthesia was also reviewed. The risks and benefits of                        the procedure and the sedation options and risks were                        discussed with the patient. All questions were                        answered, and informed consent was obtained. Prior                        Anticoagulants: The patient has taken no previous                        anticoagulant or antiplatelet agents. ASA Grade                        Assessment: II - A patient with mild systemic disease.                        After reviewing the risks and benefits, the patient was                        deemed in satisfactory condition to undergo the                        procedure.                       After obtaining informed consent, the colonoscope was                        passed under direct vision. Throughout the  procedure,                        the patient's blood pressure, pulse, and oxygen                        saturations were monitored continuously. The was                        introduced through the anus and advanced to the the          cecum, identified by appendiceal orifice and ileocecal                        valve. The colonoscopy was performed without                        difficulty. The patient tolerated the procedure well.                        The quality of the bowel preparation was fair. Findings:      The perianal and digital rectal examinations were normal.      Two sessile polyps were found in the transverse colon. The polyps were 3       to 5 mm in size. These polyps were removed with a cold snare. Resection       and retrieval were complete.      A 5 mm polyp was found in the ascending colon. The polyp was       pedunculated. The polyp was removed with a cold snare. Resection and       retrieval were complete.      Non-bleeding internal hemorrhoids were found during retroflexion. The       hemorrhoids were Grade I (internal hemorrhoids that do not prolapse). Impression:           - Preparation of the colon was fair.                       - Two 3 to 5 mm polyps in the transverse colon, removed                        with a cold snare. Resected and retrieved.                       - One 5 mm polyp in the ascending colon, removed with a                        cold snare. Resected and retrieved.                       - Non-bleeding internal hemorrhoids. Recommendation:       - Discharge patient to home.                       - Resume previous diet.                       - Continue present medications.                       - Await pathology results. Procedure Code(s):    --- Professional ---  45385, Colonoscopy, flexible; with removal of tumor(s),                        polyp(s), or other lesion(s) by snare technique Diagnosis Code(s):    --- Professional ---                       Z86.010, Personal history of colonic polyps                       D12.3, Benign neoplasm of transverse colon (hepatic                        flexure or splenic flexure)                       D12.2,  Benign neoplasm of ascending colon CPT copyright 2018 American Medical Association. All rights reserved. The codes documented in this report are preliminary and upon coder review may  be revised to meet current compliance requirements. [name] MD, MD 03/02/2018 10:10:12 AM This report has been signed electronically. Number of Addenda: 0 Note Initiated On: 03/02/2018 9:27 AM Scope Withdrawal Time: 0 hours 7 minutes 43 seconds  Total Procedure Duration: 0 hours 12 minutes 48 seconds       [facility]

## 2018-03-02 NOTE — Op Note (Signed)
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 Procedure:            Pre-Anesthesia Assessment:                       - Prior to the procedure, a History and Physical was                        performed, and patient medications and allergies were                        reviewed. The patient's tolerance of previous                        anesthesia was also reviewed. The risks and benefits of                        the procedure and the sedation options and risks were                        discussed with the patient. All questions were                        answered, and informed consent was obtained. Prior                        Anticoagulants: The patient has taken no previous                        anticoagulant or antiplatelet agents. ASA Grade                        Assessment: II - A patient with mild systemic disease.                        After reviewing the risks and benefits, the patient was                        deemed in satisfactory condition to undergo the                        procedure.                       After obtaining informed consent, the endoscope was  passed under direct vision. Throughout the procedure,                        the patient's blood pressure, pulse, and oxygen                        saturations were monitored continuously. The was   introduced through the mouth, and advanced to the                        second part of duodenum. The upper GI endoscopy was                        accomplished without difficulty. The patient tolerated                        the procedure well. Findings:      The esophagus was normal.      Evidence of a previous surgical anastomosis was found in the cardia.       This was characterized by an intact staple line. Removal of a staple was       accomplished with a regular forceps.      The examined duodenum was normal. Impression:           - Normal esophagus.                       - A previous surgical anastomosis was found,                        characterized by an intact staple line.                       - Normal examined duodenum. Recommendation:       - Discharge patient to home.                       - Resume previous diet.                       - Continue present medications.                       - Perform a colonoscopy today. Procedure Code(s):    --- Professional ---                       [phone], Esophagogastroduodenoscopy, flexible, transoral;                        with removal of foreign body(s) Diagnosis Code(s):    --- Professional ---                       C16.9, Malignant neoplasm of stomach, unspecified                       Z98.0, Intestinal bypass and anastomosis status CPT copyright 2018 American Medical Association. All rights reserved. The codes documented in this report are preliminary and upon coder review may  be revised to meet current compliance requirements. [name] MD, MD 03/02/2018 9:54:22 AM This report has been signed electronically. Number of Addenda: 0 Note Initiated On: 03/02/2018 9:29 AM Total Procedure Duration:  0 hours 8 minutes 32 seconds       [facility]

## 2018-03-02 NOTE — Anesthesia Preprocedure Evaluation (Signed)
Anesthesia Evaluation  Patient identified by MRN, date of birth, ID band Patient awake    Reviewed: Allergy & Precautions, NPO status , Patient's Chart, lab work & pertinent test results  Airway Mallampati: II  TM Distance: >3 FB     Dental   Pulmonary Current Smoker,    breath sounds clear to auscultation       Cardiovascular hypertension, + CAD ( NSTEMI 2006 WITH STENTS IN 2008  ) and + Past MI   Rhythm:Regular Rate:Normal  HLD   Neuro/Psych    GI/Hepatic negative GI ROS,   Endo/Other    Renal/GU      Musculoskeletal   Abdominal   Peds  Hematology   Anesthesia Other Findings   Reproductive/Obstetrics                             Anesthesia Physical Anesthesia Plan  ASA: III  Anesthesia Plan: General   Post-op Pain Management:    Induction:   PONV Risk Score and Plan: Propofol infusion  Airway Management Planned: Nasal Cannula and Natural Airway  Additional Equipment:   Intra-op Plan:   Post-operative Plan:   Informed Consent: I have reviewed the patients History and Physical, chart, labs and discussed the procedure including the risks, benefits and alternatives for the proposed anesthesia with the patient or authorized representative who has indicated his/her understanding and acceptance.       Plan Discussed with: CRNA  Anesthesia Plan Comments:         Anesthesia Quick Evaluation

## 2018-03-02 NOTE — H&P (Signed)
Dylan Lame, MD [address] [place], [place] [address] [phone] Fax : [phone]  Primary Care Physician:  Dylan Patch, MD Primary Gastroenterologist:  Dr. Allen Norris  Pre-Procedure History & Physical: HPI:  Dylan Villanueva is a 78 y.o. male is here for an endoscopy and colonoscopy.   Past Medical History:  Diagnosis Date  . Cancer [facility]) 2006   prostate   . Colon polyps   . Hypertension   . Myocardial infarction ([facility])   . Smoker   . Squamous acanthoma of external ear, right     Past Surgical History:  Procedure Laterality Date  . APPENDECTOMY  1980  . CARDIAC SURGERY    . CORONARY ANGIOPLASTY WITH STENT PLACEMENT    . ESOPHAGOGASTRODUODENOSCOPY N/A 11/07/2017   Procedure: ESOPHAGOGASTRODUODENOSCOPY (EGD);  Surgeon: Dylan Husbands, MD;  Location: [facility] ORS;  Service: General;  Laterality: N/A;  . EUS N/A 07/13/2017   Procedure: FULL UPPER ENDOSCOPIC ULTRASOUND (EUS) RADIAL;  Surgeon: Dylan Schmidt, MD;  Location: [facility] ENDOSCOPY;  Service: Endoscopy;  Laterality: N/A;  . HIGH DOSE RADIATION IMPLANT INSERTION  2006  . LAPAROSCOPIC REMOVAL ABDOMINAL MASS N/A 11/07/2017   Procedure: LAPAROSCOPIC GASTRIC MASS RESECTION;  Surgeon: Dylan Husbands, MD;  Location: [facility] ORS;  Service: General;  Laterality: N/A;    Prior to Admission medications   Medication Sig Start Date End Date Taking? Authorizing Provider  amLODipine (NORVASC) 10 MG tablet Take 1 tablet (10 mg total) by mouth daily. 12/07/17  Yes Dylan Patch, MD  atorvastatin (LIPITOR) 40 MG tablet Take 1 tablet (40 mg total) by mouth daily. 12/07/17  Yes Dylan Patch, MD  cilostazol (PLETAL) 100 MG tablet Take 1 tablet (100 mg total) by mouth 2 (two) times daily. 12/07/17  Yes Dylan Patch, MD  lisinopril (PRINIVIL,ZESTRIL) 5 MG tablet Take 1 tablet (5 mg total) by mouth daily. 12/07/17  Yes Dylan Patch, MD  Multiple Vitamins-Minerals (MULTIVITAMIN ADULT PO) Take 1 capsule by mouth daily.   Yes  [provider]  SUPREP BOWEL PREP KIT 17.5-3.13-1.6 GM/177ML SOLN [place] UTD 02/23/18  Yes [provider]  Vitamin D, Cholecalciferol, 1000 units CAPS Take 1 capsule by mouth daily. 06/08/17  Yes Dylan Patch, MD  clopidogrel (PLAVIX) 75 MG tablet Take 1 tablet (75 mg total) by mouth daily. 12/07/17   Dylan Patch, MD  metoCLOPramide (REGLAN) 10 MG tablet Take 1 tablet (10 mg total) by mouth 3 (three) times daily. 02/28/18   Dylan Husbands, MD    Allergies as of 02/15/2018 - Review Complete 02/15/2018  Allergen Reaction Noted  . Penicillins Nausea Only 05/03/2017    Family History  Problem Relation Age of Onset  . Cancer Mother        pancreatic  . Dementia Father   . Cancer Sister        breast   . Aneurysm Brother     Social History   Socioeconomic History  . Marital status: Married    Spouse name: Jana Half  . Number of children: 2  . Years of education: Not on file  . Highest education level: Bachelor's degree (e.g., BA, AB, BS)  Occupational History  . Occupation: retired   Scientific laboratory technician  . Financial resource strain: Not hard at all  . Food insecurity:    Worry: Never true    Inability: Never true  . Transportation needs:    Medical: No    Non-medical: No  Tobacco Use  .  Smoking status: Current Every Day Smoker    Packs/day: 1.00    Years: 66.00    Pack years: 66.00    Types: Cigarettes  . Smokeless tobacco: Never Used  . Tobacco comment:      Substance and Sexual Activity  . Alcohol use: Yes    Alcohol/week: 3.0 standard drinks    Types: 3 Cans of beer per week  . Drug use: Never  . Sexual activity: Not Currently  Lifestyle  . Physical activity:    Days per week: 0 days    Minutes per session: 0 min  . Stress: Not at all  Relationships  . Social connections:    Talks on phone: Patient refused    Gets together: Patient refused    Attends religious service: Patient refused    Active member of club or organization: Patient  refused    Attends meetings of clubs or organizations: Patient refused    Relationship status: Married  . Intimate partner violence:    Fear of current or ex partner: No    Emotionally abused: No    Physically abused: No    Forced sexual activity: No  Other Topics Concern  . Not on file  Social History Narrative  . Not on file    Review of Systems: See HPI, otherwise negative ROS  Physical Exam: BP 119/61   Pulse 76   Temp 97.9 F (36.6 C) (Temporal)   Ht '5\' 7"'  (1.702 m)   Wt 62.6 kg   SpO2 100%   BMI 21.61 kg/m  General:   Alert,  pleasant and cooperative in NAD Head:  Normocephalic and atraumatic. Neck:  Supple; no masses or thyromegaly. Lungs:  Clear throughout to auscultation.    Heart:  Regular rate and rhythm. Abdomen:  Soft, nontender and nondistended. Normal bowel sounds, without guarding, and without rebound.   Neurologic:  Alert and  oriented x4;  grossly normal neurologically.  Impression/Plan: [name] is here for an endoscopy and colonoscopy to be performed for history of GIST and colon polyp. Last colonoscopy 2014 (reported by patient, done in [place])  Risks, benefits, limitations, and alternatives regarding  endoscopy and colonoscopy have been reviewed with the patient.  Questions have been answered.  All parties agreeable.   Dylan Lame, MD  03/02/2018, 9:27 AM

## 2018-03-02 NOTE — Anesthesia Postprocedure Evaluation (Signed)
Anesthesia Post Note  Patient: Dylan Villanueva  Procedure(s) Performed: COLONOSCOPY WITH PROPOFOL (N/A ) ESOPHAGOGASTRODUODENOSCOPY (EGD) WITH PROPOFOL (Throat) POLYPECTOMY (Rectum)  Patient location during evaluation: PACU Anesthesia Type: General Level of consciousness: awake Pain management: pain level controlled Vital Signs Assessment: post-procedure vital signs reviewed and stable Respiratory status: respiratory function stable Cardiovascular status: stable Postop Assessment: no signs of nausea or vomiting Anesthetic complications: no    Veda Canning

## 2018-03-05 ENCOUNTER — Encounter: Payer: Self-pay | Admitting: Gastroenterology

## 2018-03-05 DIAGNOSIS — D122 Benign neoplasm of ascending colon: Secondary | ICD-10-CM | POA: Diagnosis not present

## 2018-03-05 DIAGNOSIS — D123 Benign neoplasm of transverse colon: Secondary | ICD-10-CM | POA: Diagnosis not present

## 2018-03-06 ENCOUNTER — Ambulatory Visit
Admission: RE | Admit: 2018-03-06 | Discharge: 2018-03-06 | Disposition: A | Discharge status: Home / Self Care | Payer: Medicare Other | Source: Ambulatory Visit | Attending: Surgery | Admitting: Surgery

## 2018-03-06 ENCOUNTER — Other Ambulatory Visit
Admission: RE | Admit: 2018-03-06 | Discharge: 2018-03-06 | Disposition: A | Discharge status: Home / Self Care | Payer: Medicare Other | Source: Ambulatory Visit | Attending: Surgery | Admitting: Surgery

## 2018-03-06 ENCOUNTER — Encounter: Payer: Self-pay | Admitting: Gastroenterology

## 2018-03-06 DIAGNOSIS — K828 Other specified diseases of gallbladder: Secondary | ICD-10-CM | POA: Diagnosis not present

## 2018-03-06 DIAGNOSIS — K30 Functional dyspepsia: Secondary | ICD-10-CM | POA: Insufficient documentation

## 2018-03-06 LAB — CBC WITH DIFFERENTIAL/PLATELET
ABS IMMATURE GRANULOCYTES: 0.03 10*3/uL (ref 0.00–0.07)
Basophils Absolute: 0.1 10*3/uL (ref 0.0–0.1)
Basophils Relative: 1 %
Eosinophils Absolute: 0.3 10*3/uL (ref 0.0–0.5)
Eosinophils Relative: 4 %
HEMATOCRIT: 40.8 % (ref 39.0–52.0)
Hemoglobin: 13.8 g/dL (ref 13.0–17.0)
IMMATURE GRANULOCYTES: 0 %
Lymphocytes Relative: 25 %
Lymphs Abs: 1.8 10*3/uL (ref 0.7–4.0)
MCH: 34.1 pg — ABNORMAL HIGH (ref 26.0–34.0)
MCHC: 33.8 g/dL (ref 30.0–36.0)
MCV: 100.7 fL — ABNORMAL HIGH (ref 80.0–100.0)
Monocytes Absolute: 0.6 10*3/uL (ref 0.1–1.0)
Monocytes Relative: 8 %
Neutro Abs: 4.6 10*3/uL (ref 1.7–7.7)
Neutrophils Relative %: 62 %
Platelets: 241 10*3/uL (ref 150–400)
RBC: 4.05 MIL/uL — ABNORMAL LOW (ref 4.22–5.81)
RDW: 12.7 % (ref 11.5–15.5)
WBC: 7.3 10*3/uL (ref 4.0–10.5)
nRBC: 0 % (ref 0.0–0.2)

## 2018-03-06 LAB — COMPREHENSIVE METABOLIC PANEL
ALK PHOS: 62 U/L (ref 38–126)
ALT: 20 U/L (ref 0–44)
AST: 23 U/L (ref 15–41)
Albumin: 4.1 g/dL (ref 3.5–5.0)
Anion gap: 7 (ref 5–15)
BILIRUBIN TOTAL: 0.5 mg/dL (ref 0.3–1.2)
BUN: 16 mg/dL (ref 8–23)
CO2: 28 mmol/L (ref 22–32)
Calcium: 9 mg/dL (ref 8.9–10.3)
Chloride: 105 mmol/L (ref 98–111)
Creatinine, Ser: 0.96 mg/dL (ref 0.61–1.24)
GFR calc Af Amer: >60 mL/min (ref >60)
GFR calc non Af Amer: >60 mL/min (ref >60)
Glucose, Bld: 126 mg/dL — ABNORMAL HIGH (ref 70–99)
Potassium: 4 mmol/L (ref 3.5–5.1)
Sodium: 140 mmol/L (ref 135–145)
Total Protein: 6.9 g/dL (ref 6.5–8.1)

## 2018-03-07 ENCOUNTER — Telehealth: Payer: Self-pay | Admitting: *Deleted

## 2018-03-07 NOTE — Telephone Encounter (Signed)
Notified patient as instructed, patient pleased. Discussed follow-up appointments, patient agrees  

## 2018-03-07 NOTE — Telephone Encounter (Signed)
-----   Message from Jules Husbands, MD sent at 03/06/2018  5:03 PM EST ----- Please let him know that his U/S was nml as well as all his lab work. I will see him back as previously scheduled ----- Message ----- From: Interface, Rad Results In Sent: 03/06/2018   9:57 AM EST To: Jules Husbands, MD

## 2018-03-16 ENCOUNTER — Telehealth (INDEPENDENT_AMBULATORY_CARE_PROVIDER_SITE_OTHER): Payer: Self-pay | Admitting: Vascular Surgery

## 2018-03-20 ENCOUNTER — Other Ambulatory Visit: Payer: Self-pay | Admitting: Family Medicine

## 2018-03-20 DIAGNOSIS — I2510 Atherosclerotic heart disease of native coronary artery without angina pectoris: Secondary | ICD-10-CM

## 2018-03-20 DIAGNOSIS — I70219 Atherosclerosis of native arteries of extremities with intermittent claudication, unspecified extremity: Secondary | ICD-10-CM

## 2018-03-28 ENCOUNTER — Encounter: Payer: Self-pay | Admitting: Surgery

## 2018-03-28 ENCOUNTER — Ambulatory Visit (INDEPENDENT_AMBULATORY_CARE_PROVIDER_SITE_OTHER): Payer: Medicare Other | Admitting: Surgery

## 2018-03-28 ENCOUNTER — Other Ambulatory Visit: Payer: Self-pay

## 2018-03-28 VITALS — BP 146/84 | HR 75 | Temp 97.7°F | Resp 14 | Ht 67.0 in | Wt 149.0 lb

## 2018-03-28 DIAGNOSIS — I70219 Atherosclerosis of native arteries of extremities with intermittent claudication, unspecified extremity: Secondary | ICD-10-CM | POA: Diagnosis not present

## 2018-03-28 DIAGNOSIS — K30 Functional dyspepsia: Secondary | ICD-10-CM | POA: Diagnosis not present

## 2018-03-28 MED ORDER — METOCLOPRAMIDE HCL 10 MG PO TABS: 10.0000 mg | ORAL_TABLET | Freq: Three times a day (TID) | ORAL | 2 refills | Status: DC

## 2018-03-28 NOTE — Patient Instructions (Addendum)
The patient is aware to call back for any questions or new concerns. Recommend dietary/meal changes and see if symptoms improve

## 2018-03-28 NOTE — Progress Notes (Signed)
Outpatient Surgical Follow Up  03/28/2018  Dylan Villanueva is an 78 y.o. male.   Chief Complaint  Patient presents with  . Follow-up    f/u delayed gastric emptying; to have labs and ruq abd u/s on 03-06-18    HPI: Dylan Villanueva following up after recent EGD and colonoscopy.  He did have a history of a wedge gastric resection for gist tumor.  His main issue is eructation.  No abdominal pain.  No fevers no chills no vomiting.  Nausea.  EGD and colonoscopy images personally reviewed.  The staple line is intact.  Dr.wohl moved 1 a staple.  There is no evidence of leak.  There is no indirect evidence of retained food on EGD.   Past Medical History:  Diagnosis Date  . [facility]) 2006   prostate   . Colon polyps   . Hypertension   . Myocardial infarction ([place])   . Smoker   . Squamous acanthoma of external ear, right     Past Surgical History:  Procedure Laterality Date  . APPENDECTOMY  1980  . CARDIAC SURGERY    . COLONOSCOPY WITH PROPOFOL N/A 03/02/2018   Procedure: COLONOSCOPY WITH PROPOFOL;  Surgeon: [name], MD;  Location: [place];  Service: Endoscopy;  Laterality: N/A;  . CORONARY ANGIOPLASTY WITH STENT PLACEMENT    . ESOPHAGOGASTRODUODENOSCOPY N/A 11/07/2017   Procedure: ESOPHAGOGASTRODUODENOSCOPY (EGD);  Surgeon: Jules Husbands, MD;  Location: [facility];  Service: General;  Laterality: N/A;  . ESOPHAGOGASTRODUODENOSCOPY (EGD) WITH PROPOFOL  03/02/2018   Procedure: ESOPHAGOGASTRODUODENOSCOPY (EGD) WITH PROPOFOL;  Surgeon: [name], MD;  Location: [place];  Service: Endoscopy;;  . EUS N/A 07/13/2017   Procedure: FULL UPPER ENDOSCOPIC ULTRASOUND (EUS) RADIAL;  Surgeon: Jola Schmidt, MD;  Location: [facility];  Service: Endoscopy;  Laterality: N/A;  . HIGH DOSE RADIATION IMPLANT INSERTION  2006  . LAPAROSCOPIC REMOVAL ABDOMINAL MASS N/A 11/07/2017   Procedure: LAPAROSCOPIC GASTRIC MASS RESECTION;  Surgeon: Jules Husbands, MD;  Location: [facility];  Service:  General;  Laterality: N/A;  . POLYPECTOMY  03/02/2018   Procedure: POLYPECTOMY;  Surgeon: [name], MD;  Location: [place];  Service: Endoscopy;;    Family History  Problem Relation Age of Onset  . Cancer Mother        pancreatic  . Dementia Father   . Cancer Sister        breast   . Aneurysm Brother     Social History:  reports that he has been smoking cigarettes. He has a 66.00 pack-year smoking history. He has never used smokeless tobacco. He reports current alcohol use of about 3.0 standard drinks of alcohol per week. He reports that he does not use drugs.  Allergies:  Allergies  Allergen Reactions  . Penicillins Nausea Only    Has patient had a PCN reaction causing immediate rash, facial/tongue/throat swelling, SOB or lightheadedness with hypotension: no Has patient had a PCN reaction causing severe rash involving mucus membranes or skin necrosis: no Has patient had a PCN reaction that required hospitalization: no Has patient had a PCN reaction occurring within the last 10 years: no If all of the above answers are "NO", then may proceed with Cephalosporin use.     Medications reviewed.    ROS Full ROS performed and is otherwise negative other than what is stated in HPI   BP (!) 146/84   Pulse 75   Temp 97.7 F (36.5 C) (Temporal)   Resp 14  Ht 5\' 7"  (1.702 m)   Wt 149 lb (67.6 kg)   BMI 23.34 kg/m   Physical Exam Vitals signs and nursing note reviewed. Exam conducted with a chaperone present.  Constitutional:      General: He is not in acute distress.    Appearance: Normal appearance. He is normal weight.  Pulmonary:     Effort: Pulmonary effort is normal. No respiratory distress.     Breath sounds: No stridor.  Abdominal:     General: Abdomen is flat. There is no distension.     Palpations: There is no mass.     Tenderness: There is no abdominal tenderness. There is no guarding or rebound.     Hernia: No hernia is present.      Comments: Scar upper midline  Musculoskeletal: Normal range of motion.        General: No swelling.  Skin:    General: Skin is warm and dry.     Capillary Refill: Capillary refill takes less than 2 seconds.     Coloration: Skin is not jaundiced.  Neurological:     General: No focal deficit present.     Mental Status: He is alert and oriented to person, place, and time.  Psychiatric:        Mood and Affect: Mood normal.        Behavior: Behavior normal.        Thought Content: Thought content normal.        Judgment: Judgment normal.     Assessment/Plan: Paresthesia likely attributed to gastroparesis.  Patient has been doing somewhat better with Reglan and he wishes to continue the Reglan at this time.  I will see him back to Dr. Jennette Banker for any additional recommendation regarding his GI issues.  No need for any surgical intervention at this time. I d/w him about gastric emptying study, at this time he wishes to wait RTC prn Greater than 50% of the 20 minutes  visit was spent in counseling/coordination of care   Caroleen Hamman, MD [place] Surgeon

## 2018-04-20 ENCOUNTER — Other Ambulatory Visit: Payer: Self-pay | Admitting: Family Medicine

## 2018-04-20 DIAGNOSIS — I70219 Atherosclerosis of native arteries of extremities with intermittent claudication, unspecified extremity: Secondary | ICD-10-CM

## 2018-05-29 ENCOUNTER — Other Ambulatory Visit: Payer: Self-pay | Admitting: Family Medicine

## 2018-05-29 DIAGNOSIS — I70219 Atherosclerosis of native arteries of extremities with intermittent claudication, unspecified extremity: Secondary | ICD-10-CM

## 2018-06-04 ENCOUNTER — Other Ambulatory Visit: Payer: Self-pay | Admitting: Family Medicine

## 2018-06-04 ENCOUNTER — Ambulatory Visit: Payer: Medicare Other

## 2018-06-04 DIAGNOSIS — I70219 Atherosclerosis of native arteries of extremities with intermittent claudication, unspecified extremity: Secondary | ICD-10-CM

## 2018-06-04 DIAGNOSIS — I2510 Atherosclerotic heart disease of native coronary artery without angina pectoris: Secondary | ICD-10-CM

## 2018-06-08 ENCOUNTER — Other Ambulatory Visit: Payer: Self-pay

## 2018-06-08 ENCOUNTER — Encounter: Payer: Self-pay | Admitting: Family Medicine

## 2018-06-08 ENCOUNTER — Ambulatory Visit (INDEPENDENT_AMBULATORY_CARE_PROVIDER_SITE_OTHER): Payer: Medicare Other | Admitting: Family Medicine

## 2018-06-08 DIAGNOSIS — I2510 Atherosclerotic heart disease of native coronary artery without angina pectoris: Secondary | ICD-10-CM | POA: Diagnosis not present

## 2018-06-08 DIAGNOSIS — I10 Essential (primary) hypertension: Secondary | ICD-10-CM

## 2018-06-08 DIAGNOSIS — I739 Peripheral vascular disease, unspecified: Secondary | ICD-10-CM

## 2018-06-08 DIAGNOSIS — I70219 Atherosclerosis of native arteries of extremities with intermittent claudication, unspecified extremity: Secondary | ICD-10-CM | POA: Diagnosis not present

## 2018-06-08 DIAGNOSIS — E782 Mixed hyperlipidemia: Secondary | ICD-10-CM

## 2018-06-08 MED ORDER — AMLODIPINE BESYLATE 10 MG PO TABS: 10.0000 mg | ORAL_TABLET | Freq: Every day | ORAL | 1 refills | Status: DC

## 2018-06-08 MED ORDER — ATORVASTATIN CALCIUM 40 MG PO TABS: 40.0000 mg | ORAL_TABLET | Freq: Every day | ORAL | 1 refills | Status: DC

## 2018-06-08 MED ORDER — LISINOPRIL 5 MG PO TABS: 5.0000 mg | ORAL_TABLET | Freq: Every day | ORAL | 1 refills | Status: DC

## 2018-06-08 MED ORDER — CILOSTAZOL 100 MG PO TABS: ORAL_TABLET | ORAL | 1 refills | Status: DC

## 2018-06-08 MED ORDER — CLOPIDOGREL BISULFATE 75 MG PO TABS: ORAL_TABLET | ORAL | 1 refills | Status: DC

## 2018-06-08 NOTE — Progress Notes (Signed)
Date:  06/08/2018   Name:  Dylan Villanueva   DOB:  January 12, 1941   MRN:  [id number]   Chief Complaint: Hypertension; Hyperlipidemia; Coronary Artery Disease; and PAD  Hypertension  This is a chronic problem. The current episode started more than 1 year ago. The problem is unchanged. The problem is controlled. Pertinent negatives include no anxiety, blurred vision, chest pain, headaches, malaise/fatigue, neck pain, orthopnea, palpitations, peripheral edema, PND, shortness of breath or sweats. There are no associated agents to hypertension. There are no known risk factors for coronary artery disease. Past treatments include calcium channel blockers and ACE inhibitors. The current treatment provides moderate improvement. There are no compliance problems.  There is no history of angina, kidney disease, CAD/MI, CVA, heart failure, left ventricular hypertrophy, PVD or retinopathy. There is no history of chronic renal disease, a hypertension causing med or renovascular disease.  Hyperlipidemia  This is a chronic problem. The current episode started more than 1 year ago. The problem is controlled. Recent lipid tests were reviewed and are normal. He has no history of chronic renal disease, diabetes, hypothyroidism, liver disease, obesity or nephrotic syndrome. Pertinent negatives include no chest pain, focal sensory loss, focal weakness, leg pain, myalgias or shortness of breath. Current antihyperlipidemic treatment includes statins. The current treatment provides moderate improvement of lipids. There are no compliance problems.   Coronary Artery Disease  Presents for follow-up visit. Pertinent negatives include no chest pain, chest pressure, chest tightness, dizziness, leg swelling, muscle weakness, palpitations, shortness of breath or weight gain. Risk factors include hyperlipidemia and hypertension. Risk factors do not include obesity. The symptoms have been improving. Compliance with diet is good. Compliance  with exercise is good. Compliance with medications is good.  Other  This is a chronic problem. The current episode started more than 1 year ago. The problem occurs daily. The problem has been unchanged. Pertinent negatives include no abdominal pain, anorexia, arthralgias, change in bowel habit, chest pain, chills, congestion, coughing, diaphoresis, fatigue, fever, headaches, joint swelling, myalgias, nausea, neck pain, numbness, rash, sore throat, swollen glands, urinary symptoms, vertigo, visual change, vomiting or weakness. The symptoms are aggravated by walking ("depends on how far"). Treatments tried: medication. The treatment provided mild relief.    Review of Systems  Constitutional: Negative for chills, diaphoresis, fatigue, fever, malaise/fatigue and weight gain.  HENT: Negative for congestion, drooling, ear discharge, ear pain and sore throat.   Eyes: Negative for blurred vision.  Respiratory: Negative for cough, chest tightness, shortness of breath and wheezing.   Cardiovascular: Negative for chest pain, palpitations, orthopnea, leg swelling and PND.  Gastrointestinal: Negative for abdominal pain, anorexia, blood in stool, change in bowel habit, constipation, diarrhea, nausea and vomiting.  Endocrine: Negative for polydipsia.  Genitourinary: Negative for dysuria, frequency, hematuria and urgency.  Musculoskeletal: Negative for arthralgias, back pain, joint swelling, myalgias, muscle weakness and neck pain.  Skin: Negative for rash.  Allergic/Immunologic: Negative for environmental allergies.  Neurological: Negative for dizziness, vertigo, focal weakness, weakness, numbness and headaches.  Hematological: Does not bruise/bleed easily.  Psychiatric/Behavioral: Negative for suicidal ideas. The patient is not nervous/anxious.     Patient Active Problem List   Diagnosis Date Noted  . Benign gastrointestinal stromal tumor (GIST)   . History of colonic polyps   . Benign neoplasm of  transverse colon   . Benign neoplasm of ascending colon   . Gastric mass   . Bilateral carotid artery stenosis 08/30/2017  . Benign essential HTN 05/25/2017  . Non-ST elevation  myocardial infarction (NSTEMI), subendocardial infarction, subsequent episode of care ([place]) 05/25/2017  . Chronic venous insufficiency 05/08/2017  . Coronary artery disease involving native coronary artery of native heart 05/08/2017  . Hyperlipidemia, mixed 05/08/2017  . Hypertension 05/08/2017  . History of prostate cancer 05/08/2017  . Gait instability 05/08/2017  . Vitamin D deficiency 05/08/2017  . Presence of stent in coronary artery in patient with coronary artery disease 05/08/2017  . History of shingles 05/08/2017    Allergies  Allergen Reactions  . Penicillins Nausea Only    Has patient had a PCN reaction causing immediate rash, facial/tongue/throat swelling, SOB or lightheadedness with hypotension: no Has patient had a PCN reaction causing severe rash involving mucus membranes or skin necrosis: no Has patient had a PCN reaction that required hospitalization: no Has patient had a PCN reaction occurring within the last 10 years: no If all of the above answers are "NO", then may proceed with Cephalosporin use.     Past Surgical History:  Procedure Laterality Date  . APPENDECTOMY  1980  . CARDIAC SURGERY    . COLONOSCOPY WITH PROPOFOL N/A 03/02/2018   Procedure: COLONOSCOPY WITH PROPOFOL;  Surgeon: [name], MD;  Location: [place];  Service: Endoscopy;  Laterality: N/A;  . CORONARY ANGIOPLASTY WITH STENT PLACEMENT    . ESOPHAGOGASTRODUODENOSCOPY N/A 11/07/2017   Procedure: ESOPHAGOGASTRODUODENOSCOPY (EGD);  Surgeon: Jules Husbands, MD;  Location: [facility];  Service: General;  Laterality: N/A;  . ESOPHAGOGASTRODUODENOSCOPY (EGD) WITH PROPOFOL  03/02/2018   Procedure: ESOPHAGOGASTRODUODENOSCOPY (EGD) WITH PROPOFOL;  Surgeon: [name], MD;  Location: [place];  Service:  Endoscopy;;  . EUS N/A 07/13/2017   Procedure: FULL UPPER ENDOSCOPIC ULTRASOUND (EUS) RADIAL;  Surgeon: Jola Schmidt, MD;  Location: [facility];  Service: Endoscopy;  Laterality: N/A;  . HIGH DOSE RADIATION IMPLANT INSERTION  2006  . LAPAROSCOPIC REMOVAL ABDOMINAL MASS N/A 11/07/2017   Procedure: LAPAROSCOPIC GASTRIC MASS RESECTION;  Surgeon: Jules Husbands, MD;  Location: [facility];  Service: General;  Laterality: N/A;  . POLYPECTOMY  03/02/2018   Procedure: POLYPECTOMY;  Surgeon: [name], MD;  Location: [place];  Service: Endoscopy;;    Social History   Tobacco Use  . Smoking status: Current Every Day Smoker    Packs/day: 1.00    Years: 66.00    Pack years: 66.00    Types: Cigarettes  . Smokeless tobacco: Never Used  . Tobacco comment:      Substance Use Topics  . Alcohol use: Yes    Alcohol/week: 3.0 standard drinks    Types: 3 Cans of beer per week  . Drug use: Never     Medication list has been reviewed and updated.  Current Meds  Medication Sig  . amLODipine (NORVASC) 10 MG tablet Take 1 tablet (10 mg total) by mouth daily.  Marland Kitchen atorvastatin (LIPITOR) 40 MG tablet Take 1 tablet (40 mg total) by mouth daily.  . cilostazol (PLETAL) 100 MG tablet TAKE 1 TABLET(100 MG) BY MOUTH TWICE DAILY  . clopidogrel (PLAVIX) 75 MG tablet TAKE 1 TABLET(75 MG) BY MOUTH DAILY  . lisinopril (PRINIVIL,ZESTRIL) 5 MG tablet Take 1 tablet (5 mg total) by mouth daily.  . metoCLOPramide (REGLAN) 10 MG tablet Take 1 tablet (10 mg total) by mouth 3 (three) times daily.  . Multiple Vitamins-Minerals (MULTIVITAMIN ADULT PO) Take 1 capsule by mouth daily.  . Vitamin D, Cholecalciferol, 1000 units CAPS Take 1 capsule by mouth daily.    PHQ 2/9 Scores 06/08/2018 06/01/2017  05/08/2017  PHQ - 2 Score 0 0 0  PHQ- 9 Score 0 0 -    BP Readings from Last 3 Encounters:  06/08/18 130/70  03/28/18 (!) 146/84  03/02/18 117/65    Physical Exam Vitals signs and nursing note reviewed.   Constitutional:      Appearance: He is normal weight.  HENT:     Head: Normocephalic.     Right Ear: Tympanic membrane, ear canal and external ear normal.     Left Ear: Tympanic membrane, ear canal and external ear normal.     Nose: Nose normal. No congestion or rhinorrhea.  Eyes:     General: No scleral icterus.       Right eye: No discharge.        Left eye: No discharge.     Conjunctiva/sclera: Conjunctivae normal.     Pupils: Pupils are equal, round, and reactive to light.  Neck:     Musculoskeletal: Normal range of motion and neck supple.     Thyroid: No thyromegaly.     Vascular: No JVD.     Trachea: No tracheal deviation.  Cardiovascular:     Rate and Rhythm: Normal rate and regular rhythm.     Pulses: Normal pulses.     Heart sounds: Normal heart sounds. No murmur. No friction rub. No gallop.   Pulmonary:     Effort: No respiratory distress.     Breath sounds: Normal breath sounds. No stridor. No wheezing, rhonchi or rales.  Chest:     Chest wall: No tenderness.  Abdominal:     General: Bowel sounds are normal.     Palpations: Abdomen is soft. There is no mass.     Tenderness: There is no abdominal tenderness. There is no guarding or rebound.  Musculoskeletal: Normal range of motion.        General: No tenderness.  Lymphadenopathy:     Cervical: No cervical adenopathy.  Skin:    General: Skin is warm.     Findings: No rash.  Neurological:     Mental Status: He is alert and oriented to person, place, and time.     Cranial Nerves: No cranial nerve deficit.     Deep Tendon Reflexes: Reflexes are normal and symmetric.     Wt Readings from Last 3 Encounters:  06/08/18 145 lb (65.8 kg)  03/28/18 149 lb (67.6 kg)  03/02/18 138 lb (62.6 kg)    BP 130/70   Pulse 80   Ht 5\' 7"  (1.702 m)   Wt 145 lb (65.8 kg)   BMI 22.71 kg/m   Assessment and Plan: 1. 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  Controlled.   Patient is currently taking cilostazol 100 mg twice a day as well as Plavix 75 mg once a day.  And is stable with this regimen - cilostazol (PLETAL) 100 MG tablet; TAKE 1 TABLET(100 MG) BY MOUTH TWICE DAILY  Dispense: 180 tablet; Refill: 1 - clopidogrel (PLAVIX) 75 MG tablet; TAKE 1 TABLET(75 MG) BY MOUTH DAILY  Dispense: 90 tablet; Refill: 1 - lisinopril (ZESTRIL) 5 MG tablet; Take 1 tablet (5 mg total) by mouth daily.  Dispense: 90 tablet; Refill: 1 - amLODipine (NORVASC) 10 MG tablet; Take 1 tablet (10 mg total) by mouth daily.  Dispense: 90 tablet; Refill: 1  2. Coronary artery disease involving native coronary artery of native heart without angina pectoris Chronic.  Controlled.  Patient has no episodes of angina nor congestive heart failure symptoms.  Continue current regimen of amlodipine lisinopril and Plavix. - clopidogrel (PLAVIX) 75 MG tablet; TAKE 1 TABLET(75 MG) BY MOUTH DAILY  Dispense: 90 tablet; Refill: 1 - lisinopril (ZESTRIL) 5 MG tablet; Take 1 tablet (5 mg total) by mouth daily.  Dispense: 90 tablet; Refill: 1 - amLODipine (NORVASC) 10 MG tablet; Take 1 tablet (10 mg total) by mouth daily.  Dispense: 90 tablet; Refill: 1  3. PAD (peripheral artery disease) (HCC) Chronic.  Controlled.  Patient is has some breakthrough claudication depending on the distance and the density of her walk.  Will continue current Plavix and Pletal as well as her hypertensive regimen. - lisinopril (ZESTRIL) 5 MG tablet; Take 1 tablet (5 mg total) by mouth daily.  Dispense: 90 tablet; Refill: 1 - amLODipine (NORVASC) 10 MG tablet; Take 1 tablet (10 mg total) by mouth daily.  Dispense: 90 tablet; Refill: 1  4. Essential hypertension Chronic.  Controlled.  Continue lisinopril 5 mg once a day and amlodipine 10 mg once a day. - lisinopril (ZESTRIL) 5 MG tablet; Take 1 tablet (5 mg total) by mouth daily.  Dispense: 90 tablet; Refill: 1 - amLODipine (NORVASC) 10 MG tablet; Take 1 tablet (10 mg total) by mouth  daily.  Dispense: 90 tablet; Refill: 1  5. Hyperlipidemia, mixed Chronic.  Controlled.  Continue atorvastatin 40 mg once a day. - atorvastatin (LIPITOR) 40 MG tablet; Take 1 tablet (40 mg total) by mouth daily.  Dispense: 90 tablet; Refill: 1

## 2018-06-25 ENCOUNTER — Ambulatory Visit (INDEPENDENT_AMBULATORY_CARE_PROVIDER_SITE_OTHER): Payer: Medicare Other

## 2018-06-25 ENCOUNTER — Encounter (INDEPENDENT_AMBULATORY_CARE_PROVIDER_SITE_OTHER): Payer: Self-pay | Admitting: Nurse Practitioner

## 2018-06-25 ENCOUNTER — Ambulatory Visit (INDEPENDENT_AMBULATORY_CARE_PROVIDER_SITE_OTHER): Payer: Medicare Other | Admitting: Nurse Practitioner

## 2018-06-25 ENCOUNTER — Ambulatory Visit (INDEPENDENT_AMBULATORY_CARE_PROVIDER_SITE_OTHER): Payer: Medicare Other | Admitting: Vascular Surgery

## 2018-06-25 ENCOUNTER — Other Ambulatory Visit: Payer: Self-pay

## 2018-06-25 VITALS — BP 133/73 | HR 80 | Resp 10 | Ht 67.0 in | Wt 146.0 lb

## 2018-06-25 DIAGNOSIS — E782 Mixed hyperlipidemia: Secondary | ICD-10-CM

## 2018-06-25 DIAGNOSIS — F17210 Nicotine dependence, cigarettes, uncomplicated: Secondary | ICD-10-CM | POA: Diagnosis not present

## 2018-06-25 DIAGNOSIS — I70213 Atherosclerosis of native arteries of extremities with intermittent claudication, bilateral legs: Secondary | ICD-10-CM

## 2018-06-25 DIAGNOSIS — Z79899 Other long term (current) drug therapy: Secondary | ICD-10-CM

## 2018-06-25 DIAGNOSIS — I70219 Atherosclerosis of native arteries of extremities with intermittent claudication, unspecified extremity: Secondary | ICD-10-CM

## 2018-06-25 DIAGNOSIS — I10 Essential (primary) hypertension: Secondary | ICD-10-CM | POA: Diagnosis not present

## 2018-06-25 DIAGNOSIS — F17200 Nicotine dependence, unspecified, uncomplicated: Secondary | ICD-10-CM | POA: Insufficient documentation

## 2018-06-25 DIAGNOSIS — Z7902 Long term (current) use of antithrombotics/antiplatelets: Secondary | ICD-10-CM | POA: Diagnosis not present

## 2018-06-25 NOTE — Progress Notes (Signed)
SUBJECTIVE:  Patient ID: Dylan Villanueva, male    DOB: November 20, 1940, 78 y.o.   MRN: [id number] Chief Complaint  Patient presents with  . Follow-up    HPI  Dylan Villanueva is a 78 y.o. male The patient returns to the office for followup and review of the noninvasive studies. There has been a significant deterioration in the lower extremity symptoms.  The patient notes interval shortening of their claudication distance and development of mild rest pain symptoms. No new ulcers or wounds have occurred since the last visit.  There have been no significant changes to the patient's overall health care.  The patient denies amaurosis fugax or recent TIA symptoms. There are no recent neurological changes noted. The patient denies history of DVT, PE or superficial thrombophlebitis. The patient denies recent episodes of angina or shortness of breath.   ABI's Rt=0.85 and Lt=0.56 (no previous) Duplex US of the lower extremity arterial system shows triphasic tibial artery waveforms within the right lower extremity with monophasic waveforms in the left.  Monophasic tibial artery waveforms and dampened toe waveforms on the left lower extremity.  Past Medical History:  Diagnosis Date  . Cancer [facility]) 2006   prostate   . Colon polyps   . Hypertension   . Myocardial infarction ([place])   . Smoker   . Squamous acanthoma of external ear, right     Past Surgical History:  Procedure Laterality Date  . APPENDECTOMY  1980  . CARDIAC SURGERY    . COLONOSCOPY WITH PROPOFOL N/A 03/02/2018   Procedure: COLONOSCOPY WITH PROPOFOL;  Surgeon: [name], MD;  Location: [facility];  Service: Endoscopy;  Laterality: N/A;  . CORONARY ANGIOPLASTY WITH STENT PLACEMENT    . ESOPHAGOGASTRODUODENOSCOPY N/A 11/07/2017   Procedure: ESOPHAGOGASTRODUODENOSCOPY (EGD);  Surgeon: Jules Husbands, MD;  Location: [facility];  Service: General;  Laterality: N/A;  . ESOPHAGOGASTRODUODENOSCOPY (EGD) WITH PROPOFOL  03/02/2018   Procedure: ESOPHAGOGASTRODUODENOSCOPY (EGD) WITH PROPOFOL;  Surgeon: [name], MD;  Location: [facility];  Service: Endoscopy;;  . EUS N/A 07/13/2017   Procedure: FULL UPPER ENDOSCOPIC ULTRASOUND (EUS) RADIAL;  Surgeon: Jola Schmidt, MD;  Location: [facility];  Service: Endoscopy;  Laterality: N/A;  . HIGH DOSE RADIATION IMPLANT INSERTION  2006  . LAPAROSCOPIC REMOVAL ABDOMINAL MASS N/A 11/07/2017   Procedure: LAPAROSCOPIC GASTRIC MASS RESECTION;  Surgeon: Jules Husbands, MD;  Location: [facility];  Service: General;  Laterality: N/A;  . POLYPECTOMY  03/02/2018   Procedure: POLYPECTOMY;  Surgeon: [name], MD;  Location: [facility];  Service: Endoscopy;;    Social History   Socioeconomic History  . Marital status: Married    Spouse name: Jana Half  . Number of children: 2  . Years of education: Not on file  . Highest education level: Bachelor's degree (e.g., BA, AB, BS)  Occupational History  . Occupation: retired   Scientific laboratory technician  . Financial resource strain: Not hard at all  . Food insecurity:    Worry: Never true    Inability: Never true  . Transportation needs:    Medical: No    Non-medical: No  Tobacco Use  . Smoking status: Current Every Day Smoker    Packs/day: 1.00    Years: 66.00    Pack years: 66.00    Types: Cigarettes  . Smokeless tobacco: Never Used  . Tobacco comment:      Substance and Sexual Activity  . Alcohol use: Yes    Alcohol/week: 3.0 standard drinks    Types:  3 Cans of beer per week  . Drug use: Never  . Sexual activity: Not Currently  Lifestyle  . Physical activity:    Days per week: 0 days    Minutes per session: 0 min  . Stress: Not at all  Relationships  . Social connections:    Talks on phone: Patient refused    Gets together: Patient refused    Attends religious service: Patient refused    Active member of club or organization: Patient refused    Attends meetings of clubs or organizations: Patient refused     Relationship status: Married  . Intimate partner violence:    Fear of current or ex partner: No    Emotionally abused: No    Physically abused: No    Forced sexual activity: No  Other Topics Concern  . Not on file  Social History Narrative  . Not on file    Family History  Problem Relation Age of Onset  . Cancer Mother        pancreatic  . Dementia Father   . Cancer Sister        breast   . Aneurysm Brother     Allergies  Allergen Reactions  . Penicillins Nausea Only    Has patient had a PCN reaction causing immediate rash, facial/tongue/throat swelling, SOB or lightheadedness with hypotension: no Has patient had a PCN reaction causing severe rash involving mucus membranes or skin necrosis: no Has patient had a PCN reaction that required hospitalization: no Has patient had a PCN reaction occurring within the last 10 years: no If all of the above answers are "NO", then may proceed with Cephalosporin use.      Review of Systems   Review of Systems: Negative Unless Checked Constitutional: [] Weight loss  [] Fever  [] Chills Cardiac: [] Chest pain   []  Atrial Fibrillation  [] Palpitations   [] Shortness of breath when laying flat   [] Shortness of breath with exertion. [] Shortness of breath at rest Vascular:  [x] Pain in legs with walking   [x] Pain in legs with standing [] Pain in legs when laying flat   [x] Claudication    [x] Pain in feet when laying flat    [] History of DVT   [] Phlebitis   [] Swelling in legs   [] Varicose veins   [] Non-healing ulcers Pulmonary:   [] Uses home oxygen   [] Productive cough   [] Hemoptysis   [] Wheeze  [] COPD   [] Asthma Neurologic:  [] Dizziness   [] Seizures  [] Blackouts [] History of stroke   [] History of TIA  [] Aphasia   [] Temporary Blindness   [] Weakness or numbness in arm   [x] Weakness or numbness in leg Musculoskeletal:   [] Joint swelling   [] Joint pain   [] Low back pain  []  History of Knee Replacement [x] Arthritis [] back Surgeries  []  Spinal Stenosis     Hematologic:  [] Easy bruising  [] Easy bleeding   [] Hypercoagulable state   [] Anemic Gastrointestinal:  [] Diarrhea   [] Vomiting  [] Gastroesophageal reflux/heartburn   [] Difficulty swallowing. [] Abdominal pain Genitourinary:  [] Chronic kidney disease   [] Difficult urination  [] Anuric   [] Blood in urine [] Frequent urination  [] Burning with urination   [] Hematuria Skin:  [] Rashes   [] Ulcers [] Wounds Psychological:  [] History of anxiety   []  History of major depression  []  Memory Difficulties      OBJECTIVE:   Physical Exam  BP 133/73 (BP Location: Left Arm, Patient Position: Sitting, Cuff Size: Small)   Pulse 80   Resp 10   Ht 5\' 7"  (1.702 m)   Wt 146  lb (66.2 kg)   BMI 22.87 kg/m   Gen: WD/WN, NAD Head: [place]/AT, No temporalis wasting.  Ear/Nose/Throat: Hearing grossly intact, nares w/o erythema or drainage Eyes: PER, EOMI, sclera nonicteric.  Neck: Supple, no masses.  No JVD.  Pulmonary:  Good air movement, no use of accessory muscles.  Cardiac: RRR Vascular:  Vessel Right Left  Radial Palpable Palpable  Dorsalis Pedis Not Palpable Not Palpable  Posterior Tibial Not Palpable Not Palpable   Gastrointestinal: soft, non-distended. No guarding/no peritoneal signs.  Musculoskeletal: M/S 5/5 throughout.  No deformity or atrophy.  Neurologic: Pain and light touch intact in extremities.  Symmetrical.  Speech is fluent. Motor exam as listed above. Psychiatric: Judgment intact, Mood & affect appropriate for pt's clinical situation. Dermatologic: No Venous rashes. No Ulcers Noted.  No changes consistent with cellulitis. Lymph : No Cervical lymphadenopathy, no lichenification or skin changes of chronic lymphedema.       ASSESSMENT AND PLAN:  1. Atherosclerosis of native artery of both lower extremities with intermittent claudication (HCC) Recommend:  The patient has experienced increased symptoms and is now describing lifestyle limiting claudication and mild rest pain.   Given the  severity of the patient's left  lower extremity symptoms the patient should undergo angiography and intervention.  Risk and benefits were reviewed the patient.  Indications for the procedure were reviewed.  All questions were answered, the patient agrees to proceed.   The patient should continue walking and begin a more formal exercise program.  The patient should continue antiplatelet therapy and aggressive treatment of the lipid abnormalities  The patient will follow up with me after the angiogram.   2. Hyperlipidemia, mixed Continue statin as ordered and reviewed, no changes at this time   3. Essential hypertension Continue antihypertensive medications as already ordered, these medications have been reviewed and there are no changes at this time.   4. Current every day smoker Smoking cessation was discussed, 3-10 minutes spent on this topic specifically    Current Outpatient Medications on File Prior to Visit  Medication Sig Dispense Refill  . amLODipine (NORVASC) 10 MG tablet Take 1 tablet (10 mg total) by mouth daily. 90 tablet 1  . atorvastatin (LIPITOR) 40 MG tablet Take 1 tablet (40 mg total) by mouth daily. 90 tablet 1  . cilostazol (PLETAL) 100 MG tablet TAKE 1 TABLET(100 MG) BY MOUTH TWICE DAILY 180 tablet 1  . clopidogrel (PLAVIX) 75 MG tablet TAKE 1 TABLET(75 MG) BY MOUTH DAILY 90 tablet 1  . lisinopril (ZESTRIL) 5 MG tablet Take 1 tablet (5 mg total) by mouth daily. 90 tablet 1  . metoCLOPramide (REGLAN) 10 MG tablet Take 1 tablet (10 mg total) by mouth 3 (three) times daily. 60 tablet 0  . Multiple Vitamins-Minerals (MULTIVITAMIN ADULT PO) Take 1 capsule by mouth daily.    . Vitamin D, Cholecalciferol, 1000 units CAPS Take 1 capsule by mouth daily. 60 capsule 3   No current facility-administered medications on file prior to visit.     There are no Patient Instructions on file for this visit. No follow-ups on file.   Kris Hartmann, NP  This note was completed  with Sales executive.  Any errors are purely unintentional.

## 2018-06-27 ENCOUNTER — Other Ambulatory Visit: Payer: Self-pay | Admitting: Surgery

## 2018-06-27 ENCOUNTER — Other Ambulatory Visit: Payer: Self-pay

## 2018-06-27 MED ORDER — METOCLOPRAMIDE HCL 10 MG PO TABS: 10.0000 mg | ORAL_TABLET | Freq: Three times a day (TID) | ORAL | 1 refills | Status: DC

## 2018-06-27 NOTE — Telephone Encounter (Signed)
Patient is calling and is asking for a referral on his metoclopramide medication. Please call patient and advise.

## 2018-06-27 NOTE — Telephone Encounter (Signed)
Refill sent to patients pharmacy. 

## 2018-07-02 ENCOUNTER — Telehealth (INDEPENDENT_AMBULATORY_CARE_PROVIDER_SITE_OTHER): Payer: Self-pay

## 2018-07-02 ENCOUNTER — Other Ambulatory Visit (INDEPENDENT_AMBULATORY_CARE_PROVIDER_SITE_OTHER): Payer: Self-pay | Admitting: Nurse Practitioner

## 2018-07-02 ENCOUNTER — Other Ambulatory Visit: Payer: Self-pay

## 2018-07-02 ENCOUNTER — Other Ambulatory Visit
Admission: RE | Admit: 2018-07-02 | Discharge: 2018-07-02 | Disposition: A | Discharge status: Home / Self Care | Payer: Medicare Other | Source: Ambulatory Visit | Attending: Vascular Surgery | Admitting: Vascular Surgery

## 2018-07-02 DIAGNOSIS — Z01812 Encounter for preprocedural laboratory examination: Secondary | ICD-10-CM | POA: Insufficient documentation

## 2018-07-02 DIAGNOSIS — Z1159 Encounter for screening for other viral diseases: Secondary | ICD-10-CM | POA: Insufficient documentation

## 2018-07-02 NOTE — Telephone Encounter (Signed)
Spoke with the patient to schedule him for his leg angio with Dr. Delana Meyer on Wednesday 07/04/2018 with a arrival time of 8:00 am. Patient will do his covid testing today and he is aware of the pre-procedure instructions as well as the zero visitor policy.

## 2018-07-03 LAB — NOVEL CORONAVIRUS, NAA (HOSP ORDER, SEND-OUT TO REF LAB; TAT 18-24 HRS): SARS-CoV-2, NAA: NOT DETECTED

## 2018-07-03 MED ORDER — CLINDAMYCIN PHOSPHATE 300 MG/50ML IV SOLN
300.0000 mg | Freq: Once | INTRAVENOUS | Status: AC
  Administered 2018-07-04: 09:00:00 300 mg via INTRAVENOUS

## 2018-07-04 ENCOUNTER — Ambulatory Visit
Admission: RE | Admit: 2018-07-04 | Discharge: 2018-07-04 | Disposition: A | Discharge status: Home / Self Care | Payer: Medicare Other | Attending: Vascular Surgery | Admitting: Vascular Surgery

## 2018-07-04 ENCOUNTER — Encounter
Admission: RE | Disposition: A | Discharge status: Home / Self Care | Payer: Self-pay | Source: Home / Self Care | Attending: Vascular Surgery

## 2018-07-04 ENCOUNTER — Other Ambulatory Visit: Payer: Self-pay

## 2018-07-04 DIAGNOSIS — M79605 Pain in left leg: Secondary | ICD-10-CM | POA: Diagnosis not present

## 2018-07-04 DIAGNOSIS — I70223 Atherosclerosis of native arteries of extremities with rest pain, bilateral legs: Secondary | ICD-10-CM | POA: Insufficient documentation

## 2018-07-04 DIAGNOSIS — I10 Essential (primary) hypertension: Secondary | ICD-10-CM | POA: Insufficient documentation

## 2018-07-04 DIAGNOSIS — E785 Hyperlipidemia, unspecified: Secondary | ICD-10-CM | POA: Insufficient documentation

## 2018-07-04 DIAGNOSIS — I252 Old myocardial infarction: Secondary | ICD-10-CM | POA: Diagnosis not present

## 2018-07-04 DIAGNOSIS — F17210 Nicotine dependence, cigarettes, uncomplicated: Secondary | ICD-10-CM | POA: Diagnosis not present

## 2018-07-04 DIAGNOSIS — I70222 Atherosclerosis of native arteries of extremities with rest pain, left leg: Secondary | ICD-10-CM | POA: Diagnosis not present

## 2018-07-04 DIAGNOSIS — Z79899 Other long term (current) drug therapy: Secondary | ICD-10-CM | POA: Diagnosis not present

## 2018-07-04 DIAGNOSIS — I70219 Atherosclerosis of native arteries of extremities with intermittent claudication, unspecified extremity: Secondary | ICD-10-CM

## 2018-07-04 HISTORY — PX: LOWER EXTREMITY ANGIOGRAPHY: CATH118251

## 2018-07-04 HISTORY — DX: Malignant neoplasm of prostate: C61

## 2018-07-04 LAB — CREATININE, SERUM
Creatinine, Ser: 0.85 mg/dL (ref 0.61–1.24)
GFR calc Af Amer: >60 mL/min (ref >60)
GFR calc non Af Amer: >60 mL/min (ref >60)

## 2018-07-04 LAB — BUN: BUN: 14 mg/dL (ref 8–23)

## 2018-07-04 SURGERY — LOWER EXTREMITY ANGIOGRAPHY
Anesthesia: Moderate Sedation | Site: Leg Lower | Laterality: Left

## 2018-07-04 MED ORDER — SODIUM CHLORIDE 0.9% FLUSH: 3.0000 mL | Freq: Two times a day (BID) | INTRAVENOUS | Status: DC

## 2018-07-04 MED ORDER — FENTANYL CITRATE (PF) 100 MCG/2ML IJ SOLN
INTRAMUSCULAR | Status: DC | PRN
  Administered 2018-07-04: 50 ug via INTRAVENOUS

## 2018-07-04 MED ORDER — MIDAZOLAM HCL 2 MG/ML PO SYRP: 8.0000 mg | ORAL_SOLUTION | Freq: Once | ORAL | Status: DC | PRN

## 2018-07-04 MED ORDER — MORPHINE SULFATE (PF) 4 MG/ML IV SOLN: 2.0000 mg | INTRAVENOUS | Status: DC | PRN

## 2018-07-04 MED ORDER — ACETAMINOPHEN 325 MG PO TABS: 650.0000 mg | ORAL_TABLET | ORAL | Status: DC | PRN

## 2018-07-04 MED ORDER — FENTANYL CITRATE (PF) 100 MCG/2ML IJ SOLN
INTRAMUSCULAR | Status: AC
  Filled 2018-07-04: qty 2

## 2018-07-04 MED ORDER — OXYCODONE HCL 5 MG PO TABS: 5.0000 mg | ORAL_TABLET | ORAL | Status: DC | PRN

## 2018-07-04 MED ORDER — MIDAZOLAM HCL 5 MG/5ML IJ SOLN
INTRAMUSCULAR | Status: AC
  Filled 2018-07-04: qty 5

## 2018-07-04 MED ORDER — METHYLPREDNISOLONE SODIUM SUCC 125 MG IJ SOLR: 125.0000 mg | Freq: Once | INTRAMUSCULAR | Status: DC | PRN

## 2018-07-04 MED ORDER — SODIUM CHLORIDE 0.9 % IV SOLN: 250.0000 mL | INTRAVENOUS | Status: DC | PRN

## 2018-07-04 MED ORDER — HYDROMORPHONE HCL 1 MG/ML IJ SOLN: 1.0000 mg | Freq: Once | INTRAMUSCULAR | Status: DC | PRN

## 2018-07-04 MED ORDER — DIPHENHYDRAMINE HCL 50 MG/ML IJ SOLN: 50.0000 mg | Freq: Once | INTRAMUSCULAR | Status: DC | PRN

## 2018-07-04 MED ORDER — ATORVASTATIN CALCIUM 10 MG PO TABS: 10.0000 mg | ORAL_TABLET | Freq: Every day | ORAL | Status: DC

## 2018-07-04 MED ORDER — SODIUM CHLORIDE 0.9 % IV SOLN
INTRAVENOUS | Status: DC
  Administered 2018-07-04: 1000 mL via INTRAVENOUS

## 2018-07-04 MED ORDER — HYDRALAZINE HCL 20 MG/ML IJ SOLN: 5.0000 mg | INTRAMUSCULAR | Status: DC | PRN

## 2018-07-04 MED ORDER — HEPARIN SODIUM (PORCINE) 1000 UNIT/ML IJ SOLN
INTRAMUSCULAR | Status: AC
  Filled 2018-07-04: qty 1

## 2018-07-04 MED ORDER — ONDANSETRON HCL 4 MG/2ML IJ SOLN: 4.0000 mg | Freq: Four times a day (QID) | INTRAMUSCULAR | Status: DC | PRN

## 2018-07-04 MED ORDER — IOHEXOL 300 MG/ML  SOLN
INTRAMUSCULAR | Status: DC | PRN
  Administered 2018-07-04: 90 mL via INTRA_ARTERIAL

## 2018-07-04 MED ORDER — FAMOTIDINE 20 MG PO TABS: 40.0000 mg | ORAL_TABLET | Freq: Once | ORAL | Status: DC | PRN

## 2018-07-04 MED ORDER — LIDOCAINE HCL (PF) 1 % IJ SOLN
INTRAMUSCULAR | Status: AC
  Filled 2018-07-04: qty 30

## 2018-07-04 MED ORDER — LABETALOL HCL 5 MG/ML IV SOLN: 10.0000 mg | INTRAVENOUS | Status: DC | PRN

## 2018-07-04 MED ORDER — SODIUM CHLORIDE 0.9 % IV SOLN: INTRAVENOUS | Status: DC

## 2018-07-04 MED ORDER — CLINDAMYCIN PHOSPHATE 300 MG/50ML IV SOLN
INTRAVENOUS | Status: AC
  Filled 2018-07-04: qty 50

## 2018-07-04 MED ORDER — MIDAZOLAM HCL 2 MG/2ML IJ SOLN
INTRAMUSCULAR | Status: DC | PRN
  Administered 2018-07-04: 2 mg via INTRAVENOUS

## 2018-07-04 MED ORDER — SODIUM CHLORIDE 0.9% FLUSH: 3.0000 mL | INTRAVENOUS | Status: DC | PRN

## 2018-07-04 SURGICAL SUPPLY — 11 items
CATH PIG 70CM (CATHETERS) ×3 IMPLANT
COVER EZ STRL 42X30 (DRAPES) ×3 IMPLANT
DEVICE STARCLOSE SE CLOSURE (Vascular Products) ×3 IMPLANT
GLIDEWIRE ADV .035X260CM (WIRE) ×3 IMPLANT
NEEDLE ENTRY 21GA 7CM ECHOTIP (NEEDLE) ×3 IMPLANT
PACK ANGIOGRAPHY (CUSTOM PROCEDURE TRAY) ×3 IMPLANT
SET INTRO CAPELLA COAXIAL (SET/KITS/TRAYS/PACK) ×3 IMPLANT
SHEATH BRITE TIP 5FRX11 (SHEATH) ×3 IMPLANT
SYR MEDRAD MARK 7 150ML (SYRINGE) ×3 IMPLANT
TUBING CONTRAST HIGH PRESS 72 (TUBING) ×3 IMPLANT
WIRE J 3MM .035X145CM (WIRE) ×3 IMPLANT

## 2018-07-04 NOTE — H&P (Signed)
[facility] History & Physical Update  The patient was interviewed and re-examined.  The patient's previous History and Physical has been reviewed and is unchanged.  There is no change in the plan of care. We plan to proceed with the scheduled procedure.  Hortencia Pilar, MD  07/04/2018, 8:33 AM

## 2018-07-04 NOTE — Op Note (Signed)
[facility]  Percutaneous Study/Intervention Procedural Note   Date of Surgery: 07/04/2018,10:03 AM  Surgeon:Schnier, Dolores Lory   Pre-operative Diagnosis: Atherosclerotic occlusive disease bilateral lower extremities with rest pain of the left lower extremity  Post-operative diagnosis:  Same  Procedure(s) Performed:  1.  Abdominal aortogram  2.  Left lower extremity distal runoff third order catheter placement  3.  Star close right common femoral artery    Anesthesia: Conscious sedation was administered by the interventional radiology RN under my direct supervision. IV Versed plus fentanyl were utilized. Continuous ECG, pulse oximetry and blood pressure was monitored throughout the entire procedure.  Conscious sedation was administered for a total of 50 minutes.  Sheath: 5 French 11 cm Pinnacle sheath retrograde right common femoral artery  Contrast: 90 cc   Fluoroscopy Time: 8.1 minutes  Indications:  The patient presents to [facility] with rest pain symptoms of the left foot.  Pedal pulses are nonpalpable bilaterally suggesting atherosclerotic occlusive disease.  The risks and benefits as well as alternative therapies for lower extremity revascularization are reviewed with the patient all questions are answered the patient agrees to proceed.  The patient is therefore undergoing angiography with the hope for intervention for limb salvage.   Procedure:  Cline Draheim a 78 y.o. male who was identified and appropriate procedural time out was performed.  The patient was then placed supine on the table and prepped and draped in the usual sterile fashion.  Ultrasound was used to evaluate the right common femoral artery.  It was echolucent and pulsatile indicating it is patent .  An ultrasound image was acquired for the permanent record.  A micropuncture needle was used to access the right common femoral artery under direct ultrasound guidance.  The microwire  was then advanced under fluoroscopic guidance without difficulty followed by the micro-sheath.  A 0.035 J wire was advanced without resistance and a 5Fr sheath was placed.    Pigtail catheter was then advanced to the level of T12 and AP projection of the aorta was obtained. Pigtail catheter was then repositioned to above the bifurcation and RAO view of the pelvis was obtained. Stiff angled Glidewire and pigtail catheter was then used across the bifurcation and the catheter was positioned in the distal external iliac artery.  LAO of the left groin was then obtained. Wire was reintroduced and negotiated into the SFA and the catheter was advanced into the profunda femoris artery. Distal runoff was then performed.  After review of the images the catheter was removed over wire and an RAO view of the groin was obtained. StarClose device was deployed without difficulty.   Findings:   Aortogram: The abdominal aorta is diffusely diseased with dense calcifications.  In general his arterial tree is easy to identify under plain fluoroscopy given the excessive calcifications throughout the entire arterial system.  There is diffuse disease amongst the common external and internal iliac arteries.  On the right common and external iliac artery appear to be free of hemodynamically significant stenosis.  On the left there is an irregularity at the origin of the left common iliac which may be associated with a large ulceration.  Multiple views were obtained but I am unable to prove whether this is indeed a narrowing associated with an ulcer or just dense calcification of the wall of the common iliac.  The pigtail catheter does cross easily.  The origin of the left internal iliac is a string sign there is diffuse disease distally.  The left external iliac artery is diffusely diseased but there are no hemodynamically significant stenoses.  Left Lower Extremity: The left common femoral is heavily calcified and shows multiple  greater than 80% stenoses.  The origin of the profunda femoris demonstrates a greater than 70% stenosis.  There is an early bifurcation of the profunda and both branches appear to be well collateralized distally.  The SFA is profoundly calcified throughout its entire length and is occluded from the origin down through the popliteal.  A short segment of mid popliteal is reconstituted and there is an occlusion once again.  The trifurcation is heavily diseased with greater than 60% narrowing of the tibioperoneal trunk.  Anterior tibial is patent at its origin but is very slow filling it does appear to be patent to the foot but contributes poorly to the pedal arch.  The peroneal appears to occlude mid part way.  The posterior tibial is occluded at its origin and remains occluded through its proximal one third.  In its midportion and large collateral extending across the knee reconstitutes the posterior tibial and it is then the dominant tibial down to the foot filling the medial and lateral plantars and in fact the pedal arch.  Summary: Given the profound calcifications making intervention a less advantageous choice I believe left common femoral and profunda femoris endarterectomy with femoral to anterior tibial bypass using saphenous vein would be the optimal treatment for this patient's atherosclerotic changes with rest pain.  At the time of surgery pressure gradients could be assessed through the iliacs to verify whether indeed there is an ostial lesion in the common iliac.   Disposition: Patient was taken to the recovery room in stable condition having tolerated the procedure well.  Belenda Cruise Schnier 07/04/2018,10:03 AM

## 2018-07-10 DIAGNOSIS — I2510 Atherosclerotic heart disease of native coronary artery without angina pectoris: Secondary | ICD-10-CM | POA: Diagnosis not present

## 2018-07-10 DIAGNOSIS — I10 Essential (primary) hypertension: Secondary | ICD-10-CM | POA: Diagnosis not present

## 2018-07-10 DIAGNOSIS — I6523 Occlusion and stenosis of bilateral carotid arteries: Secondary | ICD-10-CM | POA: Diagnosis not present

## 2018-07-10 DIAGNOSIS — E782 Mixed hyperlipidemia: Secondary | ICD-10-CM | POA: Diagnosis not present

## 2018-07-10 DIAGNOSIS — I70219 Atherosclerosis of native arteries of extremities with intermittent claudication, unspecified extremity: Secondary | ICD-10-CM | POA: Diagnosis not present

## 2018-07-10 DIAGNOSIS — I214 Non-ST elevation (NSTEMI) myocardial infarction: Secondary | ICD-10-CM | POA: Diagnosis not present

## 2018-07-21 ENCOUNTER — Other Ambulatory Visit: Payer: Self-pay | Admitting: Family Medicine

## 2018-07-21 DIAGNOSIS — I70219 Atherosclerosis of native arteries of extremities with intermittent claudication, unspecified extremity: Secondary | ICD-10-CM

## 2018-07-21 DIAGNOSIS — I2510 Atherosclerotic heart disease of native coronary artery without angina pectoris: Secondary | ICD-10-CM

## 2018-07-26 ENCOUNTER — Other Ambulatory Visit (INDEPENDENT_AMBULATORY_CARE_PROVIDER_SITE_OTHER): Payer: Self-pay | Admitting: Vascular Surgery

## 2018-07-26 DIAGNOSIS — I70222 Atherosclerosis of native arteries of extremities with rest pain, left leg: Secondary | ICD-10-CM

## 2018-07-27 ENCOUNTER — Ambulatory Visit: Payer: Medicare Other | Admitting: Urology

## 2018-07-30 ENCOUNTER — Ambulatory Visit (INDEPENDENT_AMBULATORY_CARE_PROVIDER_SITE_OTHER): Payer: Medicare Other

## 2018-07-30 ENCOUNTER — Other Ambulatory Visit (INDEPENDENT_AMBULATORY_CARE_PROVIDER_SITE_OTHER): Payer: Medicare Other

## 2018-07-30 ENCOUNTER — Other Ambulatory Visit: Payer: Self-pay

## 2018-07-30 ENCOUNTER — Ambulatory Visit (INDEPENDENT_AMBULATORY_CARE_PROVIDER_SITE_OTHER): Payer: Medicare Other | Admitting: Vascular Surgery

## 2018-07-30 ENCOUNTER — Encounter (INDEPENDENT_AMBULATORY_CARE_PROVIDER_SITE_OTHER): Payer: Self-pay | Admitting: Vascular Surgery

## 2018-07-30 VITALS — BP 138/66 | HR 58 | Resp 16 | Ht 67.0 in | Wt 142.0 lb

## 2018-07-30 DIAGNOSIS — I10 Essential (primary) hypertension: Secondary | ICD-10-CM | POA: Diagnosis not present

## 2018-07-30 DIAGNOSIS — F17210 Nicotine dependence, cigarettes, uncomplicated: Secondary | ICD-10-CM

## 2018-07-30 DIAGNOSIS — I2510 Atherosclerotic heart disease of native coronary artery without angina pectoris: Secondary | ICD-10-CM

## 2018-07-30 DIAGNOSIS — I70223 Atherosclerosis of native arteries of extremities with rest pain, bilateral legs: Secondary | ICD-10-CM | POA: Diagnosis not present

## 2018-07-30 DIAGNOSIS — Z955 Presence of coronary angioplasty implant and graft: Secondary | ICD-10-CM | POA: Diagnosis not present

## 2018-07-30 DIAGNOSIS — Z79899 Other long term (current) drug therapy: Secondary | ICD-10-CM | POA: Diagnosis not present

## 2018-07-30 DIAGNOSIS — I6523 Occlusion and stenosis of bilateral carotid arteries: Secondary | ICD-10-CM | POA: Diagnosis not present

## 2018-07-30 DIAGNOSIS — Z7902 Long term (current) use of antithrombotics/antiplatelets: Secondary | ICD-10-CM

## 2018-07-30 DIAGNOSIS — E782 Mixed hyperlipidemia: Secondary | ICD-10-CM | POA: Diagnosis not present

## 2018-07-30 DIAGNOSIS — I70222 Atherosclerosis of native arteries of extremities with rest pain, left leg: Secondary | ICD-10-CM | POA: Diagnosis not present

## 2018-07-30 NOTE — Progress Notes (Signed)
MRN : [id number]  Dylan Villanueva is a 78 y.o. (1940-08-11) male who presents with chief complaint of  Chief Complaint  Patient presents with  . Follow-up    [facility] 2week vein mapping  .  History of Present Illness:   The patient returns to the office for followup and review status post angiogram without intervention. The patient notes his pain in the lower extremity continues. No interval shortening of the patient's claudication distance but he is describing rest pain symptoms. No new ulcers or wounds have occurred since the last visit.  There have been no significant changes to the patient's overall health care.  The patient denies amaurosis fugax or recent TIA symptoms. There are no recent neurological changes noted. The patient denies history of DVT, PE or superficial thrombophlebitis. The patient denies recent episodes of angina or shortness of breath.   Duplex US of the left lower extremity venous system shows inadequate GSV for bypass  Current Meds  Medication Sig  . amLODipine (NORVASC) 10 MG tablet Take 1 tablet (10 mg total) by mouth daily.  Marland Kitchen atorvastatin (LIPITOR) 40 MG tablet Take 1 tablet (40 mg total) by mouth daily.  . cilostazol (PLETAL) 100 MG tablet TAKE 1 TABLET(100 MG) BY MOUTH TWICE DAILY  . clopidogrel (PLAVIX) 75 MG tablet TAKE 1 TABLET(75 MG) BY MOUTH DAILY  . lisinopril (ZESTRIL) 5 MG tablet Take 1 tablet (5 mg total) by mouth daily.  . metoCLOPramide (REGLAN) 10 MG tablet Take 1 tablet (10 mg total) by mouth 3 (three) times daily.  . Multiple Vitamins-Minerals (MULTIVITAMIN ADULT PO) Take 1 capsule by mouth daily.  . Vitamin D, Cholecalciferol, 1000 units CAPS Take 1 capsule by mouth daily.    Past Medical History:  Diagnosis Date  . Cancer [facility]) 2006   prostate   . Colon polyps   . Hypertension   . Myocardial infarction ([place])   . Prostate CA ([place])   . Smoker   . Squamous acanthoma of external ear, right     Past Surgical History:  Procedure  Laterality Date  . APPENDECTOMY  1980  . CARDIAC SURGERY    . COLONOSCOPY WITH PROPOFOL N/A 03/02/2018   Procedure: COLONOSCOPY WITH PROPOFOL;  Surgeon: [name], MD;  Location: [facility];  Service: Endoscopy;  Laterality: N/A;  . CORONARY ANGIOPLASTY WITH STENT PLACEMENT    . ESOPHAGOGASTRODUODENOSCOPY N/A 11/07/2017   Procedure: ESOPHAGOGASTRODUODENOSCOPY (EGD);  Surgeon: Jules Husbands, MD;  Location: [facility];  Service: General;  Laterality: N/A;  . ESOPHAGOGASTRODUODENOSCOPY (EGD) WITH PROPOFOL  03/02/2018   Procedure: ESOPHAGOGASTRODUODENOSCOPY (EGD) WITH PROPOFOL;  Surgeon: [name], MD;  Location: [facility];  Service: Endoscopy;;  . EUS N/A 07/13/2017   Procedure: FULL UPPER ENDOSCOPIC ULTRASOUND (EUS) RADIAL;  Surgeon: Jola Schmidt, MD;  Location: [facility];  Service: Endoscopy;  Laterality: N/A;  . HIGH DOSE RADIATION IMPLANT INSERTION  2006  . LAPAROSCOPIC REMOVAL ABDOMINAL MASS N/A 11/07/2017   Procedure: LAPAROSCOPIC GASTRIC MASS RESECTION;  Surgeon: Jules Husbands, MD;  Location: [facility];  Service: General;  Laterality: N/A;  . LOWER EXTREMITY ANGIOGRAPHY Left 07/04/2018   Procedure: LOWER EXTREMITY ANGIOGRAPHY;  Surgeon: Katha Cabal, MD;  Location: [facility];  Service: Cardiovascular;  Laterality: Left;  . POLYPECTOMY  03/02/2018   Procedure: POLYPECTOMY;  Surgeon: [name], MD;  Location: [facility];  Service: Endoscopy;;    Social History Social History   Tobacco Use  . Smoking status: Current Every Day Smoker  Packs/day: 1.00    Years: 66.00    Pack years: 66.00    Types: Cigarettes  . Smokeless tobacco: Never Used  . Tobacco comment:      Substance Use Topics  . Alcohol use: Yes    Alcohol/week: 3.0 standard drinks    Types: 3 Cans of beer per week  . Drug use: Never    Family History Family History  Problem Relation Age of Onset  . Cancer Mother        pancreatic  . Dementia Father   . Cancer  Sister        breast   . Aneurysm Brother     Allergies  Allergen Reactions  . Penicillins Nausea Only    Has patient had a PCN reaction causing immediate rash, facial/tongue/throat swelling, SOB or lightheadedness with hypotension: no Has patient had a PCN reaction causing severe rash involving mucus membranes or skin necrosis: no Has patient had a PCN reaction that required hospitalization: no Has patient had a PCN reaction occurring within the last 10 years: no If all of the above answers are "NO", then may proceed with Cephalosporin use.      REVIEW OF SYSTEMS (Negative unless checked)  Constitutional: [] Weight loss  [] Fever  [] Chills Cardiac: [] Chest pain   [] Chest pressure   [] Palpitations   [] Shortness of breath when laying flat   [] Shortness of breath with exertion. Vascular:  [x] Pain in legs with walking   [x] Pain in legs at rest  [] History of DVT   [] Phlebitis   [] Swelling in legs   [] Varicose veins   [] Non-healing ulcers Pulmonary:   [] Uses home oxygen   [] Productive cough   [] Hemoptysis   [] Wheeze  [] COPD   [] Asthma Neurologic:  [] Dizziness   [] Seizures   [] History of stroke   [] History of TIA  [] Aphasia   [] Vissual changes   [] Weakness or numbness in arm   [] Weakness or numbness in leg Musculoskeletal:   [] Joint swelling   [x] Joint pain   [] Low back pain Hematologic:  [] Easy bruising  [] Easy bleeding   [] Hypercoagulable state   [] Anemic Gastrointestinal:  [] Diarrhea   [] Vomiting  [] Gastroesophageal reflux/heartburn   [] Difficulty swallowing. Genitourinary:  [] Chronic kidney disease   [] Difficult urination  [] Frequent urination   [] Blood in urine Skin:  [] Rashes   [] Ulcers  Psychological:  [] History of anxiety   []  History of major depression.  Physical Examination  Vitals:   07/30/18 1331  BP: 138/66  Pulse: (!) 58  Resp: 16  Weight: 142 lb (64.4 kg)  Height: 5\' 7"  (1.702 m)   Body mass index is 22.24 kg/m. Gen: WD/WN, NAD Head: [place]/AT, No temporalis wasting.   Ear/Nose/Throat: Hearing grossly intact, nares w/o erythema or drainage Eyes: PER, EOMI, sclera nonicteric.  Neck: Supple, no large masses.   Pulmonary:  Good air movement, no audible wheezing bilaterally, no use of accessory muscles.  Cardiac: RRR, no JVD Vascular:  Vessel Right Left  Radial Palpable Palpable  PT Not Palpable Not Palpable  DP Not Palpable Not Palpable  Gastrointestinal: Non-distended. No guarding/no peritoneal signs.  Musculoskeletal: M/S 5/5 throughout.  No deformity or atrophy.  Neurologic: CN 2-12 intact. Symmetrical.  Speech is fluent. Motor exam as listed above. Psychiatric: Judgment intact, Mood & affect appropriate for pt's clinical situation. Dermatologic: No rashes or ulcers noted.  No changes consistent with cellulitis. Lymph : No lichenification or skin changes of chronic lymphedema.  CBC Lab Results  Component Value Date   WBC 7.3 03/06/2018  HGB 13.8 03/06/2018   HCT 40.8 03/06/2018   MCV 100.7 (H) 03/06/2018   PLT 241 03/06/2018    BMET    Component Value Date/Time   NA 140 03/06/2018 0900   NA 142 05/08/2017 1142   K 4.0 03/06/2018 0900   CL 105 03/06/2018 0900   CO2 28 03/06/2018 0900   GLUCOSE 126 (H) 03/06/2018 0900   BUN 14 07/04/2018 0740   BUN 15 05/08/2017 1142   CREATININE 0.85 07/04/2018 0740   CALCIUM 9.0 03/06/2018 0900   GFRNONAA >60 07/04/2018 0740   GFRAA >60 07/04/2018 0740   CrCl cannot be calculated (Patient's most recent lab result is older than the maximum 21 days allowed.).  COAG Lab Results  Component Value Date   INR 0.98 11/02/2017    Radiology No results found.   Assessment/Plan 1. Atherosclerosis of native artery of both lower extremities with rest pain ([place]) Recommend:  The patient has evidence of severe atherosclerotic changes of both lower extremities with rest pain that is associated with preulcerative changes and impending tissue loss of the left foot.  This represents a limb threatening  ischemia and places the patient at the risk for limb loss.  Patient should undergo angiography of the left lower extremities with the hope for intervention for limb salvage.  The risks and benefits as well as the alternative therapies was discussed in detail with the patient.  All questions were answered.  Patient agrees to proceed with left leg angiography.  Furthermore, I have discussed femoral access as well as pedal access.  I have reviewed alternative therapies including open surgery but given that his best target is his posterior tibial artery and his vein is totally inadequate for bypass we will redirect and go back to intervention as our primary modality.  The patient will follow up with me in the office after the procedure.     A total of 30 minutes was spent with this patient and greater than 50% was spent in counseling and coordination of care with the patient.  Discussion included the treatment options for vascular disease including indications for surgery and intervention.  Also discussed is the appropriate timing of treatment.  In addition medical therapy was discussed.   2. Bilateral carotid artery stenosis Recommend:  Given the patient's asymptomatic subcritical stenosis no further invasive testing or surgery at this time.  Continue antiplatelet therapy as prescribed Continue management of CAD, HTN and Hyperlipidemia Healthy heart diet,  encouraged exercise at least 4 times per week Follow up in 6 months with duplex ultrasound and physical exam   3. Presence of stent in coronary artery in patient with coronary artery disease Continue cardiac and antihypertensive medications as already ordered and reviewed, no changes at this time.  Continue statin as ordered and reviewed, no changes at this time  Nitrates PRN for chest pain   4. Essential hypertension Continue antihypertensive medications as already ordered, these medications have been reviewed and there are no changes  at this time.   5. Hyperlipidemia, mixed Continue statin as ordered and reviewed, no changes at this time     Hortencia Pilar, MD  07/30/2018 2:30 PM

## 2018-07-31 ENCOUNTER — Encounter (INDEPENDENT_AMBULATORY_CARE_PROVIDER_SITE_OTHER): Payer: Self-pay

## 2018-07-31 ENCOUNTER — Telehealth (INDEPENDENT_AMBULATORY_CARE_PROVIDER_SITE_OTHER): Payer: Self-pay

## 2018-07-31 NOTE — Telephone Encounter (Signed)
Spoke with the patient and he is scheduled with Dr. Delana Meyer on 08/10/2018 with a 8:00 am arrival time. Patient will do Covid testing on 08/07/2018 between 10:30-12:30 pm. This information will be mailed out to the patient. Patient wanted to know the restrictions after his procedure. I spoke with Eulogio Ditch NP, per her the patient is not to pick up anything heavier than a gallon of milk, wear his compression hose, bruising and some swelling in the groin area. Patient is not to get in a hot tub, pool or a bathtub. Patient can shower but cover any incision sites. Patient is going to the beach and wants a letter stating he should not take the trip after a procedure.

## 2018-08-02 ENCOUNTER — Encounter (INDEPENDENT_AMBULATORY_CARE_PROVIDER_SITE_OTHER): Payer: Self-pay

## 2018-08-02 NOTE — Telephone Encounter (Signed)
I attempted to contact the patient regarding a letter stating he should not take a trip after his procedure. The letter has been mailed out to the patient's home.

## 2018-08-06 ENCOUNTER — Other Ambulatory Visit (INDEPENDENT_AMBULATORY_CARE_PROVIDER_SITE_OTHER): Payer: Self-pay | Admitting: Nurse Practitioner

## 2018-08-07 ENCOUNTER — Other Ambulatory Visit: Payer: Self-pay

## 2018-08-07 ENCOUNTER — Other Ambulatory Visit
Admission: RE | Admit: 2018-08-07 | Discharge: 2018-08-07 | Disposition: A | Discharge status: Home / Self Care | Payer: Medicare Other | Source: Ambulatory Visit | Attending: Vascular Surgery | Admitting: Vascular Surgery

## 2018-08-07 DIAGNOSIS — Z01812 Encounter for preprocedural laboratory examination: Secondary | ICD-10-CM | POA: Insufficient documentation

## 2018-08-07 DIAGNOSIS — Z1159 Encounter for screening for other viral diseases: Secondary | ICD-10-CM | POA: Insufficient documentation

## 2018-08-07 LAB — CREATININE, SERUM
Creatinine, Ser: 0.81 mg/dL (ref 0.61–1.24)
GFR calc Af Amer: >60 mL/min (ref >60)
GFR calc non Af Amer: >60 mL/min (ref >60)

## 2018-08-07 LAB — BUN: BUN: 14 mg/dL (ref 8–23)

## 2018-08-08 ENCOUNTER — Ambulatory Visit (INDEPENDENT_AMBULATORY_CARE_PROVIDER_SITE_OTHER): Payer: Medicare Other

## 2018-08-08 ENCOUNTER — Other Ambulatory Visit: Payer: Self-pay

## 2018-08-08 VITALS — BP 138/72 | HR 64 | Temp 97.5°F | Resp 16 | Ht 67.0 in | Wt 142.2 lb

## 2018-08-08 DIAGNOSIS — Z0000 Encounter for general adult medical examination without abnormal findings: Secondary | ICD-10-CM | POA: Diagnosis not present

## 2018-08-08 NOTE — Patient Instructions (Signed)
[name] , Thank you for taking time to come for your Medicare Wellness Visit. I appreciate your ongoing commitment to your health goals. Please review the following plan we discussed and let me know if I can assist you in the future.   Screening recommendations/referrals: Colonoscopy: done 03/02/18 Recommended yearly ophthalmology/optometry visit for glaucoma screening and checkup Recommended yearly dental visit for hygiene and checkup  Vaccinations: Influenza vaccine: done 10/15/17 Pneumococcal vaccine: done 05/08/17 Tdap vaccine: done 05/16/17 Shingles vaccine: done 07/26/17    Advanced directives: Please bring a copy of your health care power of attorney and living will to the office at your convenience.  Conditions/risks identified:   Free hearing clinics offered in [place]:   [facility], [place], [place] [address] [phone]  [facility], [place], [place] [phone]   Next appointment: Please follow up in one year for your Medicare Annual Wellness visit.    Preventive Care 80 Years and Older, Male Preventive care refers to lifestyle choices and visits with your health care provider that can promote health and wellness. What does preventive care include?  A yearly physical exam. This is also called an annual well check.  Dental exams once or twice a year.  Routine eye exams. Ask your health care provider how often you should have your eyes checked.  Personal lifestyle choices, including:  Daily care of your teeth and gums.  Regular physical activity.  Eating a healthy diet.  Avoiding tobacco and drug use.  Limiting alcohol use.  Practicing safe sex.  Taking low doses of aspirin every day.  Taking vitamin and mineral supplements as recommended by your health care provider. What happens during an annual well check? The services and screenings done by your health care provider during  your annual well check will depend on your age, overall health, lifestyle risk factors, and family history of disease. Counseling  Your health care provider may ask you questions about your:  Alcohol use.  Tobacco use.  Drug use.  Emotional well-being.  Home and relationship well-being.  Sexual activity.  Eating habits.  History of falls.  Memory and ability to understand (cognition).  Work and work Statistician. Screening  You may have the following tests or measurements:  Height, weight, and BMI.  Blood pressure.  Lipid and cholesterol levels. These may be checked every 5 years, or more frequently if you are over 28 years old.  Skin check.  Lung cancer screening. You may have this screening every year starting at age 26 if you have a 30-pack-year history of smoking and currently smoke or have quit within the past 15 years.  Fecal occult blood test (FOBT) of the stool. You may have this test every year starting at age 20.  Flexible sigmoidoscopy or colonoscopy. You may have a sigmoidoscopy every 5 years or a colonoscopy every 10 years starting at age 59.  Prostate cancer screening. Recommendations will vary depending on your family history and other risks.  Hepatitis C blood test.  Hepatitis B blood test.  Sexually transmitted disease (STD) testing.  Diabetes screening. This is done by checking your blood sugar (glucose) after you have not eaten for a while (fasting). You may have this done every 1-3 years.  Abdominal aortic aneurysm (AAA) screening. You may need this if you are a current or former smoker.  Osteoporosis. You may be screened starting at age 24 if you are at high risk. Talk  with your health care provider about your test results, treatment options, and if necessary, the need for more tests. Vaccines  Your health care provider may recommend certain vaccines, such as:  Influenza vaccine. This is recommended every year.  Tetanus, diphtheria, and  acellular pertussis (Tdap, Td) vaccine. You may need a Td booster every 10 years.  Zoster vaccine. You may need this after age 32.  Pneumococcal 13-valent conjugate (PCV13) vaccine. One dose is recommended after age 14.  Pneumococcal polysaccharide (PPSV23) vaccine. One dose is recommended after age 43. Talk to your health care provider about which screenings and vaccines you need and how often you need them. This information is not intended to replace advice given to you by your health care provider. Make sure you discuss any questions you have with your health care provider. Document Released: 02/27/2015 Document Revised: 10/21/2015 Document Reviewed: 12/02/2014 Elsevier Interactive Patient Education  2017 [place] Prevention in the Home Falls can cause injuries. They can happen to people of all ages. There are many things you can do to make your home safe and to help prevent falls. What can I do on the outside of my home?  Regularly fix the edges of walkways and driveways and fix any cracks.  Remove anything that might make you trip as you walk through a door, such as a raised step or threshold.  Trim any bushes or trees on the path to your home.  Use bright outdoor lighting.  Clear any walking paths of anything that might make someone trip, such as rocks or tools.  Regularly check to see if handrails are loose or broken. Make sure that both sides of any steps have handrails.  Any raised decks and porches should have guardrails on the edges.  Have any leaves, snow, or ice cleared regularly.  Use sand or salt on walking paths during winter.  Clean up any spills in your garage right away. This includes oil or grease spills. What can I do in the bathroom?  Use night lights.  Install grab bars by the toilet and in the tub and shower. Do not use towel bars as grab bars.  Use non-skid mats or decals in the tub or shower.  If you need to sit down in the shower, use  a plastic, non-slip stool.  Keep the floor dry. Clean up any water that spills on the floor as soon as it happens.  Remove soap buildup in the tub or shower regularly.  Attach bath mats securely with double-sided non-slip rug tape.  Do not have throw rugs and other things on the floor that can make you trip. What can I do in the bedroom?  Use night lights.  Make sure that you have a light by your bed that is easy to reach.  Do not use any sheets or blankets that are too big for your bed. They should not hang down onto the floor.  Have a firm chair that has side arms. You can use this for support while you get dressed.  Do not have throw rugs and other things on the floor that can make you trip. What can I do in the kitchen?  Clean up any spills right away.  Avoid walking on wet floors.  Keep items that you use a lot in easy-to-reach places.  If you need to reach something above you, use a strong step stool that has a grab bar.  Keep electrical cords out of the way.  Do  not use floor polish or wax that makes floors slippery. If you must use wax, use non-skid floor wax.  Do not have throw rugs and other things on the floor that can make you trip. What can I do with my stairs?  Do not leave any items on the stairs.  Make sure that there are handrails on both sides of the stairs and use them. Fix handrails that are broken or loose. Make sure that handrails are as long as the stairways.  Check any carpeting to make sure that it is firmly attached to the stairs. Fix any carpet that is loose or worn.  Avoid having throw rugs at the top or bottom of the stairs. If you do have throw rugs, attach them to the floor with carpet tape.  Make sure that you have a light switch at the top of the stairs and the bottom of the stairs. If you do not have them, ask someone to add them for you. What else can I do to help prevent falls?  Wear shoes that:  Do not have high heels.  Have  rubber bottoms.  Are comfortable and fit you well.  Are closed at the toe. Do not wear sandals.  If you use a stepladder:  Make sure that it is fully opened. Do not climb a closed stepladder.  Make sure that both sides of the stepladder are locked into place.  Ask someone to hold it for you, if possible.  Clearly mark and make sure that you can see:  Any grab bars or handrails.  First and last steps.  Where the edge of each step is.  Use tools that help you move around (mobility aids) if they are needed. These include:  Canes.  Walkers.  Scooters.  Crutches.  Turn on the lights when you go into a dark area. Replace any light bulbs as soon as they burn out.  Set up your furniture so you have a clear path. Avoid moving your furniture around.  If any of your floors are uneven, fix them.  If there are any pets around you, be aware of where they are.  Review your medicines with your doctor. Some medicines can make you feel dizzy. This can increase your chance of falling. Ask your doctor what other things that you can do to help prevent falls. This information is not intended to replace advice given to you by your health care provider. Make sure you discuss any questions you have with your health care provider. Document Released: 11/27/2008 Document Revised: 07/09/2015 Document Reviewed: 03/07/2014 Elsevier Interactive Patient Education  2017 [employer].

## 2018-08-08 NOTE — Progress Notes (Signed)
Subjective:   Dylan Villanueva is a 78 y.o. male who presents for Medicare Annual/Subsequent preventive examination.  Review of Systems:   Cardiac Risk Factors include: advanced age (>37men, >61 women);smoking/ tobacco exposure;hypertension;dyslipidemia     Objective:    Vitals: BP 138/72 (BP Location: Left Arm, Patient Position: Sitting, Cuff Size: Normal)   Pulse 64   Temp (!) 97.5 F (36.4 C) (Oral)   Resp 16   Ht 5\' 7"  (1.702 m)   Wt 142 lb 3.2 oz (64.5 kg)   SpO2 98%   BMI 22.27 kg/m   Body mass index is 22.27 kg/m.  Advanced Directives 08/08/2018 07/04/2018 03/02/2018 11/07/2017 11/07/2017 07/13/2017 06/01/2017  Does Patient Have a Medical Advance Directive? Yes Yes Yes Yes Yes Yes Yes  Type of Paramedic of [place];Living will Living will;Healthcare Power of [place];Living will [place];Living will  Does patient want to make changes to medical advance directive? - No - Patient declined No - Patient declined No - Patient declined - - -  Copy of [place] in Chart? No - copy requested No - copy requested No - copy requested - - - No - copy requested    Tobacco Social History   Tobacco Use  Smoking Status Current Every Day Smoker  . Packs/day: 1.00  . Years: 66.00  . Pack years: 66.00  . Types: Cigarettes  Smokeless Tobacco Never Used  Tobacco Comment           Ready to quit: Not Answered Counseling given: Not Answered Comment:       Clinical Intake:  Pre-visit preparation completed: Yes  Pain : No/denies pain     BMI - recorded: 22.27 Nutritional Status: BMI of 19-24  Normal Nutritional Risks: None Diabetes: No  How often do you need to have someone help you when you read instructions, pamphlets, or other written materials from your doctor or pharmacy?: 1 - Never  Interpreter Needed?: No  Information entered by :: Clemetine Marker LPN   Past Medical History:  Diagnosis Date  . Cancer [facility]) 2006   prostate   . Colon polyps   . Hyperlipidemia   . Hypertension   . Myocardial infarction ([place])   . Prostate CA ([place])   . Smoker   . Squamous acanthoma of external ear, right    Past Surgical History:  Procedure Laterality Date  . APPENDECTOMY  1980  . CARDIAC SURGERY    . COLONOSCOPY WITH PROPOFOL N/A 03/02/2018   Procedure: COLONOSCOPY WITH PROPOFOL;  Surgeon: [name], MD;  Location: [facility];  Service: Endoscopy;  Laterality: N/A;  . CORONARY ANGIOPLASTY WITH STENT PLACEMENT    . ESOPHAGOGASTRODUODENOSCOPY N/A 11/07/2017   Procedure: ESOPHAGOGASTRODUODENOSCOPY (EGD);  Surgeon: Jules Husbands, MD;  Location: [facility];  Service: General;  Laterality: N/A;  . ESOPHAGOGASTRODUODENOSCOPY (EGD) WITH PROPOFOL  03/02/2018   Procedure: ESOPHAGOGASTRODUODENOSCOPY (EGD) WITH PROPOFOL;  Surgeon: [name], MD;  Location: [facility];  Service: Endoscopy;;  . EUS N/A 07/13/2017   Procedure: FULL UPPER ENDOSCOPIC ULTRASOUND (EUS) RADIAL;  Surgeon: Jola Schmidt, MD;  Location: [facility];  Service: Endoscopy;  Laterality: N/A;  . HIGH DOSE RADIATION IMPLANT INSERTION  2006  . LAPAROSCOPIC REMOVAL ABDOMINAL MASS N/A 11/07/2017   Procedure: LAPAROSCOPIC GASTRIC MASS RESECTION;  Surgeon: Jules Husbands, MD;  Location: [facility];  Service: General;  Laterality: N/A;  . LOWER EXTREMITY ANGIOGRAPHY Left 07/04/2018  Procedure: LOWER EXTREMITY ANGIOGRAPHY;  Surgeon: Katha Cabal, MD;  Location: [facility];  Service: Cardiovascular;  Laterality: Left;  . POLYPECTOMY  03/02/2018   Procedure: POLYPECTOMY;  Surgeon: [name], MD;  Location: [facility];  Service: Endoscopy;;   Family History  Problem Relation Age of Onset  . Cancer Mother        pancreatic  . Dementia Father   . Cancer Sister        breast   . Aneurysm Brother    Social History   Socioeconomic History  . Marital  status: Married    Spouse name: Jana Half  . Number of children: 2  . Years of education: Not on file  . Highest education level: Bachelor's degree (e.g., BA, AB, BS)  Occupational History  . Occupation: retired   Scientific laboratory technician  . Financial resource strain: Not hard at all  . Food insecurity    Worry: Never true    Inability: Never true  . Transportation needs    Medical: No    Non-medical: No  Tobacco Use  . Smoking status: Current Every Day Smoker    Packs/day: 1.00    Years: 66.00    Pack years: 66.00    Types: Cigarettes  . Smokeless tobacco: Never Used  . Tobacco comment:      Substance and Sexual Activity  . Alcohol use: Yes    Alcohol/week: 3.0 standard drinks    Types: 3 Cans of beer per week  . Drug use: Never  . Sexual activity: Not Currently  Lifestyle  . Physical activity    Days per week: 0 days    Minutes per session: 0 min  . Stress: Not at all  Relationships  . Social connections    Talks on phone: More than three times a week    Gets together: Three times a week    Attends religious service: Never    Active member of club or organization: No    Attends meetings of clubs or organizations: Never    Relationship status: Married  Other Topics Concern  . Not on file  Social History Narrative  . Not on file    Outpatient Encounter Medications as of 08/08/2018  Medication Sig  . amLODipine (NORVASC) 10 MG tablet Take 1 tablet (10 mg total) by mouth daily.  Marland Kitchen atorvastatin (LIPITOR) 40 MG tablet Take 1 tablet (40 mg total) by mouth daily.  . cilostazol (PLETAL) 100 MG tablet TAKE 1 TABLET(100 MG) BY MOUTH TWICE DAILY  . clopidogrel (PLAVIX) 75 MG tablet TAKE 1 TABLET(75 MG) BY MOUTH DAILY (Patient taking differently: Take 75 mg by mouth daily. TAKE 1 TABLET(75 MG) BY MOUTH DAILY)  . lisinopril (ZESTRIL) 5 MG tablet Take 1 tablet (5 mg total) by mouth daily.  . metoCLOPramide (REGLAN) 10 MG tablet Take 1 tablet (10 mg total) by mouth 3 (three) times daily.  .  Multiple Vitamins-Minerals (MULTIVITAMIN ADULT PO) Take 1 tablet by mouth daily.   . Vitamin D, Cholecalciferol, 1000 units CAPS Take 1 capsule by mouth daily.   No facility-administered encounter medications on file as of 08/08/2018.     Activities of Daily Living In your present state of health, do you have any difficulty performing the following activities: 08/08/2018 07/04/2018  Hearing? N N  Comment declines hearing aids -  Vision? N N  Difficulty concentrating or making decisions? N N  Walking or climbing stairs? N N  Dressing or bathing? N N  Doing  errands, shopping? N -  Preparing Food and eating ? N -  Using the Toilet? N -  In the past six months, have you accidently leaked urine? Y -  Comment wears pads for protection -  Do you have problems with loss of bowel control? N -  Managing your Medications? N -  Managing your Finances? N -  Housekeeping or managing your Housekeeping? N -  Some recent data might be hidden    Patient Care Team: Juline Patch, MD as PCP - General (Family Medicine) Corey Skains, MD as Consulting Physician (Cardiology) Hollice Espy, MD as Consulting Physician (Urology)   Assessment:   This is a routine wellness examination for Koichi.  Exercise Activities and Dietary recommendations Current Exercise Habits: The patient does not participate in regular exercise at present, Exercise limited by: Other - see comments(vascular condition)  Goals    . DIET - INCREASE WATER INTAKE     Recommend to drink at least 6-8 8oz glasses of water per day.       Fall Risk Fall Risk  08/08/2018 03/28/2018 02/28/2018 06/01/2017 05/08/2017  Falls in the past year? 0 0 0 No No  Number falls in past yr: 0 - 0 - -  Injury with Fall? 0 - 0 - -  Risk for fall due to : - - - Impaired vision -  Risk for fall due to: Comment - - - c/o changes with his vision -  Follow up Falls prevention discussed Falls evaluation completed - - -   FALL RISK PREVENTION  PERTAINING TO THE HOME:  Any stairs in or around the home? Yes  If so, do they handrails? Yes   Home free of loose throw rugs in walkways, pet beds, electrical cords, etc? Yes  Adequate lighting in your home to reduce risk of falls? Yes   ASSISTIVE DEVICES UTILIZED TO PREVENT FALLS:  Life alert? No  Use of a cane, walker or w/c? No  Grab bars in the bathroom? Yes  Shower chair or bench in shower? No  Elevated toilet seat or a handicapped toilet? Yes   DME ORDERS:  DME order needed?  No   TIMED UP AND GO:  Was the test performed? Yes .  Length of time to ambulate 10 feet: 6 sec.   GAIT:  Appearance of gait: Gait stead-fast and without the use of an assistive device.    Education: Fall risk prevention has been discussed.  Intervention(s) required? No    Depression Screen PHQ 2/9 Scores 08/08/2018 06/08/2018 06/01/2017 05/08/2017  PHQ - 2 Score 0 0 0 0  PHQ- 9 Score 0 0 0 -    Cognitive Function     6CIT Screen 08/08/2018 06/01/2017  What Year? 0 points 0 points  What month? 0 points 0 points  What time? 0 points 0 points  Count back from 20 0 points 0 points  Months in reverse 0 points 0 points  Repeat phrase 0 points 0 points  Total Score 0 0    Immunization History  Administered Date(s) Administered  . Influenza, High Dose Seasonal PF 01/20/2017, 12/13/2017  . Pneumococcal Conjugate-13 05/08/2017  . Tdap 05/16/2017  . Zoster Recombinat (Shingrix) 05/16/2017, 07/26/2017    Qualifies for Shingles Vaccine? Yes  Shingrix series completed.   Tdap: Up to date  Flu Vaccine: Up to date  Pneumococcal Vaccine: Up to date. PPSV23 completed per patient, no record.    Screening Tests Health Maintenance  Topic Date Due  .  PNA vac Low Risk Adult (2 of 2 - PPSV23) 02/15/2019 (Originally 05/09/2018)  . INFLUENZA VACCINE  09/15/2018  . TETANUS/TDAP  05/17/2027   Cancer Screenings:  Colorectal Screening: Completed 03/02/18.  No longer required.  Lung Cancer  Screening: (Low Dose CT Chest recommended if Age 85-80 years, 30 pack-year currently smoking OR have quit w/in 15years.) does qualify. Pt declines lung cancer screening.   Additional Screening:  Hepatitis C Screening: no longer required  Vision Screening: Recommended annual ophthalmology exams for early detection of glaucoma and other disorders of the eye. Is the patient up to date with their annual eye exam?  No  Who is the provider or what is the name of the office in which the pt attends annual eye exams? Not established  Dental Screening: Recommended annual dental exams for proper oral hygiene  Community Resource Referral:  CRR required this visit?  No       Plan:    I have personally reviewed and addressed the Medicare Annual Wellness questionnaire and have noted the following in the patient's chart:  A. Medical and social history B. Use of alcohol, tobacco or illicit drugs  C. Current medications and supplements D. Functional ability and status E.  Nutritional status F.  Physical activity G. Advance directives H. List of other physicians I.  Hospitalizations, surgeries, and ER visits in previous 12 months J.  [place] such as hearing and vision if needed, cognitive and depression L. Referrals and appointments   In addition, I have reviewed and discussed with patient certain preventive protocols, quality metrics, and best practice recommendations. A written personalized care plan for preventive services as well as general preventive health recommendations were provided to patient.   Signed,  Clemetine Marker, LPN Nurse Health Advisor   Nurse Notes: pt doing well. Scheduled for vascular surgery on Friday 08/10/18 for left leg

## 2018-08-09 LAB — NOVEL CORONAVIRUS, NAA (HOSP ORDER, SEND-OUT TO REF LAB; TAT 18-24 HRS): SARS-CoV-2, NAA: NOT DETECTED

## 2018-08-09 MED ORDER — CLINDAMYCIN PHOSPHATE 300 MG/50ML IV SOLN: 300.0000 mg | Freq: Once | INTRAVENOUS | Status: DC

## 2018-08-10 ENCOUNTER — Other Ambulatory Visit: Payer: Self-pay

## 2018-08-10 ENCOUNTER — Encounter
Admission: RE | Disposition: A | Discharge status: Home / Self Care | Payer: Self-pay | Source: Home / Self Care | Attending: Vascular Surgery

## 2018-08-10 ENCOUNTER — Ambulatory Visit
Admission: RE | Admit: 2018-08-10 | Discharge: 2018-08-10 | Disposition: A | Discharge status: Home / Self Care | Payer: Medicare Other | Attending: Vascular Surgery | Admitting: Vascular Surgery

## 2018-08-10 DIAGNOSIS — I70223 Atherosclerosis of native arteries of extremities with rest pain, bilateral legs: Secondary | ICD-10-CM | POA: Diagnosis not present

## 2018-08-10 DIAGNOSIS — I6523 Occlusion and stenosis of bilateral carotid arteries: Secondary | ICD-10-CM | POA: Insufficient documentation

## 2018-08-10 DIAGNOSIS — I70229 Atherosclerosis of native arteries of extremities with rest pain, unspecified extremity: Secondary | ICD-10-CM

## 2018-08-10 DIAGNOSIS — Z955 Presence of coronary angioplasty implant and graft: Secondary | ICD-10-CM | POA: Insufficient documentation

## 2018-08-10 DIAGNOSIS — Z8546 Personal history of malignant neoplasm of prostate: Secondary | ICD-10-CM | POA: Diagnosis not present

## 2018-08-10 DIAGNOSIS — I10 Essential (primary) hypertension: Secondary | ICD-10-CM | POA: Diagnosis not present

## 2018-08-10 DIAGNOSIS — I252 Old myocardial infarction: Secondary | ICD-10-CM | POA: Insufficient documentation

## 2018-08-10 DIAGNOSIS — F17210 Nicotine dependence, cigarettes, uncomplicated: Secondary | ICD-10-CM | POA: Diagnosis not present

## 2018-08-10 DIAGNOSIS — I70222 Atherosclerosis of native arteries of extremities with rest pain, left leg: Secondary | ICD-10-CM | POA: Diagnosis not present

## 2018-08-10 DIAGNOSIS — I2510 Atherosclerotic heart disease of native coronary artery without angina pectoris: Secondary | ICD-10-CM | POA: Insufficient documentation

## 2018-08-10 DIAGNOSIS — E785 Hyperlipidemia, unspecified: Secondary | ICD-10-CM | POA: Diagnosis not present

## 2018-08-10 HISTORY — PX: LOWER EXTREMITY ANGIOGRAPHY: CATH118251

## 2018-08-10 SURGERY — LOWER EXTREMITY ANGIOGRAPHY
Anesthesia: Moderate Sedation | Site: Leg Lower | Laterality: Left

## 2018-08-10 MED ORDER — SODIUM CHLORIDE 0.9 % IV SOLN: 250.0000 mL | INTRAVENOUS | Status: DC | PRN

## 2018-08-10 MED ORDER — DIPHENHYDRAMINE HCL 50 MG/ML IJ SOLN: 50.0000 mg | Freq: Once | INTRAMUSCULAR | Status: DC | PRN

## 2018-08-10 MED ORDER — ACETAMINOPHEN 325 MG PO TABS: 650.0000 mg | ORAL_TABLET | ORAL | Status: DC | PRN

## 2018-08-10 MED ORDER — MIDAZOLAM HCL 2 MG/ML PO SYRP: 8.0000 mg | ORAL_SOLUTION | Freq: Once | ORAL | Status: DC | PRN

## 2018-08-10 MED ORDER — FENTANYL CITRATE (PF) 100 MCG/2ML IJ SOLN
INTRAMUSCULAR | Status: AC
  Filled 2018-08-10: qty 2

## 2018-08-10 MED ORDER — SODIUM CHLORIDE 0.9 % IV BOLUS
500.0000 mL | Freq: Once | INTRAVENOUS | Status: AC
  Administered 2018-08-10: 500 mL via INTRAVENOUS

## 2018-08-10 MED ORDER — FAMOTIDINE 20 MG PO TABS: 40.0000 mg | ORAL_TABLET | Freq: Once | ORAL | Status: DC | PRN

## 2018-08-10 MED ORDER — HEPARIN SODIUM (PORCINE) 1000 UNIT/ML IJ SOLN
INTRAMUSCULAR | Status: AC
  Filled 2018-08-10: qty 1

## 2018-08-10 MED ORDER — OXYCODONE HCL 5 MG PO TABS: 5.0000 mg | ORAL_TABLET | ORAL | Status: DC | PRN

## 2018-08-10 MED ORDER — MIDAZOLAM HCL 5 MG/5ML IJ SOLN
INTRAMUSCULAR | Status: AC
  Filled 2018-08-10: qty 5

## 2018-08-10 MED ORDER — METHYLPREDNISOLONE SODIUM SUCC 125 MG IJ SOLR: 125.0000 mg | Freq: Once | INTRAMUSCULAR | Status: DC | PRN

## 2018-08-10 MED ORDER — SODIUM CHLORIDE 0.9 % IV SOLN
INTRAVENOUS | Status: DC
  Administered 2018-08-10: 09:00:00 via INTRAVENOUS

## 2018-08-10 MED ORDER — IODIXANOL 320 MG/ML IV SOLN
INTRAVENOUS | Status: DC | PRN
  Administered 2018-08-10: 115 mL via INTRA_ARTERIAL

## 2018-08-10 MED ORDER — SODIUM CHLORIDE 0.9% FLUSH: 3.0000 mL | INTRAVENOUS | Status: DC | PRN

## 2018-08-10 MED ORDER — HYDRALAZINE HCL 20 MG/ML IJ SOLN: 5.0000 mg | INTRAMUSCULAR | Status: DC | PRN

## 2018-08-10 MED ORDER — HEPARIN SODIUM (PORCINE) 1000 UNIT/ML IJ SOLN
INTRAMUSCULAR | Status: DC | PRN
  Administered 2018-08-10: 5000 [IU] via INTRAVENOUS

## 2018-08-10 MED ORDER — CLINDAMYCIN PHOSPHATE 300 MG/50ML IV SOLN
INTRAVENOUS | Status: AC
  Administered 2018-08-10: 10:00:00
  Filled 2018-08-10: qty 50

## 2018-08-10 MED ORDER — FENTANYL CITRATE (PF) 100 MCG/2ML IJ SOLN
INTRAMUSCULAR | Status: DC | PRN
  Administered 2018-08-10: 50 ug via INTRAVENOUS

## 2018-08-10 MED ORDER — ONDANSETRON HCL 4 MG/2ML IJ SOLN: 4.0000 mg | Freq: Four times a day (QID) | INTRAMUSCULAR | Status: DC | PRN

## 2018-08-10 MED ORDER — ASPIRIN EC 325 MG PO TBEC
DELAYED_RELEASE_TABLET | ORAL | Status: AC
  Filled 2018-08-10: qty 1

## 2018-08-10 MED ORDER — MORPHINE SULFATE (PF) 4 MG/ML IV SOLN: 2.0000 mg | INTRAVENOUS | Status: DC | PRN

## 2018-08-10 MED ORDER — ASPIRIN 81 MG PO CHEW
324.0000 mg | CHEWABLE_TABLET | Freq: Once | ORAL | Status: AC
  Administered 2018-08-10: 324 mg via ORAL

## 2018-08-10 MED ORDER — HYDROMORPHONE HCL 1 MG/ML IJ SOLN: 1.0000 mg | Freq: Once | INTRAMUSCULAR | Status: DC | PRN

## 2018-08-10 MED ORDER — LABETALOL HCL 5 MG/ML IV SOLN: 10.0000 mg | INTRAVENOUS | Status: DC | PRN

## 2018-08-10 MED ORDER — MIDAZOLAM HCL 2 MG/2ML IJ SOLN
INTRAMUSCULAR | Status: DC | PRN
  Administered 2018-08-10: 2 mg via INTRAVENOUS

## 2018-08-10 MED ORDER — LIDOCAINE HCL (PF) 1 % IJ SOLN
INTRAMUSCULAR | Status: AC
  Filled 2018-08-10: qty 30

## 2018-08-10 MED ORDER — SODIUM CHLORIDE 0.9% FLUSH: 3.0000 mL | Freq: Two times a day (BID) | INTRAVENOUS | Status: DC

## 2018-08-10 MED ORDER — SODIUM CHLORIDE 0.9 % IV SOLN: INTRAVENOUS | Status: DC

## 2018-08-10 SURGICAL SUPPLY — 33 items
BALLN LUTONIX AV 6X60X75 (BALLOONS) ×3
BALLN LUTONIX AV 7X60X75 (BALLOONS) ×6
BALLN LUTONIX DCB 7X40X130 (BALLOONS) ×3
BALLN ULTRASCORE 6X40X130 (BALLOONS) ×3
BALLOON LUTONIX AV 6X60X75 (BALLOONS) IMPLANT
BALLOON LUTONIX AV 7X60X75 (BALLOONS) IMPLANT
BALLOON LUTONIX DCB 7X40X130 (BALLOONS) IMPLANT
BALLOON ULTRASCORE 6X40X130 (BALLOONS) IMPLANT
CATH BEACON 5 .035 65 KMP TIP (CATHETERS) ×2 IMPLANT
CATH BEACON 5 .035 65 RIM TIP (CATHETERS) ×2 IMPLANT
CATH CROSSER 14S OTW 106CM (CATHETERS) ×2 IMPLANT
CATH PIG 70CM (CATHETERS) ×2 IMPLANT
CATH SEEKER .035X90 (CATHETERS) ×2 IMPLANT
CATH SKICK ANG 70CM (CATHETERS) ×2 IMPLANT
COVER PROBE U/S 5X48 (MISCELLANEOUS) ×2 IMPLANT
DEVICE PRESTO INFLATION (MISCELLANEOUS) ×2 IMPLANT
DEVICE SAFEGUARD 24CM (GAUZE/BANDAGES/DRESSINGS) ×2 IMPLANT
DEVICE STARCLOSE SE CLOSURE (Vascular Products) ×2 IMPLANT
DEVICE TORQUE .025-.038 (MISCELLANEOUS) ×2 IMPLANT
DRAPE INCISE IOBAN 66X45 STRL (DRAPES) ×2 IMPLANT
GLIDEWIRE STIFF .35X180X3 HYDR (WIRE) ×4 IMPLANT
GUIDEWIRE SUPER STIFF .035X180 (WIRE) ×2 IMPLANT
NDL ENTRY 21GA 7CM ECHOTIP (NEEDLE) IMPLANT
NEEDLE ENTRY 21GA 7CM ECHOTIP (NEEDLE) ×3 IMPLANT
PACK ANGIOGRAPHY (CUSTOM PROCEDURE TRAY) ×3 IMPLANT
SET INTRO CAPELLA COAXIAL (SET/KITS/TRAYS/PACK) ×2 IMPLANT
SHEATH BRITE TIP 5FRX11 (SHEATH) ×2 IMPLANT
SHEATH FLEXOR ANSEL2 7FRX45 (SHEATH) ×2 IMPLANT
SHEATH PINNACLE MP 7F 45CM (SHEATH) ×2 IMPLANT
STENT LIFESTAR 8X40 (Permanent Stent) ×2 IMPLANT
SYR MEDRAD MARK 7 150ML (SYRINGE) ×2 IMPLANT
TUBING CONTRAST HIGH PRESS 72 (TUBING) ×2 IMPLANT
WIRE J 3MM .035X145CM (WIRE) ×2 IMPLANT

## 2018-08-10 NOTE — H&P (Signed)
[facility] History & Physical Update  The patient was interviewed and re-examined.  The patient's previous History and Physical has been reviewed and is unchanged.  There is no change in the plan of care. We plan to proceed with the scheduled procedure.  Hortencia Pilar, MD  08/10/2018, 10:05 AM

## 2018-08-10 NOTE — Progress Notes (Signed)
Dr. Delana Meyer at bedside speaking with pt. Re: procedural results. Pt. Verbalized understanding. Pt. Med. With ASA 325 mg p.o. per MD orders now.

## 2018-08-10 NOTE — Op Note (Signed)
[facility] Percutaneous Study/Intervention Procedural Note   Date of Surgery: 08/10/2018  Surgeon:  Katha Cabal, MD.  Pre-operative Diagnosis: Atherosclerotic occlusive disease bilateral lower extremities with lifestyle limiting claudication and mild rest pain of the left lower extremity  Post-operative diagnosis: Same  Procedure(s) Performed: 1. Introduction catheter into left lower extremity 3rd order catheter placement  2. Contrast injection left lower extremity for distal runoff   3. Percutaneous transluminal angioplasty and stent placement left external iliac artery  4.  Percutaneous transluminal angioplasty left profunda femoris artery             5.  Attempted Crosser atherectomy left superficial femoral artery unsuccessful             6.  Star close closure right common femoral arteriotomy  Anesthesia: Conscious sedation was administered under my direct supervision by the interventional radiology RN. IV Versed plus fentanyl were utilized. Continuous ECG, pulse oximetry and blood pressure was monitored throughout the entire procedure.  Conscious sedation was for a total of 138 minutes.  Sheath: 7 French destination right common femoral retrograde  Contrast: 115 cc  Fluoroscopy Time: 28.9 minutes  Indications: Inioluwa Baris presents with worsening pain in his left lower extremity.  He describes lifestyle limiting claudication in association with mild rest pain symptoms.  Noninvasive studies as well as physical examination demonstrate severe atherosclerotic occlusive disease of the left lower extremity.  Recent diagnostic angiogram was performed and therapeutic options were discussed with the patient.  He is elected to attempt intervention hoping to avoid traditional open surgery.  The risks and benefits are reviewed all questions answered patient agrees to proceed.  Procedure: Joandy Burget is a  78 y.o. y.o. male who was identified and appropriate procedural time out was performed. The patient was then placed supine on the table and prepped and draped in the usual sterile fashion.   Ultrasound was placed in the sterile sleeve and the right groin was evaluated the right common femoral artery was echolucent and pulsatile indicating patency.  Image was recorded for the permanent record and under real-time visualization a microneedle was inserted into the common femoral artery microwire followed by a micro-sheath.  A J-wire was then advanced through the micro-sheath and a  5 [place] sheath was then inserted over a J-wire.  A stiff angle Glidewire with a rim catheter was used to cross the bifurcation and a RAO view of the pelvis was obtained.  The catheter wire were advanced down into the left distal external iliac artery. Oblique view of the femoral bifurcation was then obtained.  5000 units of heparin was given and allowed to circulate for several minutes.  Attempts at crossing the aortic bifurcation with a 7 [place] Ansell sheath were unsuccessful, the sheath would not advance past the origin of the external iliac on the left.  By using several different obliques both right and left as well as an AP I was able to identify a 80% stenosis at the origin of the left external iliac artery.  I treated this with a 6 mm Lutonix balloon inflation.  The sheath was still not advance and I used a 7 mm Lutonix balloon to treat this lesion.  I still encountered problems with the Ansell sheath and therefore a 7 [place] destination sheath was advanced up and over the bifurcation and down into the distal external iliac this time without difficulty.  KMP  catheter and stiff angle Glidewire were then negotiated down into the distal common  femoral.  I was able to engage the SFA and advanced the Kumpe catheter several centimeters into the SFA.  This was identified under fluoroscopy secondary to the dense calcifications.   The 25 S crosser atherectomy catheter was then prepped on the field a side kick catheter was advanced over the Glidewire after the Kumpe was removed and the 14 S crosser placed within the proximal SFA.  Attempts at crossing down to the above-knee popliteal with the 14 S as well as with glide catheter Kumpe catheter seeker catheter and the Glidewire were not successful.  I therefore decided to treat the deep femoral lesion.  The origin of the profunda femoris demonstrates a 80 to 90% stenosis.  Distally the profunda femoris is widely patent with 2 large branches and extensive collaterals reconstituting the above-knee popliteal.  The Glidewire was then negotiated into the deep femoral followed by a catheter and then an Amplatz wire was exchanged for the Glidewire and the catheter removed.  A 6 mm x 40 mm ultra score balloon was then advanced across the ostia of the profunda and inflated to 12 atm for 1 full minute.  Next a 7 mm x 60 mm Lutonix drug-eluting balloon was advanced across the same lesion inflation was to 12 atm for 1 full minute and then to 14 atm for another 30 seconds.  Follow-up imaging demonstrated less than 10% residual stenosis with a marked improvement in flow into the profunda femoris.  I then returned to the origin of the external iliac pulling the sheath back into the mid common iliac.  Magnified imaging demonstrated that there was persistent greater than 50% stenosis from the previously treated lesion and therefore I elected to stent this lesion.  An 8 mm x 40 mm life star stent was then deployed across the lesion and postdilated with a 7 mm x 40 mm Lutonix drug-eluting balloon inflated to 12 atm for 1 full minute.  Follow-up imaging demonstrated less than 10% residual stenosis.  Having treated to inflow lesions I believe the he will have sufficient improvement that it may alleviate his rest pain.  If not I will discuss potential endarterectomy and superficial femoral artery stenting with  him in the office as a hybrid procedure.  Findings:  The distal aorta remains widely patent although heavily diseased.  The left common iliac demonstrates diffuse calcifications but is patent.  I ultimately in several different views determine there was an 80% stenosis at the ostia of the external iliac.  The ostia of the internal iliac on the left demonstrates a string sign.  The distal external iliac appears patent without hemodynamically significant stenosis.  The common femoral demonstrates moderate disease in the 30 to 40% range.  SFA is occluded at its origin.  Above-knee popliteal is reconstituted at Hunter's canal.  The profunda femoris demonstrates a 80-90% at its origin as noted above it has 2 very large branches and extensively collateralizes to reconstitute the above-knee popliteal.  Attempts at crosser atherectomy for the recanalization of the SFA are unsuccessful.  Angioplasty of the ostial lesion of the profunda femoris is successful with less than 10% residual stenosis.  Angioplasty and stent placement of the ostial lesion of the external iliac artery is successful with less than 10% residual stenosis.    Summary: Successful recanalization left external iliac and left profunda femoris arteries of the lower extremity for limb salvage    Disposition: Patient was taken to the recovery room in stable condition having tolerated the procedure well.  Schnier, Dolores Lory 08/10/2018,12:20 PM

## 2018-08-21 ENCOUNTER — Other Ambulatory Visit: Payer: Self-pay

## 2018-08-21 DIAGNOSIS — Z8546 Personal history of malignant neoplasm of prostate: Secondary | ICD-10-CM

## 2018-08-21 DIAGNOSIS — Z125 Encounter for screening for malignant neoplasm of prostate: Secondary | ICD-10-CM

## 2018-08-23 ENCOUNTER — Other Ambulatory Visit
Admission: RE | Admit: 2018-08-23 | Discharge: 2018-08-23 | Disposition: A | Discharge status: Home / Self Care | Payer: Medicare Other | Attending: Urology | Admitting: Urology

## 2018-08-23 ENCOUNTER — Other Ambulatory Visit: Payer: Self-pay

## 2018-08-23 DIAGNOSIS — Z8546 Personal history of malignant neoplasm of prostate: Secondary | ICD-10-CM | POA: Insufficient documentation

## 2018-08-23 LAB — PSA: Prostatic Specific Antigen: 0.03 ng/mL (ref 0.00–4.00)

## 2018-08-24 ENCOUNTER — Encounter: Payer: Self-pay | Admitting: Urology

## 2018-08-24 ENCOUNTER — Ambulatory Visit (INDEPENDENT_AMBULATORY_CARE_PROVIDER_SITE_OTHER): Payer: Medicare Other | Admitting: Urology

## 2018-08-24 VITALS — BP 116/66 | HR 83 | Ht 67.0 in | Wt 144.0 lb

## 2018-08-24 DIAGNOSIS — Z87448 Personal history of other diseases of urinary system: Secondary | ICD-10-CM

## 2018-08-24 DIAGNOSIS — Z8546 Personal history of malignant neoplasm of prostate: Secondary | ICD-10-CM

## 2018-08-24 DIAGNOSIS — I6523 Occlusion and stenosis of bilateral carotid arteries: Secondary | ICD-10-CM | POA: Diagnosis not present

## 2018-08-24 NOTE — Progress Notes (Signed)
08/24/2018 11:26 AM   Dylan Villanueva Nov 07, 1940 [id number]  Referring provider: Juline Patch, MD [address] [place],  [place] [address]  Chief Complaint  Patient presents with  . Prostate Cancer    1year    HPI: 78 year old male with a personal history of prostate cancer and history of gross hematuria who returns today for routine annual follow-up.  He has a personal history of prostate cancer status post XRT and brachytherapy boost in ? 2008.  He reports his PSA stayed under 1 and was checked annually.    His PSA today 0.03 which is stable from last year.  He does have a personal history of gross hematuria in the setting of antiplatelet therapy.  He underwent evaluation last year in the form of CT urogram and cystoscopy.  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  On cystoscopy, he was noted to have a slight bulbar urethral stricture but otherwise was unremarkable.  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  He had a "touch up" operation.  He is had no further episodes of gross hematuria.   He is voiding well today.  He has occasional stress incontinence with coughing but otherwise no urgency, frequency, dysuria or UTIs.  He has a good urinary stream.  He feels like he empties for the most part.  He is pleased with his voiding.  PMH: Past Medical History:  Diagnosis Date  . Cancer [facility]) 2006   prostate   . Colon polyps   . Hyperlipidemia   . Hypertension   . Myocardial infarction ([place])   . Prostate CA ([place])   . Smoker   . Squamous acanthoma of external ear, right     Surgical History: Past Surgical History:  Procedure Laterality Date  . APPENDECTOMY  1980  . CARDIAC SURGERY    . COLONOSCOPY WITH PROPOFOL N/A 03/02/2018   Procedure: COLONOSCOPY WITH PROPOFOL;  Surgeon: [name], MD;  Location: [place];  Service: Endoscopy;   Laterality: N/A;  . CORONARY ANGIOPLASTY WITH STENT PLACEMENT    . ESOPHAGOGASTRODUODENOSCOPY N/A 11/07/2017   Procedure: ESOPHAGOGASTRODUODENOSCOPY (EGD);  Surgeon: Jules Husbands, MD;  Location: [facility];  Service: General;  Laterality: N/A;  . ESOPHAGOGASTRODUODENOSCOPY (EGD) WITH PROPOFOL  03/02/2018   Procedure: ESOPHAGOGASTRODUODENOSCOPY (EGD) WITH PROPOFOL;  Surgeon: [name], MD;  Location: [place];  Service: Endoscopy;;  . EUS N/A 07/13/2017   Procedure: FULL UPPER ENDOSCOPIC ULTRASOUND (EUS) RADIAL;  Surgeon: Jola Schmidt, MD;  Location: [facility];  Service: Endoscopy;  Laterality: N/A;  . HIGH DOSE RADIATION IMPLANT INSERTION  2006  . LAPAROSCOPIC REMOVAL ABDOMINAL MASS N/A 11/07/2017   Procedure: LAPAROSCOPIC GASTRIC MASS RESECTION;  Surgeon: Jules Husbands, MD;  Location: [facility];  Service: General;  Laterality: N/A;  . LOWER EXTREMITY ANGIOGRAPHY Left 07/04/2018   Procedure: LOWER EXTREMITY ANGIOGRAPHY;  Surgeon: Katha Cabal, MD;  Location: [place] [facility];  Service: Cardiovascular;  Laterality: Left;  . LOWER EXTREMITY ANGIOGRAPHY Left 08/10/2018   Procedure: LOWER EXTREMITY ANGIOGRAPHY;  Surgeon: Katha Cabal, MD;  Location: [place] CV [facility];  Service: Cardiovascular;  Laterality: Left;  . POLYPECTOMY  03/02/2018   Procedure: POLYPECTOMY;  Surgeon: [name], MD;  Location: [place];  Service: Endoscopy;;    Home Medications:  Allergies as of 08/24/2018      Reactions   Penicillins Nausea Only  Has patient had a PCN reaction causing immediate rash, facial/tongue/throat swelling, SOB or lightheadedness with hypotension: no Has patient had a PCN reaction causing severe rash involving mucus membranes or skin necrosis: no Has patient had a PCN reaction that required hospitalization: no Has patient had a PCN reaction occurring within the last 10 years: no If all of the above answers are "NO", then may proceed with Cephalosporin  use.      Medication List       Accurate as of August 24, 2018 11:26 AM. If you have any questions, ask your nurse or doctor.        amLODipine 10 MG tablet Commonly known as: NORVASC Take 1 tablet (10 mg total) by mouth daily.   atorvastatin 40 MG tablet Commonly known as: LIPITOR Take 1 tablet (40 mg total) by mouth daily.   cilostazol 100 MG tablet Commonly known as: PLETAL TAKE 1 TABLET(100 MG) BY MOUTH TWICE DAILY   clopidogrel 75 MG tablet Commonly known as: PLAVIX TAKE 1 TABLET(75 MG) BY MOUTH DAILY What changed: See the new instructions.   lisinopril 5 MG tablet Commonly known as: ZESTRIL Take 1 tablet (5 mg total) by mouth daily.   metoCLOPramide 10 MG tablet Commonly known as: Reglan Take 1 tablet (10 mg total) by mouth 3 (three) times daily.   MULTIVITAMIN ADULT PO Take 1 tablet by mouth daily.   Vitamin D (Cholecalciferol) 25 MCG (1000 UT) Caps Take 1 capsule by mouth daily.       Allergies:  Allergies  Allergen Reactions  . Penicillins Nausea Only    Has patient had a PCN reaction causing immediate rash, facial/tongue/throat swelling, SOB or lightheadedness with hypotension: no Has patient had a PCN reaction causing severe rash involving mucus membranes or skin necrosis: no Has patient had a PCN reaction that required hospitalization: no Has patient had a PCN reaction occurring within the last 10 years: no If all of the above answers are "NO", then may proceed with Cephalosporin use.     Family History: Family History  Problem Relation Age of Onset  . Cancer Mother        pancreatic  . Dementia Father   . Cancer Sister        breast   . Aneurysm Brother     Social History:  reports that he has been smoking cigarettes. He has a 66.00 pack-year smoking history. He has never used smokeless tobacco. He reports current alcohol use of about 3.0 standard drinks of alcohol per week. He reports that he does not use drugs.  ROS: UROLOGY Frequent  Urination?: No Hard to postpone urination?: No Burning/pain with urination?: No Get up at night to urinate?: No Leakage of urine?: Yes Urine stream starts and stops?: No Trouble starting stream?: No Do you have to strain to urinate?: No Blood in urine?: No Urinary tract infection?: No Sexually transmitted disease?: No Injury to kidneys or bladder?: No Painful intercourse?: No Weak stream?: No Erection problems?: No Penile pain?: No  Gastrointestinal Nausea?: No Vomiting?: No Indigestion/heartburn?: No Diarrhea?: No Constipation?: No  Constitutional Fever: No Night sweats?: No Weight loss?: No Fatigue?: No  Skin Skin rash/lesions?: No Itching?: No  Eyes Blurred vision?: No Double vision?: No  Ears/Nose/Throat Sore throat?: No Sinus problems?: No  Hematologic/Lymphatic Swollen glands?: No Easy bruising?: Yes  Cardiovascular Leg swelling?: No Chest pain?: No  Respiratory Cough?: No Shortness of breath?: No  Endocrine Excessive thirst?: No  Musculoskeletal Back pain?: No Joint pain?: No  Neurological Headaches?: No Dizziness?: No  Psychologic Depression?: No Anxiety?: No  Physical Exam: BP 116/66   Pulse 83   Ht 5\' 7"  (1.702 m)   Wt 144 lb (65.3 kg)   BMI 22.55 kg/m   Constitutional:  Alert and oriented, No acute distress. HEENT: [place] AT, moist mucus membranes.  Trachea midline, no masses. Cardiovascular: No clubbing, cyanosis, or edema. Respiratory: Normal respiratory effort, no increased work of breathing. GI: Abdomen is soft, nontender, nondistended, no abdominal masses Skin: No rashes, bruises or suspicious lesions. Neurologic: Grossly intact, no focal deficits, moving all 4 extremities. Psychiatric: Normal mood and affect.  Laboratory Data: Lab Results  Component Value Date   WBC 7.3 03/06/2018   HGB 13.8 03/06/2018   HCT 40.8 03/06/2018   MCV 100.7 (H) 03/06/2018   PLT 241 03/06/2018    Lab Results  Component Value Date    CREATININE 0.81 08/07/2018    Urinalysis    Component Value Date/Time   COLORURINE YELLOW 07/21/2017 1425   APPEARANCEUR CLEAR 07/21/2017 1425   LABSPEC 1.015 07/21/2017 1425   PHURINE 6.5 07/21/2017 1425   GLUCOSEU NEGATIVE 07/21/2017 1425   HGBUR NEGATIVE 07/21/2017 1425   BILIRUBINUR SMALL (A) 07/21/2017 1425   KETONESUR 15 (A) 07/21/2017 1425   PROTEINUR 30 (A) 07/21/2017 1425   NITRITE NEGATIVE 07/21/2017 1425   LEUKOCYTESUR NEGATIVE 07/21/2017 1425    Lab Results  Component Value Date   BACTERIA NONE SEEN 07/21/2017    Assessment & Plan:    1. History of prostate cancer PSA is stable and low, will continue to check annually No evidence of disease  2. History of hematuria No further episodes of gross hematuria status post extensive work-up last year We will recheck a UA next year   Return in about 1 year (around 08/24/2019) for PSA/ UA.  Hollice Espy, MD  [facility] [address], [place], [place] [address] [phone]

## 2018-08-30 ENCOUNTER — Other Ambulatory Visit (INDEPENDENT_AMBULATORY_CARE_PROVIDER_SITE_OTHER): Payer: Self-pay | Admitting: Vascular Surgery

## 2018-08-30 ENCOUNTER — Ambulatory Visit (INDEPENDENT_AMBULATORY_CARE_PROVIDER_SITE_OTHER): Payer: Medicare Other | Admitting: Vascular Surgery

## 2018-08-30 ENCOUNTER — Other Ambulatory Visit: Payer: Self-pay

## 2018-08-30 ENCOUNTER — Ambulatory Visit (INDEPENDENT_AMBULATORY_CARE_PROVIDER_SITE_OTHER): Payer: Medicare Other

## 2018-08-30 ENCOUNTER — Telehealth (INDEPENDENT_AMBULATORY_CARE_PROVIDER_SITE_OTHER): Payer: Self-pay

## 2018-08-30 ENCOUNTER — Encounter (INDEPENDENT_AMBULATORY_CARE_PROVIDER_SITE_OTHER): Payer: Self-pay | Admitting: Vascular Surgery

## 2018-08-30 VITALS — BP 124/61 | HR 65 | Resp 16 | Ht 67.0 in | Wt 139.6 lb

## 2018-08-30 DIAGNOSIS — Z9862 Peripheral vascular angioplasty status: Secondary | ICD-10-CM

## 2018-08-30 DIAGNOSIS — F17210 Nicotine dependence, cigarettes, uncomplicated: Secondary | ICD-10-CM | POA: Diagnosis not present

## 2018-08-30 DIAGNOSIS — I6523 Occlusion and stenosis of bilateral carotid arteries: Secondary | ICD-10-CM

## 2018-08-30 DIAGNOSIS — Z79899 Other long term (current) drug therapy: Secondary | ICD-10-CM | POA: Diagnosis not present

## 2018-08-30 DIAGNOSIS — I70223 Atherosclerosis of native arteries of extremities with rest pain, bilateral legs: Secondary | ICD-10-CM

## 2018-08-30 DIAGNOSIS — I872 Venous insufficiency (chronic) (peripheral): Secondary | ICD-10-CM

## 2018-08-30 DIAGNOSIS — Z7902 Long term (current) use of antithrombotics/antiplatelets: Secondary | ICD-10-CM

## 2018-08-30 DIAGNOSIS — Z955 Presence of coronary angioplasty implant and graft: Secondary | ICD-10-CM | POA: Diagnosis not present

## 2018-08-30 DIAGNOSIS — I10 Essential (primary) hypertension: Secondary | ICD-10-CM | POA: Diagnosis not present

## 2018-08-30 DIAGNOSIS — I2510 Atherosclerotic heart disease of native coronary artery without angina pectoris: Secondary | ICD-10-CM

## 2018-08-30 DIAGNOSIS — I739 Peripheral vascular disease, unspecified: Secondary | ICD-10-CM | POA: Diagnosis not present

## 2018-08-30 NOTE — Telephone Encounter (Signed)
I attempted to contact the patient regarding his procedure with Dr. Delana Meyer, a message was left for a return call.

## 2018-08-31 ENCOUNTER — Other Ambulatory Visit: Payer: Self-pay

## 2018-08-31 DIAGNOSIS — I70219 Atherosclerosis of native arteries of extremities with intermittent claudication, unspecified extremity: Secondary | ICD-10-CM

## 2018-08-31 MED ORDER — CILOSTAZOL 100 MG PO TABS: ORAL_TABLET | ORAL | 0 refills | Status: DC

## 2018-09-02 ENCOUNTER — Other Ambulatory Visit (INDEPENDENT_AMBULATORY_CARE_PROVIDER_SITE_OTHER): Payer: Self-pay | Admitting: Nurse Practitioner

## 2018-09-03 ENCOUNTER — Other Ambulatory Visit (INDEPENDENT_AMBULATORY_CARE_PROVIDER_SITE_OTHER): Payer: Self-pay

## 2018-09-05 NOTE — Progress Notes (Addendum)
MRN : [id number]  Dylan Villanueva is a 78 y.o. (02-20-1940) male who presents with chief complaint of  Chief Complaint  Patient presents with   Follow-up    [facility] 3week abi  .  History of Present Illness:   The patient returns to the office for followup and review status post angiogram without intervention. The patient denies improvement in the lower extremity symptoms. No interval shortening of the patient's claudication distance or rest pain symptoms. No new ulcers or wounds have occurred since the last visit.  There have been no significant changes to the patient's overall health care.  The patient denies amaurosis fugax or recent TIA symptoms. There are no recent neurological changes noted. The patient denies history of DVT, PE or superficial thrombophlebitis. The patient denies recent episodes of angina or shortness of breath.     Current Meds  Medication Sig   amLODipine (NORVASC) 10 MG tablet Take 1 tablet (10 mg total) by mouth daily.   atorvastatin (LIPITOR) 40 MG tablet Take 1 tablet (40 mg total) by mouth daily.   clopidogrel (PLAVIX) 75 MG tablet TAKE 1 TABLET(75 MG) BY MOUTH DAILY (Patient taking differently: Take 75 mg by mouth daily. TAKE 1 TABLET(75 MG) BY MOUTH DAILY)   lisinopril (ZESTRIL) 5 MG tablet Take 1 tablet (5 mg total) by mouth daily.   metoCLOPramide (REGLAN) 10 MG tablet Take 1 tablet (10 mg total) by mouth 3 (three) times daily.   Multiple Vitamins-Minerals (MULTIVITAMIN ADULT PO) Take 1 tablet by mouth daily.    Vitamin D, Cholecalciferol, 1000 units CAPS Take 1 capsule by mouth daily.   [DISCONTINUED] cilostazol (PLETAL) 100 MG tablet TAKE 1 TABLET(100 MG) BY MOUTH TWICE DAILY    Past Medical History:  Diagnosis Date   Cancer [facility]) 2006   prostate    Colon polyps    Hyperlipidemia    Hypertension    Myocardial infarction ([place])    Prostate CA ([place])    Smoker    Squamous acanthoma of external ear, right     Past Surgical  History:  Procedure Laterality Date   APPENDECTOMY  1980   CARDIAC SURGERY     COLONOSCOPY WITH PROPOFOL N/A 03/02/2018   Procedure: COLONOSCOPY WITH PROPOFOL;  Surgeon: [name], MD;  Location: [place];  Service: Endoscopy;  Laterality: N/A;   CORONARY ANGIOPLASTY WITH STENT PLACEMENT     ESOPHAGOGASTRODUODENOSCOPY N/A 11/07/2017   Procedure: ESOPHAGOGASTRODUODENOSCOPY (EGD);  Surgeon: Jules Husbands, MD;  Location: [facility];  Service: General;  Laterality: N/A;   ESOPHAGOGASTRODUODENOSCOPY (EGD) WITH PROPOFOL  03/02/2018   Procedure: ESOPHAGOGASTRODUODENOSCOPY (EGD) WITH PROPOFOL;  Surgeon: [name], MD;  Location: [place];  Service: Endoscopy;;   EUS N/A 07/13/2017   Procedure: FULL UPPER ENDOSCOPIC ULTRASOUND (EUS) RADIAL;  Surgeon: Jola Schmidt, MD;  Location: [facility];  Service: Endoscopy;  Laterality: N/A;   HIGH DOSE RADIATION IMPLANT INSERTION  2006   LAPAROSCOPIC REMOVAL ABDOMINAL MASS N/A 11/07/2017   Procedure: LAPAROSCOPIC GASTRIC MASS RESECTION;  Surgeon: Jules Husbands, MD;  Location: [facility];  Service: General;  Laterality: N/A;   LOWER EXTREMITY ANGIOGRAPHY Left 07/04/2018   Procedure: LOWER EXTREMITY ANGIOGRAPHY;  Surgeon: Katha Cabal, MD;  Location: [place] [facility];  Service: Cardiovascular;  Laterality: Left;   LOWER EXTREMITY ANGIOGRAPHY Left 08/10/2018   Procedure: LOWER EXTREMITY ANGIOGRAPHY;  Surgeon: Katha Cabal, MD;  Location: [place] [facility];  Service: Cardiovascular;  Laterality: Left;   POLYPECTOMY  03/02/2018  Procedure: POLYPECTOMY;  Surgeon: [name], MD;  [facility]: [facility];  Service: Endoscopy;;    Social History Social History   Tobacco Use   Smoking status: Current Every Day Smoker    Packs/day: 1.00    Years: 66.00    Pack years: 66.00    Types: Cigarettes   Smokeless tobacco: Never Used   Tobacco comment:      Substance Use Topics   Alcohol use: Yes     Alcohol/week: 3.0 standard drinks    Types: 3 Cans of beer per week   Drug use: Never    Family History Family History  Problem Relation Age of Onset   Cancer Mother        pancreatic   Dementia Father    Cancer Sister        breast    Aneurysm Brother     Allergies  Allergen Reactions   Penicillins Nausea Only    Has patient had a PCN reaction causing immediate rash, facial/tongue/throat swelling, SOB or lightheadedness with hypotension: no Has patient had a PCN reaction causing severe rash involving mucus membranes or skin necrosis: no Has patient had a PCN reaction that required hospitalization: no Has patient had a PCN reaction occurring within the last 10 years: no If all of the above answers are "NO", then may proceed with Cephalosporin use.      REVIEW OF SYSTEMS (Negative unless checked)  Constitutional: [] Weight loss  [] Fever  [] Chills Cardiac: [] Chest pain   [] Chest pressure   [] Palpitations   [] Shortness of breath when laying flat   [] Shortness of breath with exertion. Vascular:  [x] Pain in legs with walking   [x] Pain in legs at rest  [] History of DVT   [] Phlebitis   [] Swelling in legs   [] Varicose veins   [] Non-healing ulcers Pulmonary:   [] Uses home oxygen   [] Productive cough   [] Hemoptysis   [] Wheeze  [] COPD   [] Asthma Neurologic:  [] Dizziness   [] Seizures   [] History of stroke   [] History of TIA  [] Aphasia   [] Vissual changes   [] Weakness or numbness in arm   [] Weakness or numbness in leg Musculoskeletal:   [] Joint swelling   [] Joint pain   [] Low back pain Hematologic:  [] Easy bruising  [] Easy bleeding   [] Hypercoagulable state   [] Anemic Gastrointestinal:  [] Diarrhea   [] Vomiting  [] Gastroesophageal reflux/heartburn   [] Difficulty swallowing. Genitourinary:  [] Chronic kidney disease   [] Difficult urination  [] Frequent urination   [] Blood in urine Skin:  [] Rashes   [] Ulcers  Psychological:  [] History of anxiety   []  History of major  depression.  Physical Examination  Vitals:   08/30/18 1132  BP: 124/61  Pulse: 65  Resp: 16  Weight: 139 lb 9.6 oz (63.3 kg)  Height: 5\' 7"  (1.702 m)   Body mass index is 21.86 kg/m. Gen: WD/WN, NAD Head: [place]/AT, No temporalis wasting.  Ear/Nose/Throat: Hearing grossly intact, nares w/o erythema or drainage Eyes: PER, EOMI, sclera nonicteric.  Neck: Supple, no large masses.   Pulmonary:  Good air movement, no audible wheezing bilaterally, no use of accessory muscles.  Cardiac: RRR, no JVD Vascular: feet cool to touch with 3 second cap refill Vessel Right Left  Radial Palpable Palpable  PT Not Palpable Not Palpable  DP Not Palpable Not Palpable  Gastrointestinal: Non-distended. No guarding/no peritoneal signs.  Musculoskeletal: M/S 5/5 throughout.  No deformity or atrophy.  Neurologic: CN 2-12 intact. Symmetrical.  Speech is fluent. Motor exam as listed above. Psychiatric: Judgment  intact, Mood & affect appropriate for pt's clinical situation. Dermatologic: No rashes or ulcers noted.  No changes consistent with cellulitis. Lymph : No lichenification or skin changes of chronic lymphedema.  CBC Lab Results  Component Value Date   WBC 7.3 03/06/2018   HGB 13.8 03/06/2018   HCT 40.8 03/06/2018   MCV 100.7 (H) 03/06/2018   PLT 241 03/06/2018    BMET    Component Value Date/Time   NA 140 03/06/2018 0900   NA 142 05/08/2017 1142   K 4.0 03/06/2018 0900   CL 105 03/06/2018 0900   CO2 28 03/06/2018 0900   GLUCOSE 126 (H) 03/06/2018 0900   BUN 14 08/07/2018 1052   BUN 15 05/08/2017 1142   CREATININE 0.81 08/07/2018 1052   CALCIUM 9.0 03/06/2018 0900   GFRNONAA >60 08/07/2018 1052   GFRAA >60 08/07/2018 1052   CrCl cannot be calculated (Patient's most recent lab result is older than the maximum 21 days allowed.).  COAG Lab Results  Component Value Date   INR 0.98 11/02/2017    Radiology Vas [place] Burnard Bunting With/wo Tbi  Result Date: 08/30/2018 LOWER EXTREMITY DOPPLER  STUDY Indications: Rest pain, peripheral artery disease, and Left PTA of the deep              profunda; failed atherectomy of SFA.  Comparison Study: 06/25/2018 Performing Technologist: Concha Norway RVT  Examination Guidelines: A complete evaluation includes at minimum, Doppler waveform signals and systolic blood pressure reading at the level of bilateral brachial, anterior tibial, and posterior tibial arteries, when vessel segments are accessible. Bilateral testing is considered an integral part of a complete examination. Photoelectric Plethysmograph (PPG) waveforms and toe systolic pressure readings are included as required and additional duplex testing as needed. Limited examinations for reoccurring indications may be performed as noted.  ABI Findings: +--------+------------------+-----+--------+--------+  Right    Rt Pressure (mmHg) Index Waveform Comment   +--------+------------------+-----+--------+--------+  Brachial 142                                         +--------+------------------+-----+--------+--------+  ATA      118                0.83  biphasic           +--------+------------------+-----+--------+--------+  PTA      133                0.94  biphasic           +--------+------------------+-----+--------+--------+ +----+------------------+-----+----------+-------+  Left Lt Pressure (mmHg) Index Waveform   Comment  +----+------------------+-----+----------+-------+  ATA  63                 0.44  monophasic          +----+------------------+-----+----------+-------+  PTA  76                 0.54  monophasic          +----+------------------+-----+----------+-------+ +-------+-----------+-----------+------------+------------+  ABI/TBI Today's ABI Today's TBI Previous ABI Previous TBI  +-------+-----------+-----------+------------+------------+  Right   .94                     .85                        +-------+-----------+-----------+------------+------------+  Left    .54                     .  56                         +-------+-----------+-----------+------------+------------+ Right ABIs appear increased compared to prior study on 06/25/2018. Left ABIs appear essentially unchanged compared to prior study on 06/25/2018.  Summary: Right: Resting right ankle-brachial index indicates mild right lower extremity arterial disease. Left: Resting left ankle-brachial index indicates moderate left lower extremity arterial disease.  *See table(s) above for measurements and observations.  Electronically signed by Hortencia Pilar MD on 08/30/2018 at 4:54:01 PM.   Final       Assessment/Plan 1. Atherosclerosis of native artery of both lower extremities with rest pain ([place]) Recommend:  The patient has evidence of severe atherosclerotic changes of both lower extremities with rest pain that is associated with preulcerative changes and impending tissue loss of the left foot.  This represents a limb threatening ischemia and places the patient at the risk for limb loss.  Patient should undergo angiography of the left lower extremity with the hope for intervention for limb salvage.  The risks and benefits as well as the alternative therapies was discussed in detail with the patient.  All questions were answered.  Patient agrees to proceed with left leg angiography.  The need for pedal approach was also reviewed.  The patient will follow up with me in the office after the procedure.    A total of 35 minutes was spent with this patient and greater than 50% was spent in counseling and coordination of care with the patient.  Discussion included the treatment options for vascular disease including indications for surgery and intervention.  Also discussed is the appropriate timing of treatment.  In addition medical therapy was discussed.  2. Bilateral carotid artery stenosis Recommend:  Given the patient's asymptomatic subcritical stenosis no further invasive testing or surgery at this time.  Continue antiplatelet  therapy as prescribed Continue management of CAD, HTN and Hyperlipidemia Healthy heart diet,  encouraged exercise at least 4 times per week Follow up in 6 months with duplex ultrasound and physical exam   3. Chronic venous insufficiency No surgery or intervention at this point in time.    I have had a long discussion with the patient regarding venous insufficiency and why it  causes symptoms. I have discussed with the patient the chronic skin changes that accompany venous insufficiency and the long term sequela such as infection and ulceration.  Patient will begin wearing graduated compression stockings class 1 (20-30 mmHg) or compression wraps on a daily basis a prescription was given. The patient will put the stockings on first thing in the morning and removing them in the evening. The patient is instructed specifically not to sleep in the stockings.    In addition, behavioral modification including several periods of elevation of the lower extremities during the day will be continued. I have demonstrated that proper elevation is a position with the ankles at heart level.  The patient is instructed to begin routine exercise, especially walking on a daily basis   4. Presence of stent in coronary artery in patient with coronary artery disease Continue cardiac and antihypertensive medications as already ordered and reviewed, no changes at this time.  Continue statin as ordered and reviewed, no changes at this time  Nitrates PRN for chest pain   5. Essential hypertension Continue antihypertensive medications as already ordered, these medications have been reviewed and there are no changes at this time.    Hortencia Pilar, MD  09/05/2018  2:35 PM

## 2018-09-07 ENCOUNTER — Other Ambulatory Visit
Admission: RE | Admit: 2018-09-07 | Discharge: 2018-09-07 | Disposition: A | Discharge status: Home / Self Care | Payer: Medicare Other | Source: Ambulatory Visit | Attending: Vascular Surgery | Admitting: Vascular Surgery

## 2018-09-07 ENCOUNTER — Other Ambulatory Visit: Payer: Self-pay

## 2018-09-07 DIAGNOSIS — Z1159 Encounter for screening for other viral diseases: Secondary | ICD-10-CM | POA: Diagnosis not present

## 2018-09-08 LAB — SARS CORONAVIRUS 2 (TAT 6-24 HRS): SARS Coronavirus 2: NEGATIVE

## 2018-09-11 MED ORDER — CLINDAMYCIN PHOSPHATE 300 MG/50ML IV SOLN: 300.0000 mg | Freq: Once | INTRAVENOUS | Status: DC

## 2018-09-12 ENCOUNTER — Other Ambulatory Visit: Payer: Self-pay

## 2018-09-12 ENCOUNTER — Ambulatory Visit
Admission: RE | Admit: 2018-09-12 | Discharge: 2018-09-12 | Disposition: A | Discharge status: Home / Self Care | Payer: Medicare Other | Attending: Vascular Surgery | Admitting: Vascular Surgery

## 2018-09-12 ENCOUNTER — Encounter
Admission: RE | Disposition: A | Discharge status: Home / Self Care | Payer: Self-pay | Source: Home / Self Care | Attending: Vascular Surgery

## 2018-09-12 DIAGNOSIS — I2510 Atherosclerotic heart disease of native coronary artery without angina pectoris: Secondary | ICD-10-CM | POA: Insufficient documentation

## 2018-09-12 DIAGNOSIS — E785 Hyperlipidemia, unspecified: Secondary | ICD-10-CM | POA: Diagnosis not present

## 2018-09-12 DIAGNOSIS — I6523 Occlusion and stenosis of bilateral carotid arteries: Secondary | ICD-10-CM | POA: Diagnosis not present

## 2018-09-12 DIAGNOSIS — T82856A Stenosis of peripheral vascular stent, initial encounter: Secondary | ICD-10-CM | POA: Diagnosis not present

## 2018-09-12 DIAGNOSIS — I70222 Atherosclerosis of native arteries of extremities with rest pain, left leg: Secondary | ICD-10-CM | POA: Diagnosis not present

## 2018-09-12 DIAGNOSIS — F17210 Nicotine dependence, cigarettes, uncomplicated: Secondary | ICD-10-CM | POA: Diagnosis not present

## 2018-09-12 DIAGNOSIS — Z880 Allergy status to penicillin: Secondary | ICD-10-CM | POA: Insufficient documentation

## 2018-09-12 DIAGNOSIS — I872 Venous insufficiency (chronic) (peripheral): Secondary | ICD-10-CM | POA: Insufficient documentation

## 2018-09-12 DIAGNOSIS — Z79899 Other long term (current) drug therapy: Secondary | ICD-10-CM | POA: Diagnosis not present

## 2018-09-12 DIAGNOSIS — I252 Old myocardial infarction: Secondary | ICD-10-CM | POA: Diagnosis not present

## 2018-09-12 DIAGNOSIS — Z7902 Long term (current) use of antithrombotics/antiplatelets: Secondary | ICD-10-CM | POA: Diagnosis not present

## 2018-09-12 DIAGNOSIS — I10 Essential (primary) hypertension: Secondary | ICD-10-CM | POA: Insufficient documentation

## 2018-09-12 DIAGNOSIS — Z955 Presence of coronary angioplasty implant and graft: Secondary | ICD-10-CM | POA: Insufficient documentation

## 2018-09-12 DIAGNOSIS — I70229 Atherosclerosis of native arteries of extremities with rest pain, unspecified extremity: Secondary | ICD-10-CM

## 2018-09-12 HISTORY — PX: LOWER EXTREMITY ANGIOGRAPHY: CATH118251

## 2018-09-12 LAB — CREATININE, SERUM
Creatinine, Ser: 0.87 mg/dL (ref 0.61–1.24)
GFR calc Af Amer: >60 mL/min (ref >60)
GFR calc non Af Amer: >60 mL/min (ref >60)

## 2018-09-12 LAB — BUN: BUN: 17 mg/dL (ref 8–23)

## 2018-09-12 SURGERY — LOWER EXTREMITY ANGIOGRAPHY
Anesthesia: Moderate Sedation | Laterality: Left

## 2018-09-12 MED ORDER — METHYLPREDNISOLONE SODIUM SUCC 125 MG IJ SOLR: 125.0000 mg | Freq: Once | INTRAMUSCULAR | Status: DC | PRN

## 2018-09-12 MED ORDER — ONDANSETRON HCL 4 MG/2ML IJ SOLN: 4.0000 mg | Freq: Four times a day (QID) | INTRAMUSCULAR | Status: DC | PRN

## 2018-09-12 MED ORDER — HEPARIN SODIUM (PORCINE) 1000 UNIT/ML IJ SOLN
INTRAMUSCULAR | Status: AC
  Filled 2018-09-12: qty 1

## 2018-09-12 MED ORDER — CLINDAMYCIN PHOSPHATE 300 MG/50ML IV SOLN
INTRAVENOUS | Status: AC
  Administered 2018-09-12: 08:00:00
  Filled 2018-09-12: qty 50

## 2018-09-12 MED ORDER — FENTANYL CITRATE (PF) 100 MCG/2ML IJ SOLN
INTRAMUSCULAR | Status: AC
  Filled 2018-09-12: qty 2

## 2018-09-12 MED ORDER — LIDOCAINE HCL (PF) 1 % IJ SOLN
INTRAMUSCULAR | Status: AC
  Filled 2018-09-12: qty 30

## 2018-09-12 MED ORDER — LABETALOL HCL 5 MG/ML IV SOLN: 10.0000 mg | INTRAVENOUS | Status: DC | PRN

## 2018-09-12 MED ORDER — HEPARIN SODIUM (PORCINE) 1000 UNIT/ML IJ SOLN
INTRAMUSCULAR | Status: DC | PRN
  Administered 2018-09-12: 3000 [IU] via INTRAVENOUS
  Administered 2018-09-12 (×2): 2000 [IU] via INTRAVENOUS
  Administered 2018-09-12: 3000 [IU] via INTRAVENOUS

## 2018-09-12 MED ORDER — ACETAMINOPHEN 325 MG PO TABS: 650.0000 mg | ORAL_TABLET | ORAL | Status: DC | PRN

## 2018-09-12 MED ORDER — MORPHINE SULFATE (PF) 4 MG/ML IV SOLN: 2.0000 mg | INTRAVENOUS | Status: DC | PRN

## 2018-09-12 MED ORDER — FAMOTIDINE 20 MG PO TABS: 40.0000 mg | ORAL_TABLET | Freq: Once | ORAL | Status: DC | PRN

## 2018-09-12 MED ORDER — SODIUM CHLORIDE 0.9 % IV BOLUS: 1000.0000 mL | Freq: Once | INTRAVENOUS | Status: DC

## 2018-09-12 MED ORDER — MIDAZOLAM HCL 2 MG/2ML IJ SOLN
INTRAMUSCULAR | Status: AC
  Filled 2018-09-12: qty 2

## 2018-09-12 MED ORDER — OXYCODONE HCL 5 MG PO TABS: 5.0000 mg | ORAL_TABLET | ORAL | Status: DC | PRN

## 2018-09-12 MED ORDER — SODIUM CHLORIDE 0.9% FLUSH: 3.0000 mL | Freq: Two times a day (BID) | INTRAVENOUS | Status: DC

## 2018-09-12 MED ORDER — APIXABAN 5 MG PO TABS: 5.0000 mg | ORAL_TABLET | Freq: Two times a day (BID) | ORAL | 4 refills | Status: DC

## 2018-09-12 MED ORDER — MIDAZOLAM HCL 2 MG/ML PO SYRP: 8.0000 mg | ORAL_SOLUTION | Freq: Once | ORAL | Status: DC | PRN

## 2018-09-12 MED ORDER — SODIUM CHLORIDE 0.9 % IV SOLN: INTRAVENOUS | Status: DC

## 2018-09-12 MED ORDER — SODIUM CHLORIDE 0.9 % IV SOLN: 250.0000 mL | INTRAVENOUS | Status: DC | PRN

## 2018-09-12 MED ORDER — SODIUM CHLORIDE 0.9% FLUSH: 3.0000 mL | INTRAVENOUS | Status: DC | PRN

## 2018-09-12 MED ORDER — DIPHENHYDRAMINE HCL 50 MG/ML IJ SOLN: 50.0000 mg | Freq: Once | INTRAMUSCULAR | Status: DC | PRN

## 2018-09-12 MED ORDER — MIDAZOLAM HCL 2 MG/2ML IJ SOLN
INTRAMUSCULAR | Status: DC | PRN
  Administered 2018-09-12: 2 mg via INTRAVENOUS
  Administered 2018-09-12 (×3): 1 mg via INTRAVENOUS

## 2018-09-12 MED ORDER — FENTANYL CITRATE (PF) 100 MCG/2ML IJ SOLN
INTRAMUSCULAR | Status: DC | PRN
  Administered 2018-09-12 (×4): 50 ug via INTRAVENOUS

## 2018-09-12 MED ORDER — MIDAZOLAM HCL 2 MG/2ML IJ SOLN
INTRAMUSCULAR | Status: AC
  Filled 2018-09-12: qty 4

## 2018-09-12 MED ORDER — HYDROMORPHONE HCL 1 MG/ML IJ SOLN: 1.0000 mg | Freq: Once | INTRAMUSCULAR | Status: DC | PRN

## 2018-09-12 MED ORDER — SODIUM CHLORIDE 0.9 % IV SOLN
INTRAVENOUS | Status: DC
  Administered 2018-09-12: 07:00:00 via INTRAVENOUS

## 2018-09-12 MED ORDER — HYDRALAZINE HCL 20 MG/ML IJ SOLN: 5.0000 mg | INTRAMUSCULAR | Status: DC | PRN

## 2018-09-12 SURGICAL SUPPLY — 56 items
BALLN LUTONIX 018 4X80X130 (BALLOONS) ×3
BALLN LUTONIX 018 5X300X130 (BALLOONS) ×3
BALLN LUTONIX 018 5X60X130 (BALLOONS) ×3
BALLN LUTONIX 018 6X40X130 (BALLOONS) ×3
BALLN LUTONIX 4X150X130 (BALLOONS) ×3
BALLN LUTONIX AV 8X60X75 (BALLOONS) ×3
BALLN ULTRASCORE 4X150X130 (BALLOONS) ×3
BALLN ULTRVRSE 3X100X75C (BALLOONS) ×3
BALLN ULTRVRSE 3X300X150 (BALLOONS) ×2
BALLN ULTRVRSE 3X300X150 OTW (BALLOONS) ×1
BALLN ULTRVRSE 4X300X150 (BALLOONS) ×3
BALLOON LUTONIX 018 4X80X130 (BALLOONS) ×1 IMPLANT
BALLOON LUTONIX 018 5X300X130 (BALLOONS) ×1 IMPLANT
BALLOON LUTONIX 018 5X60X130 (BALLOONS) ×1 IMPLANT
BALLOON LUTONIX 018 6X40X130 (BALLOONS) ×1 IMPLANT
BALLOON LUTONIX 4X150X130 (BALLOONS) ×1 IMPLANT
BALLOON LUTONIX AV 8X60X75 (BALLOONS) ×1 IMPLANT
BALLOON ULTRASCORE 4X150X130 (BALLOONS) ×1 IMPLANT
BALLOON ULTRVRSE 3X100X75C (BALLOONS) ×1 IMPLANT
BALLOON ULTRVRSE 3X300X150 OTW (BALLOONS) ×1 IMPLANT
BALLOON ULTRVRSE 4X300X150 (BALLOONS) ×1 IMPLANT
CATH BEACON 5 .035 65 KMP TIP (CATHETERS) ×3 IMPLANT
CATH BEACON 5 .035 65 RIM TIP (CATHETERS) ×3 IMPLANT
CATH CROSSER 14S OTW 106CM (CATHETERS) ×3 IMPLANT
CATH SEEKER .018X150 (CATHETERS) ×6 IMPLANT
CATH SEEKER .035X135CM (CATHETERS) ×3 IMPLANT
CATH SEEKER 014X150 (CATHETERS) ×3 IMPLANT
COVER PROBE U/S 5X48 (MISCELLANEOUS) ×3 IMPLANT
DEVICE ENSNARE  12MMX20MM (VASCULAR PRODUCTS) ×2
DEVICE ENSNARE 12MMX20MM (VASCULAR PRODUCTS) ×1 IMPLANT
DEVICE PRESTO INFLATION (MISCELLANEOUS) ×6 IMPLANT
DEVICE STARCLOSE SE CLOSURE (Vascular Products) ×3 IMPLANT
DEVICE TORQUE .025-.038 (MISCELLANEOUS) ×3 IMPLANT
DRAPE INCISE IOBAN 66X45 STRL (DRAPES) ×3 IMPLANT
DRAPE TABLE BACK 80X90 (DRAPES) ×3 IMPLANT
GAUZE SPONGE 4X4 12PLY STRL (GAUZE/BANDAGES/DRESSINGS) ×3 IMPLANT
GLIDECATH 4FR STR (CATHETERS) ×3 IMPLANT
GLIDECATH F4/38/100ST (CATHETERS) ×3 IMPLANT
GLIDEWIRE ADV .035X260CM (WIRE) ×3 IMPLANT
GUIDEWIRE PFTE-COATED .018X300 (WIRE) ×3 IMPLANT
GUIDEWIRE SUPER STIFF .035X180 (WIRE) ×3 IMPLANT
KIT FLOWMATE PROCEDURAL (KITS) ×3 IMPLANT
NEEDLE ENTRY 21GA 7CM ECHOTIP (NEEDLE) ×3 IMPLANT
PACK ANGIOGRAPHY (CUSTOM PROCEDURE TRAY) ×3 IMPLANT
SET INTRO CAPELLA COAXIAL (SET/KITS/TRAYS/PACK) ×3 IMPLANT
SHEATH BRITE TIP 6FRX11 (SHEATH) ×3 IMPLANT
SHEATH FLEXOR ANSEL2 7FRX45 (SHEATH) ×3 IMPLANT
SHEATH GLIDE SLENDER 4/5FR (SHEATH) ×3 IMPLANT
SHIELD RADPAD DADD DRAPE 4X9 (MISCELLANEOUS) ×3 IMPLANT
STENT LIFESTENT 5F 5X60X135 (Permanent Stent) ×3 IMPLANT
STENT LIFESTENT 5F 6X150X135 (Permanent Stent) ×6 IMPLANT
STENT LIFESTENT 5F 6X60X135 (Permanent Stent) ×3 IMPLANT
STENT LIFESTENT 5F 7X30X135 (Permanent Stent) IMPLANT
WIRE G V18X300CM (WIRE) ×6 IMPLANT
WIRE HI-TORQ WINN 40 .14X300CM (WIRE) ×3 IMPLANT
WIRE J 3MM .035X145CM (WIRE) ×3 IMPLANT

## 2018-09-12 NOTE — Discharge Instructions (Signed)
Moderate Conscious Sedation, Adult, Care After °These instructions provide you with information about caring for yourself after your procedure. Your health care provider may also give you more specific instructions. Your treatment has been planned according to current medical practices, but problems sometimes occur. Call your health care provider if you have any problems or questions after your procedure. °What can I expect after the procedure? °After your procedure, it is common: °· To feel sleepy for several hours. °· To feel clumsy and have poor balance for several hours. °· To have poor judgment for several hours. °· To vomit if you eat too soon. °Follow these instructions at home: °For at least 24 hours after the procedure: ° °· Do not: °? Participate in activities where you could fall or become injured. °? Drive. °? Use heavy machinery. °? Drink alcohol. °? Take sleeping pills or medicines that cause drowsiness. °? Make important decisions or sign legal documents. °? Take care of children on your own. °· Rest. °Eating and drinking °· Follow the diet recommended by your health care provider. °· If you vomit: °? Drink water, juice, or soup when you can drink without vomiting. °? Make sure you have little or no nausea before eating solid foods. °General instructions °· Have a responsible adult stay with you until you are awake and alert. °· Take over-the-counter and prescription medicines only as told by your health care provider. °· If you smoke, do not smoke without supervision. °· Keep all follow-up visits as told by your health care provider. This is important. °Contact a health care provider if: °· You keep feeling nauseous or you keep vomiting. °· You feel light-headed. °· You develop a rash. °· You have a fever. °Get help right away if: °· You have trouble breathing. °This information is not intended to replace advice given to you by your health care provider. Make sure you discuss any questions you have  with your health care provider. °Document Released: 11/21/2012 Document Revised: 01/13/2017 Document Reviewed: 05/23/2015 °Elsevier Patient Education © 2020 [employer] °Femoral Site Care °This sheet gives you information about how to care for yourself after your procedure. Your health care provider may also give you more specific instructions. If you have problems or questions, contact your health care provider. °What can I expect after the procedure? °After the procedure, it is common to have: °· Bruising that usually fades within 1-2 weeks. °· Tenderness at the site. °Follow these instructions at home: °Wound care °· Follow instructions from your health care provider about how to take care of your insertion site. Make sure you: °? Wash your hands with soap and water before you change your bandage (dressing). If soap and water are not available, use hand sanitizer. °? Change your dressing as told by your health care provider. °? Leave stitches (sutures), skin glue, or adhesive strips in place. These skin closures may need to stay in place for 2 weeks or longer. If adhesive strip edges start to loosen and curl up, you may trim the loose edges. Do not remove adhesive strips completely unless your health care provider tells you to do that. °· Do not take baths, swim, or use a hot tub until your health care provider approves. °· You may shower 24-48 hours after the procedure or as told by your health care provider. °? Gently wash the site with plain soap and water. °? Pat the area dry with a clean towel. °? Do not rub the site. This may cause bleeding. °·   Do not apply powder or lotion to the site. Keep the site clean and dry. °· Check your femoral site every day for signs of infection. Check for: °? Redness, swelling, or pain. °? Fluid or blood. °? Warmth. °? Pus or a bad smell. °Activity °· For the first 2-3 days after your procedure, or as long as directed: °? Avoid climbing stairs as much as possible. °? Do not  squat. °· Do not lift anything that is heavier than 10 lb (4.5 kg), or the limit that you are told, until your health care provider says that it is safe. °· Rest as directed. °? Avoid sitting for a long time without moving. Get up to take short walks every 1-2 hours. °· Do not drive for 24 hours if you were given a medicine to help you relax (sedative). °General instructions °· Take over-the-counter and prescription medicines only as told by your health care provider. °· Keep all follow-up visits as told by your health care provider. This is important. °Contact a health care provider if you have: °· A fever or chills. °· You have redness, swelling, or pain around your insertion site. °Get help right away if: °· The catheter insertion area swells very fast. °· You pass out. °· You suddenly start to sweat or your skin gets clammy. °· The catheter insertion area is bleeding, and the bleeding does not stop when you hold steady pressure on the area. °· The area near or just beyond the catheter insertion site becomes pale, cool, tingly, or numb. °These symptoms may represent a serious problem that is an emergency. Do not wait to see if the symptoms will go away. Get medical help right away. Call your local emergency services (911 in the [place] Do not drive yourself to the hospital. °Summary °· After the procedure, it is common to have bruising that usually fades within 1-2 weeks. °· Check your femoral site every day for signs of infection. °· Do not lift anything that is heavier than 10 lb (4.5 kg), or the limit that you are told, until your health care provider says that it is safe. °This information is not intended to replace advice given to you by your health care provider. Make sure you discuss any questions you have with your health care provider. °Document Released: 10/04/2013 Document Revised: 02/13/2017 Document Reviewed: 02/13/2017 °Elsevier Patient Education © 2020 [employer] °Angiogram, Care After °This  sheet gives you information about how to care for yourself after your procedure. Your doctor may also give you more specific instructions. If you have problems or questions, contact your doctor. °Follow these instructions at home: °Insertion site care °· Follow instructions from your doctor about how to take care of your long, thin tube (catheter) insertion area. Make sure you: °? Wash your hands with soap and water before you change your bandage (dressing). If you cannot use soap and water, use hand sanitizer. °? Change your bandage as told by your doctor. °? Leave stitches (sutures), skin glue, or skin tape (adhesive) strips in place. They may need to stay in place for 2 weeks or longer. If tape strips get loose and curl up, you may trim the loose edges. Do not remove tape strips completely unless your doctor says it is okay. °· Do not take baths, swim, or use a hot tub until your doctor says it is okay. °· You may shower 24-48 hours after the procedure or as told by your doctor. °? Gently wash the area with   plain soap and water. °? Pat the area dry with a clean towel. °? Do not rub the area. This may cause bleeding. °· Do not apply powder or lotion to the area. Keep the area clean and dry. °· Check your insertion area every day for signs of infection. Check for: °? More redness, swelling, or pain. °? Fluid or blood. °? Warmth. °? Pus or a bad smell. °Activity °· Rest as told by your doctor, usually for 1-2 days. °· Do not lift anything that is heavier than 10 lbs. (4.5 kg) or as told by your doctor. °· Do not drive for 24 hours if you were given a medicine to help you relax (sedative). °· Do not drive or use heavy machinery while taking prescription pain medicine. °General instructions ° °· Go back to your normal activities as told by your doctor, usually in about a week. Ask your doctor what activities are safe for you. °· If the insertion area starts to bleed, lie flat and put pressure on the area. If the  bleeding does not stop, get help right away. This is an emergency. °· Drink enough fluid to keep your pee (urine) clear or pale yellow. °· Take over-the-counter and prescription medicines only as told by your doctor. °· Keep all follow-up visits as told by your doctor. This is important. °Contact a doctor if: °· You have a fever. °· You have chills. °· You have more redness, swelling, or pain around your insertion area. °· You have fluid or blood coming from your insertion area. °· The insertion area feels warm to the touch. °· You have pus or a bad smell coming from your insertion area. °· You have more bruising around the insertion area. °· Blood collects in the tissue around the insertion area (hematoma) that may be painful to the touch. °Get help right away if: °· You have a lot of pain in the insertion area. °· The insertion area swells very fast. °· The insertion area is bleeding, and the bleeding does not stop after holding steady pressure on the area. °· The area near or just beyond the insertion area becomes pale, cool, tingly, or numb. °These symptoms may be an emergency. Do not wait to see if the symptoms will go away. Get medical help right away. Call your local emergency services (911 in the [place] Do not drive yourself to the hospital. °Summary °· After the procedure, it is common to have bruising and tenderness at the long, thin tube insertion area. °· After the procedure, it is important to rest and drink plenty of fluids. °· Do not take baths, swim, or use a hot tub until your doctor says it is okay to do so. You may shower 24-48 hours after the procedure or as told by your doctor. °· If the insertion area starts to bleed, lie flat and put pressure on the area. If the bleeding does not stop, get help right away. This is an emergency. °This information is not intended to replace advice given to you by your health care provider. Make sure you discuss any questions you have with your health care  provider. °Document Released: 04/29/2008 Document Revised: 01/13/2017 Document Reviewed: 01/26/2016 °Elsevier Patient Education © 2020 [employer]. ° °

## 2018-09-12 NOTE — H&P (Signed)
[facility] History & Physical Update  The patient was interviewed and re-examined.  The patient's previous History and Physical has been reviewed and is unchanged.  There is no change in the plan of care. We plan to proceed with the scheduled procedure.  Hortencia Pilar, MD  09/12/2018, 8:07 AM

## 2018-09-12 NOTE — Op Note (Signed)
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  Introduction catheter into left lower extremity 3rd order catheter placement from right femoral approach 2. Introduction catheter into left lower extremity third order catheter placement from left ankle approach  3.  Ultrasound-guided access to the right common femoral artery and left posterior tibial artery artery             4.   Crosser atherectomy with percutaneous transluminal angioplasty and stent placement left superficial femoral and popliteal arteries 4. Percutaneous transluminal angioplasty left proximal posterior tibial and tibial peroneal trunk  5. Percutaneous transluminal angioplasty left external iliac artery secondary to in-stent restenosis             6.  Star close closure right common femoral arteriotomy and manual pressure held left posterior tibial at the ankle  Anesthesia: Conscious sedation was administered under my direct supervision by the interventional radiology RN. IV Versed plus fentanyl were utilized. Continuous ECG, pulse oximetry and blood pressure was monitored throughout the entire procedure.  Conscious sedation was for a total of 220 minutes.  Sheath: 5 French 11 cm slender sheath retrograde left posterior tibial artery; 7 [place] Ansell retrograde right common femoral artery  Contrast: 110 cc  Fluoroscopy Time: 44.9 minutes  Indications: Dylan Villanueva presents with increasing pain of the left lower extremity.  He has had 2 attempts at intervention.  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  Given his worsening rest pain this suggests the patient is having limb threatening ischemia. The risks and benefits are reviewed all questions answered patient agrees to proceed with angiography and intervention including retrograde access from the pedal approach.  Procedure:Dylan Villanueva is a 78 y.o. y.o. male who was identified and appropriate procedural time out was performed. The patient was then placed supine on the table and prepped and draped in the usual sterile fashion for right groin access as well as left ankle access.   Ultrasound was placed in a sterile sleeve.  Working at the ankle level the posterior tibial artery was identified it was echolucent and noncompressible with very mild pulsatility noted with compression indicating it was patent.  Images recorded for the permanent record.  1% lidocaine is infiltrated in the soft tissues and then a micropuncture kit was used to access the posterior tibial artery.  Microwire was advanced without difficulty followed by a micro-sheath.  035 wire was then advanced under fluoroscopic guidance and a 5 French slender sheath is placed without difficulty.  Sheath aspirates and flushes easily.  At this point I gave 3000 units of heparin.  Hand-injection contrast demonstrated the tibial anatomy in a retrograde fashion including a greater than 60% ostial posterior tibial lesion associated with a greater than 70% tibioperoneal trunk lesion and then diffuse hemodynamically significant disease in the distal popliteal up to an occlusion of the mid popliteal.  Using a combination of a Kumpe catheter and a straight glide catheter as well as advantage wires and a 0.014 when wire I was able to negotiate the catheter up to the popliteal occlusion.  At this point wires and catheters were not advancing and I elected to use the 14 S crosser catheter.  In order to allow the Crosser  to move more freely I advanced a 3 mm Ultraverse balloon across the  proximal posterior tibial tibioperoneal trunk and the distal popliteal.  Inflation was to 12 atm for 1 minute.  Follow-up imaging demonstrated less than 10% residual stenosis within the posterior tibial and tibioperoneal trunk.  I then advanced the Crosser catheter and was able to negotiate the popliteal occlusion.  I then continued with the Crosser catheter up to the level of Hunter's canal where I was able to reenter the native artery with the advantage wire.  Hand-injection contrast demonstrated the patent distal SFA/above-knee popliteal as seen in the previous angiogram.  It also again confirmed the occlusion of the SFA from its origin down to Hunter's canal.  I then continued to negotiate the SFA occlusion using combination of the Crosser catheter as well as Kumpe and seeker catheters and advantage wire.  Ultimately I was able to regain intraluminal wire position within the common femoral which was confirmed by hand-injection.  It should be noted that during this procedure multiple boluses of heparin were given at various time points totaling 10,000 units of heparin administered throughout the entire case.  Having successfully crossed the SFA occlusion I now addressed gaining access from the right common femoral.  Ultrasound was returned to the field in a sterile sleeve.  The right common femoral artery was identified by ultrasound.  1% lidocaine was infiltrated in the soft tissues.  A micropuncture needle was then used to access the right common femoral artery.  Image was recorded for the permanent record.  Microwire followed by a micro-sheath was then inserted.  A 0.035 wire was then advanced in a retrograde fashion through the right iliac arteries and a rim catheter was used to cross the aortic bifurcation.  A 7 [place] Ansell sheath was then selected and advanced up and over the bifurcation.  However I could not get the Ansell sheath to cross the previously placed left external iliac artery stent.   Hand-injection of contrast demonstrated a greater than 60% stenosis within the distal one third of the stent.  Having crossed this with the wire I advanced an 8 mm x 60 mm Lutonix drug-eluting balloon across this lesion balloon inflation was to 12 atm for 1 minute.  Follow-up imaging demonstrated less than 20% residual stenosis and I was now able to easily cross the stent with the Ansell sheath which was positioned with its tip in the proximal common femoral artery.  An EN-snare was then opened in the common femoral proximally.  A V 18 wire was then advanced through the seeker catheter from the ankle V 18 wire was snared and then delivered out the 7 [place] Ansell sheath.  We now had wire access from the right groin down through the left ankle.  I was not able to get the seeker catheter to track in the antegrade direction down the left leg and therefore the seeker catheter was advanced from the ankle up and out the femoral sheath.  The existing V 18 wire was then exchanged for a new V 18 wire which was now positioned with its floppy tip extending out the 5 French sheath at the ankle level.  The SFA was now addressed.  Initially a 3 mm x 300 mm ultra versed balloon was used angioplasty the entire length of the SFA.  Next a 4 mm balloon was used to angioplasty the SFA.  Follow-up imaging demonstrated numerous areas of greater than 80% residual stenosis and 6 mm life stents were  then deployed from the level of Hunter's canal proximally up to the origin of the SFA.  The most proximal stent did flower out extending partially over profunda femoris ostia.  Given this finding I advanced a 0.018 advantage wire from the femoral sheath out into the profunda femoris profunda femoris was selected with the aid of a Kumpe catheter.  A 6 mm x 30 mm Lutonix drug-eluting balloon was then positioned at the origin of the profunda and a 5 mm x 220 mm Lutonix drug-eluting balloon was positioned across the SFA.  Both balloons were  inflated simultaneously to 10 atm for 2 minutes.  I then contemplated placing kissing stents across the profunda femoris and the superficial femoral however the SFA stent appeared to have splayed open and did not appear to be obstructing flow and therefore I elected not to place a stent in the profunda femoris.  The remaining portion of the SFA stents were then postdilated with a 5 mm Lutonix drug-eluting balloons inflated to 10 to 12 atm for 1 minute.  Follow-up imaging now demonstrated patency with less than 20% residual stenosis throughout the SFA and above-knee popliteal.  The mid popliteal still demonstrated high-grade subtotal occlusion this was treated with a 4 mm balloon inflation but this did not improve the stricture significantly and therefore a 5 mm x 60 mm life stent was deployed and postdilated with a 4 mm x 80 mm Lutonix drug-eluting balloon.  Hand-injection of contrast now demonstrated inline flow from the common femoral down to the trifurcation.  I once again imaged the tibioperoneal trunk and posterior tibial.  Both areas that have been treated with a 3 mm balloon appeared to be widely patent with less than 10% residual stenosis and there was now direct flow down to the level of the sheath at the ankle.  Previous angiogram demonstrated the distal one third of the posterior tibial was widely patent and free of any hemodynamically significant disease.  At this point I elected to terminate the procedure as we now had achieved revascularization of the SFA popliteal and tibial vessels as well as the external iliac.  After review of these images the sheath is pulled into the right external iliac oblique of the common femoral is obtained and a Star close device deployed. There no immediate complications.  The 5 French slender sheath in the ankle was pulled and pressure was held until hemostasis was achieved.  Findings:  As noted above the initial findings were occlusion of the SFA throughout its  course as well as occlusion of the mid popliteal.  There was greater than 60% stenosis of the tibioperoneal trunk and ostia of the posterior tibial.  There was also a greater than 60% stenosis of the iliac stent that had been placed previously.  Following crosser atherectomy of the popliteal and distal SFA there is successful intraluminal positioning of the wire following angioplasty of the external iliac there is now wide patency of the iliac stent.  Following angioplasty and stent placement of the SFA and popliteal there is now wide patency of these 2 arteries and following the initial angioplasty in the tibial peroneal trunk and posterior tibial there is now wide patency of the tibial vessels.  Summary: Successful recanalization left lower extremity for limb salvage   Disposition: Patient was taken to the recovery room in stable condition having tolerated the procedure well.  Belenda Cruise Schnier 09/12/2018,1:19 PM

## 2018-09-12 NOTE — Progress Notes (Addendum)
Patient arrived to the recovery area with a baer hugger in place as patient was shivering post procedure due to being cold. Temp was checked, it was 97.44f oral.   Dr. Delana Meyer was informed of asymptomatic BP 95/54, no new orders. Light compress was applied to the right pedal area due to slight hematoma, Dr. Delana Meyer okay with this intervention.

## 2018-09-12 NOTE — Progress Notes (Signed)
Patient was found at 13:30 assessment to have pulsatile bleeding to the right groin. Pressure was immediately applied and held for 30 mins. Marcelle Overlie, PA at bedside injected lidocaine, and applied skin glue. Per Maudie Mercury PAD with 40 ml of air should also be applied to the site. Patient tolerated well.

## 2018-09-13 ENCOUNTER — Encounter: Payer: Self-pay | Admitting: Vascular Surgery

## 2018-09-14 ENCOUNTER — Telehealth (INDEPENDENT_AMBULATORY_CARE_PROVIDER_SITE_OTHER): Payer: Self-pay

## 2018-09-14 NOTE — Telephone Encounter (Signed)
Swelling and some discomfort is normal especially when extensive work has been done to the leg.  He should wear compression socks, elevate as much as possible and take ibuprofen for the pain. He can take 800 mg every 6-8 hours.  We can have him come in next week with ABIs and see me or Dr. Delana Meyer

## 2018-09-17 ENCOUNTER — Other Ambulatory Visit (INDEPENDENT_AMBULATORY_CARE_PROVIDER_SITE_OTHER): Payer: Self-pay | Admitting: Nurse Practitioner

## 2018-09-17 DIAGNOSIS — Z95820 Peripheral vascular angioplasty status with implants and grafts: Secondary | ICD-10-CM

## 2018-09-17 DIAGNOSIS — M79605 Pain in left leg: Secondary | ICD-10-CM

## 2018-09-17 DIAGNOSIS — I70222 Atherosclerosis of native arteries of extremities with rest pain, left leg: Secondary | ICD-10-CM

## 2018-09-18 ENCOUNTER — Ambulatory Visit (INDEPENDENT_AMBULATORY_CARE_PROVIDER_SITE_OTHER): Payer: Medicare Other | Admitting: Nurse Practitioner

## 2018-09-18 ENCOUNTER — Other Ambulatory Visit: Payer: Self-pay

## 2018-09-18 ENCOUNTER — Encounter (INDEPENDENT_AMBULATORY_CARE_PROVIDER_SITE_OTHER): Payer: Self-pay | Admitting: Nurse Practitioner

## 2018-09-18 ENCOUNTER — Ambulatory Visit (INDEPENDENT_AMBULATORY_CARE_PROVIDER_SITE_OTHER): Payer: Medicare Other

## 2018-09-18 VITALS — BP 126/66 | HR 82 | Resp 16 | Wt 141.0 lb

## 2018-09-18 DIAGNOSIS — E782 Mixed hyperlipidemia: Secondary | ICD-10-CM | POA: Diagnosis not present

## 2018-09-18 DIAGNOSIS — F17200 Nicotine dependence, unspecified, uncomplicated: Secondary | ICD-10-CM | POA: Diagnosis not present

## 2018-09-18 DIAGNOSIS — Z95820 Peripheral vascular angioplasty status with implants and grafts: Secondary | ICD-10-CM

## 2018-09-18 DIAGNOSIS — M79605 Pain in left leg: Secondary | ICD-10-CM

## 2018-09-18 DIAGNOSIS — I70223 Atherosclerosis of native arteries of extremities with rest pain, bilateral legs: Secondary | ICD-10-CM | POA: Diagnosis not present

## 2018-09-18 DIAGNOSIS — M7989 Other specified soft tissue disorders: Secondary | ICD-10-CM

## 2018-09-18 DIAGNOSIS — I70222 Atherosclerosis of native arteries of extremities with rest pain, left leg: Secondary | ICD-10-CM

## 2018-09-18 NOTE — Progress Notes (Signed)
SUBJECTIVE:  Patient ID: Dylan Villanueva, male    DOB: [birth year], 78 y.o.   MRN: [id number] Chief Complaint  Patient presents with  . Follow-up    HPI  Dylan Villanueva is a 78 y.o. male that presents today with concerns about swelling of his left lower extremity.  The patient recently underwent angiogram of the left lower extremity on 09/12/2018.  There was a left angioplasty of the deep profunda as well as of the SFA and external iliac artery.  The left SFA also received a stent with angioplasty of the TP trunk.  The patient stated that initially after the procedure he continued to have pain in his calf however that has subsided.  He is more concerning issue right now the swelling of his left lower extremity.  The patient has attempted to elevate his lower extremities somewhat however that has not relieve the swelling.  He denies any fever, chills, nausea, vomiting or diarrhea.  He denies any chest pain or shortness of breath.  He denies any TIA-like symptoms.  Today the patient underwent ABIs which reveals right ABI of 1.02.  The left is 0.99.  Previously on studies done on 08/30/2018 the right ABI was 0.94 with the left at 0.54.  The tibial artery waveforms in the lower right lower extremity are triphasic with biphasic waveforms in the left.  Past Medical History:  Diagnosis Date  . Cancer [facility]) 2006   prostate   . Colon polyps   . Hyperlipidemia   . Hypertension   . Myocardial infarction ([place])   . Prostate CA ([place])   . Smoker   . Squamous acanthoma of external ear, right     Past Surgical History:  Procedure Laterality Date  . APPENDECTOMY  1980  . CARDIAC SURGERY    . COLONOSCOPY WITH PROPOFOL N/A 03/02/2018   Procedure: COLONOSCOPY WITH PROPOFOL;  Surgeon: [name], MD;  Location: [place];  Service: Endoscopy;  Laterality: N/A;  . CORONARY ANGIOPLASTY WITH STENT PLACEMENT    . ESOPHAGOGASTRODUODENOSCOPY N/A 11/07/2017   Procedure: ESOPHAGOGASTRODUODENOSCOPY (EGD);   Surgeon: Jules Husbands, MD;  Location: [facility];  Service: General;  Laterality: N/A;  . ESOPHAGOGASTRODUODENOSCOPY (EGD) WITH PROPOFOL  03/02/2018   Procedure: ESOPHAGOGASTRODUODENOSCOPY (EGD) WITH PROPOFOL;  Surgeon: [name], MD;  Location: [place];  Service: Endoscopy;;  . EUS N/A 07/13/2017   Procedure: FULL UPPER ENDOSCOPIC ULTRASOUND (EUS) RADIAL;  Surgeon: Jola Schmidt, MD;  Location: [facility];  Service: Endoscopy;  Laterality: N/A;  . HIGH DOSE RADIATION IMPLANT INSERTION  2006  . LAPAROSCOPIC REMOVAL ABDOMINAL MASS N/A 11/07/2017   Procedure: LAPAROSCOPIC GASTRIC MASS RESECTION;  Surgeon: Jules Husbands, MD;  Location: [facility];  Service: General;  Laterality: N/A;  . LOWER EXTREMITY ANGIOGRAPHY Left 07/04/2018   Procedure: LOWER EXTREMITY ANGIOGRAPHY;  Surgeon: Katha Cabal, MD;  Location: [place] [facility];  Service: Cardiovascular;  Laterality: Left;  . LOWER EXTREMITY ANGIOGRAPHY Left 08/10/2018   Procedure: LOWER EXTREMITY ANGIOGRAPHY;  Surgeon: Katha Cabal, MD;  Location: [place] [facility];  Service: Cardiovascular;  Laterality: Left;  . LOWER EXTREMITY ANGIOGRAPHY Left 09/12/2018   Procedure: LOWER EXTREMITY ANGIOGRAPHY;  Surgeon: Katha Cabal, MD;  Location: [place] [facility];  Service: Cardiovascular;  Laterality: Left;  . POLYPECTOMY  03/02/2018   Procedure: POLYPECTOMY;  Surgeon: [name], MD;  Location: [place];  Service: Endoscopy;;    Social History   Socioeconomic History  . Marital status: Married  Spouse name: Jana Half  . Number of children: 2  . Years of education: Not on file  . Highest education level: Bachelor's degree (e.g., BA, AB, BS)  Occupational History  . Occupation: retired   Scientific laboratory technician  . Financial resource strain: Not hard at all  . Food insecurity    Worry: Never true    Inability: Never true  . Transportation needs    Medical: No    Non-medical: No  Tobacco Use  . Smoking  status: Current Every Day Smoker    Packs/day: 1.00    Years: 66.00    Pack years: 66.00    Types: Cigarettes  . Smokeless tobacco: Never Used  . Tobacco comment:      Substance and Sexual Activity  . Alcohol use: Yes    Alcohol/week: 3.0 standard drinks    Types: 3 Cans of beer per week  . Drug use: Never  . Sexual activity: Not Currently  Lifestyle  . Physical activity    Days per week: 0 days    Minutes per session: 0 min  . Stress: Not at all  Relationships  . Social connections    Talks on phone: More than three times a week    Gets together: Three times a week    Attends religious service: Never    Active member of club or organization: No    Attends meetings of clubs or organizations: Never    Relationship status: Married  . Intimate partner violence    Fear of current or ex partner: No    Emotionally abused: No    Physically abused: No    Forced sexual activity: No  Other Topics Concern  . Not on file  Social History Narrative  . Not on file    Family History  Problem Relation Age of Onset  . Cancer Mother        pancreatic  . Dementia Father   . Cancer Sister        breast   . Aneurysm Brother     Allergies  Allergen Reactions  . Penicillins Nausea Only    Has patient had a PCN reaction causing immediate rash, facial/tongue/throat swelling, SOB or lightheadedness with hypotension: no Has patient had a PCN reaction causing severe rash involving mucus membranes or skin necrosis: no Has patient had a PCN reaction that required hospitalization: no Has patient had a PCN reaction occurring within the last 10 years: no If all of the above answers are "NO", then may proceed with Cephalosporin use.      Review of Systems   Review of Systems: Negative Unless Checked Constitutional: [] Weight loss  [] Fever  [] Chills Cardiac: [] Chest pain   []  Atrial Fibrillation  [] Palpitations   [] Shortness of breath when laying flat   [] Shortness of breath with exertion.  [] Shortness of breath at rest Vascular:  [] Pain in legs with walking   [] Pain in legs with standing [] Pain in legs when laying flat   [] Claudication    [] Pain in feet when laying flat    [] History of DVT   [] Phlebitis   [x] Swelling in legs   [x] Varicose veins   [] Non-healing ulcers Pulmonary:   [] Uses home oxygen   [] Productive cough   [] Hemoptysis   [] Wheeze  [] COPD   [] Asthma Neurologic:  [] Dizziness   [] Seizures  [] Blackouts [] History of stroke   [] History of TIA  [] Aphasia   [] Temporary Blindness   [] Weakness or numbness in arm   [] Weakness or numbness in leg Musculoskeletal:   []   Joint swelling   [] Joint pain   [] Low back pain  []  History of Knee Replacement [] Arthritis [] back Surgeries  []  Spinal Stenosis    Hematologic:  [] Easy bruising  [] Easy bleeding   [] Hypercoagulable state   [] Anemic Gastrointestinal:  [] Diarrhea   [] Vomiting  [] Gastroesophageal reflux/heartburn   [] Difficulty swallowing. [] Abdominal pain Genitourinary:  [] Chronic kidney disease   [] Difficult urination  [] Anuric   [] Blood in urine [] Frequent urination  [] Burning with urination   [] Hematuria Skin:  [] Rashes   [] Ulcers [] Wounds Psychological:  [] History of anxiety   []  History of major depression  []  Memory Difficulties      OBJECTIVE:   Physical Exam  BP 126/66 (BP Location: Right Arm)   Pulse 82   Resp 16   Wt 141 lb (64 kg)   BMI 22.08 kg/m   Gen: WD/WN, NAD Head: [place]/AT, No temporalis wasting.  Ear/Nose/Throat: Hearing grossly intact, nares w/o erythema or drainage Eyes: PER, EOMI, sclera nonicteric.  Neck: Supple, no masses.  No JVD.  Pulmonary:  Good air movement, no use of accessory muscles.  Cardiac: RRR Vascular:  Unable to palpate pulses due to edema, however foot is warm with no discoloration.  Left lower extremity has 3+ pitting edema Vessel Right Left  Radial Palpable Palpable   Gastrointestinal: soft, non-distended. No guarding/no peritoneal signs.  Musculoskeletal: M/S 5/5 throughout.  No  deformity or atrophy.  Neurologic: Pain and light touch intact in extremities.  Symmetrical.  Speech is fluent. Motor exam as listed above. Psychiatric: Judgment intact, Mood & affect appropriate for pt's clinical situation. Dermatologic:  Small blister on left foot. No changes consistent with cellulitis. Lymph : No Cervical lymphadenopathy, no lichenification or skin changes of chronic lymphedema.       ASSESSMENT AND PLAN:  1. Atherosclerosis of native artery of both lower extremities with rest pain ([place]) Currently the patient's rest pain has resolved at this point.  ABIs are markedly improved.  We will have the patient return in several weeks to evaluate the swelling over the lower extremity as well as to undergo a left lower extremity arterial duplex.  Patient is instructed to contact our office if the swelling persist or if he begins to have wounds on his lower extremities  2. Hyperlipidemia, mixed Continue statin as ordered and reviewed, no changes at this time   3. Current every day smoker Smoking cessation was discussed, 3-10 minutes spent on this topic specifically   4. Swelling of lower leg The swelling in his lower extremity is due to reperfusion following his angiogram.  I advised the patient that he can wear medical grade 1 compression stockings to help with the swelling as well as elevate and exercise.  I also offered the patient the opportunity to be placed in [place] wraps however at this time the patient does not wish to go with that method.   Current Outpatient Medications on File Prior to Visit  Medication Sig Dispense Refill  . amLODipine (NORVASC) 10 MG tablet Take 1 tablet (10 mg total) by mouth daily. 90 tablet 1  . apixaban (ELIQUIS) 5 MG TABS tablet Take 1 tablet (5 mg total) by mouth 2 (two) times daily. 60 tablet 4  . atorvastatin (LIPITOR) 40 MG tablet Take 1 tablet (40 mg total) by mouth daily. 90 tablet 1  . cilostazol (PLETAL) 100 MG tablet TAKE 1 TABLET(100  MG) BY MOUTH TWICE DAILY (Patient taking differently: Take 100 mg by mouth 2 (two) times daily. ) 180 tablet 0  .  clopidogrel (PLAVIX) 75 MG tablet TAKE 1 TABLET(75 MG) BY MOUTH DAILY (Patient taking differently: Take 75 mg by mouth daily. TAKE 1 TABLET(75 MG) BY MOUTH DAILY) 30 tablet 0  . lisinopril (ZESTRIL) 5 MG tablet Take 1 tablet (5 mg total) by mouth daily. 90 tablet 1  . metoCLOPramide (REGLAN) 10 MG tablet Take 1 tablet (10 mg total) by mouth 3 (three) times daily. 60 tablet 1  . Multiple Vitamins-Minerals (MULTIVITAMIN ADULT PO) Take 1 tablet by mouth daily.     . Vitamin D, Cholecalciferol, 1000 units CAPS Take 1 capsule by mouth daily. 60 capsule 3   No current facility-administered medications on file prior to visit.     There are no Patient Instructions on file for this visit. No follow-ups on file.   Kris Hartmann, NP  This note was completed with Sales executive.  Any errors are purely unintentional.

## 2018-10-01 ENCOUNTER — Other Ambulatory Visit: Payer: Self-pay

## 2018-10-01 ENCOUNTER — Ambulatory Visit (INDEPENDENT_AMBULATORY_CARE_PROVIDER_SITE_OTHER): Payer: Medicare Other | Admitting: Vascular Surgery

## 2018-10-01 ENCOUNTER — Ambulatory Visit (INDEPENDENT_AMBULATORY_CARE_PROVIDER_SITE_OTHER): Payer: Medicare Other

## 2018-10-01 ENCOUNTER — Other Ambulatory Visit (INDEPENDENT_AMBULATORY_CARE_PROVIDER_SITE_OTHER): Payer: Self-pay | Admitting: Nurse Practitioner

## 2018-10-01 ENCOUNTER — Encounter (INDEPENDENT_AMBULATORY_CARE_PROVIDER_SITE_OTHER): Payer: Self-pay

## 2018-10-01 ENCOUNTER — Encounter (INDEPENDENT_AMBULATORY_CARE_PROVIDER_SITE_OTHER): Payer: Self-pay | Admitting: Vascular Surgery

## 2018-10-01 ENCOUNTER — Encounter (INDEPENDENT_AMBULATORY_CARE_PROVIDER_SITE_OTHER): Payer: Medicare Other

## 2018-10-01 VITALS — BP 113/68 | HR 83 | Resp 10 | Ht 67.0 in | Wt 141.0 lb

## 2018-10-01 DIAGNOSIS — I70213 Atherosclerosis of native arteries of extremities with intermittent claudication, bilateral legs: Secondary | ICD-10-CM

## 2018-10-01 DIAGNOSIS — I70222 Atherosclerosis of native arteries of extremities with rest pain, left leg: Secondary | ICD-10-CM

## 2018-10-01 DIAGNOSIS — I2510 Atherosclerotic heart disease of native coronary artery without angina pectoris: Secondary | ICD-10-CM

## 2018-10-01 DIAGNOSIS — I6523 Occlusion and stenosis of bilateral carotid arteries: Secondary | ICD-10-CM | POA: Diagnosis not present

## 2018-10-01 DIAGNOSIS — Z95820 Peripheral vascular angioplasty status with implants and grafts: Secondary | ICD-10-CM

## 2018-10-01 DIAGNOSIS — Z955 Presence of coronary angioplasty implant and graft: Secondary | ICD-10-CM | POA: Diagnosis not present

## 2018-10-01 DIAGNOSIS — I10 Essential (primary) hypertension: Secondary | ICD-10-CM | POA: Diagnosis not present

## 2018-10-01 DIAGNOSIS — E782 Mixed hyperlipidemia: Secondary | ICD-10-CM | POA: Diagnosis not present

## 2018-10-01 NOTE — Progress Notes (Signed)
MRN : [id number]  Dylan Villanueva is a 78 y.o. ([birth year]) male who presents with chief complaint of  Chief Complaint  Patient presents with  . Follow-up  .  History of Present Illness:  The patient returns to the office for followup and review status post angiogram with intervention on 09/12/2018.   Procedure(s) Performed: 1.  Introduction catheter into left lower extremity 3rd order catheter placement from right femoral approach 2. Introduction catheter into left lower extremity third order catheter placement from left ankle approach  3.  Ultrasound-guided access to the right common femoral artery and left posterior tibial artery artery             4.   Crosser atherectomy with percutaneous transluminal angioplasty and stent placement left superficial femoral and popliteal arteries 4. Percutaneous transluminal angioplasty left proximal posterior tibial and tibial peroneal trunk  5. Percutaneous transluminal angioplasty left external iliac artery secondary to in-stent restenosis             6.  Star close closure right common femoral arteriotomy and manual pressure held left posterior tibial at the ankle  The patient notes improvement in the lower extremity symptoms. No interval shortening of the patient's claudication distance or rest pain symptoms. No new ulcers or wounds have occurred since the last visit.  There have been no significant changes to the patient's overall health care.  The patient denies amaurosis fugax or recent TIA symptoms. There are no recent neurological changes noted. The patient denies history of DVT, PE or superficial thrombophlebitis. The patient denies recent episodes of angina or shortness of breath.   ABI's Rt=1.02 and Lt=0.99  (previous ABI's Rt=0.94 and Lt=0.54)   Current Meds  Medication Sig  . amLODipine (NORVASC) 10 MG tablet Take 1 tablet (10 mg total) by mouth daily.  Marland Kitchen apixaban  (ELIQUIS) 5 MG TABS tablet Take 1 tablet (5 mg total) by mouth 2 (two) times daily.  Marland Kitchen atorvastatin (LIPITOR) 40 MG tablet Take 1 tablet (40 mg total) by mouth daily.  . cilostazol (PLETAL) 100 MG tablet TAKE 1 TABLET(100 MG) BY MOUTH TWICE DAILY (Patient taking differently: Take 100 mg by mouth 2 (two) times daily. )  . clopidogrel (PLAVIX) 75 MG tablet TAKE 1 TABLET(75 MG) BY MOUTH DAILY (Patient taking differently: Take 75 mg by mouth daily. TAKE 1 TABLET(75 MG) BY MOUTH DAILY)  . lisinopril (ZESTRIL) 5 MG tablet Take 1 tablet (5 mg total) by mouth daily.  . metoCLOPramide (REGLAN) 10 MG tablet Take 1 tablet (10 mg total) by mouth 3 (three) times daily.  . Multiple Vitamins-Minerals (MULTIVITAMIN ADULT PO) Take 1 tablet by mouth daily.   . Vitamin D, Cholecalciferol, 1000 units CAPS Take 1 capsule by mouth daily.    Past Medical History:  Diagnosis Date  . Cancer [facility]) 2006   prostate   . Colon polyps   . Hyperlipidemia   . Hypertension   . Myocardial infarction ([place])   . Prostate CA ([place])   . Smoker   . Squamous acanthoma of external ear, right     Past Surgical History:  Procedure Laterality Date  . APPENDECTOMY  1980  . CARDIAC SURGERY    . COLONOSCOPY WITH PROPOFOL N/A 03/02/2018   Procedure: COLONOSCOPY WITH PROPOFOL;  Surgeon: [name], MD;  Location: [place];  Service: Endoscopy;  Laterality: N/A;  . CORONARY ANGIOPLASTY WITH STENT PLACEMENT    . ESOPHAGOGASTRODUODENOSCOPY N/A 11/07/2017   Procedure: ESOPHAGOGASTRODUODENOSCOPY (EGD);  Surgeon: Jules Husbands,  MD;  Location: [facility];  Service: General;  Laterality: N/A;  . ESOPHAGOGASTRODUODENOSCOPY (EGD) WITH PROPOFOL  03/02/2018   Procedure: ESOPHAGOGASTRODUODENOSCOPY (EGD) WITH PROPOFOL;  Surgeon: [name], MD;  Location: [place];  Service: Endoscopy;;  . EUS N/A 07/13/2017   Procedure: FULL UPPER ENDOSCOPIC ULTRASOUND (EUS) RADIAL;  Surgeon: Jola Schmidt, MD;  Location: [facility];   Service: Endoscopy;  Laterality: N/A;  . HIGH DOSE RADIATION IMPLANT INSERTION  2006  . LAPAROSCOPIC REMOVAL ABDOMINAL MASS N/A 11/07/2017   Procedure: LAPAROSCOPIC GASTRIC MASS RESECTION;  Surgeon: Jules Husbands, MD;  Location: [facility];  Service: General;  Laterality: N/A;  . LOWER EXTREMITY ANGIOGRAPHY Left 07/04/2018   Procedure: LOWER EXTREMITY ANGIOGRAPHY;  Surgeon: Katha Cabal, MD;  Location: [place] [facility];  Service: Cardiovascular;  Laterality: Left;  . LOWER EXTREMITY ANGIOGRAPHY Left 08/10/2018   Procedure: LOWER EXTREMITY ANGIOGRAPHY;  Surgeon: Katha Cabal, MD;  Location: [place] [facility];  Service: Cardiovascular;  Laterality: Left;  . LOWER EXTREMITY ANGIOGRAPHY Left 09/12/2018   Procedure: LOWER EXTREMITY ANGIOGRAPHY;  Surgeon: Katha Cabal, MD;  Location: [place] [facility];  Service: Cardiovascular;  Laterality: Left;  . POLYPECTOMY  03/02/2018   Procedure: POLYPECTOMY;  Surgeon: [name], MD;  [facility];  Service: Endoscopy;;    Social History Social History   Tobacco Use  . Smoking status: Current Every Day Smoker    Packs/day: 1.00    Years: 66.00    Pack years: 66.00    Types: Cigarettes  . Smokeless tobacco: Never Used  . Tobacco comment:      Substance Use Topics  . Alcohol use: Yes    Alcohol/week: 3.0 standard drinks    Types: 3 Cans of beer per week  . Drug use: Never    Family History Family History  Problem Relation Age of Onset  . Cancer Mother        pancreatic  . Dementia Father   . Cancer Sister        breast   . Aneurysm Brother     Allergies  Allergen Reactions  . Penicillins Nausea Only    Has patient had a PCN reaction causing immediate rash, facial/tongue/throat swelling, SOB or lightheadedness with hypotension: no Has patient had a PCN reaction causing severe rash involving mucus membranes or skin necrosis: no Has patient had a PCN reaction that required hospitalization: no Has  patient had a PCN reaction occurring within the last 10 years: no If all of the above answers are "NO", then may proceed with Cephalosporin use.      REVIEW OF SYSTEMS (Negative unless checked)  Constitutional: [] Weight loss  [] Fever  [] Chills Cardiac: [] Chest pain   [] Chest pressure   [] Palpitations   [] Shortness of breath when laying flat   [] Shortness of breath with exertion. Vascular:  [x] Pain in legs with walking   [] Pain in legs at rest  [] History of DVT   [] Phlebitis   [] Swelling in legs   [] Varicose veins   [] Non-healing ulcers Pulmonary:   [] Uses home oxygen   [] Productive cough   [] Hemoptysis   [] Wheeze  [] COPD   [] Asthma Neurologic:  [] Dizziness   [] Seizures   [] History of stroke   [] History of TIA  [] Aphasia   [] Vissual changes   [] Weakness or numbness in arm   [] Weakness or numbness in leg Musculoskeletal:   [] Joint swelling   [] Joint pain   [] Low back pain Hematologic:  [] Easy bruising  [] Easy bleeding   []   Hypercoagulable state   [] Anemic Gastrointestinal:  [] Diarrhea   [] Vomiting  [] Gastroesophageal reflux/heartburn   [] Difficulty swallowing. Genitourinary:  [] Chronic kidney disease   [] Difficult urination  [] Frequent urination   [] Blood in urine Skin:  [] Rashes   [] Ulcers  Psychological:  [] History of anxiety   []  History of major depression.  Physical Examination  Vitals:   10/01/18 1412  BP: 113/68  Pulse: 83  Resp: 10  Weight: 141 lb (64 kg)  Height: 5\' 7"  (1.702 m)   Body mass index is 22.08 kg/m. Gen: WD/WN, NAD Head: /AT, No temporalis wasting.  Ear/Nose/Throat: Hearing grossly intact, nares w/o erythema or drainage Eyes: PER, EOMI, sclera nonicteric.  Neck: Supple, no large masses.   Pulmonary:  Good air movement, no audible wheezing bilaterally, no use of accessory muscles.  Cardiac: RRR, no JVD Vascular:  Venous stasis changes with redness to the leg left.  3-4+ soft pitting edema Vessel Right Left  PT Palpable Trace Palpable  DP Palpable 1+  Palpable  Gastrointestinal: Non-distended. No guarding/no peritoneal signs.  Musculoskeletal: M/S 5/5 throughout.  No deformity or atrophy.  Neurologic: CN 2-12 intact. Symmetrical.  Speech is fluent. Motor exam as listed above. Psychiatric: Judgment intact, Mood & affect appropriate for pt's clinical situation. Dermatologic: venous rashes no ulcers noted.  No changes consistent with cellulitis. Lymph : No lichenification or skin changes of chronic lymphedema.  CBC Lab Results  Component Value Date   WBC 7.3 03/06/2018   HGB 13.8 03/06/2018   HCT 40.8 03/06/2018   MCV 100.7 (H) 03/06/2018   PLT 241 03/06/2018    BMET    Component Value Date/Time   NA 140 03/06/2018 0900   NA 142 05/08/2017 1142   K 4.0 03/06/2018 0900   CL 105 03/06/2018 0900   CO2 28 03/06/2018 0900   GLUCOSE 126 (H) 03/06/2018 0900   BUN 17 09/12/2018 0714   BUN 15 05/08/2017 1142   CREATININE 0.87 09/12/2018 0714   CALCIUM 9.0 03/06/2018 0900   GFRNONAA >60 09/12/2018 0714   GFRAA >60 09/12/2018 0714   Estimated Creatinine Clearance: 62.3 mL/min (by C-G formula based on SCr of 0.87 mg/dL).  COAG Lab Results  Component Value Date   INR 0.98 11/02/2017    Radiology No results found.   Assessment/Plan 1. Atherosclerosis of native artery of both lower extremities with intermittent claudication (HCC) Recommend:  The patient is status post successful angiogram with intervention.  The patient reports that the claudication symptoms and leg pain is essentially gone.   The patient denies lifestyle limiting changes at this point in time.  No further invasive studies, angiography or surgery at this time The patient should continue walking and begin a more formal exercise program.  The patient should continue antiplatelet therapy and aggressive treatment of the lipid abnormalities  Smoking cessation was again discussed  The patient should continue wearing graduated compression socks 10-15 mmHg strength  to control the moderate edema edema.  Patient should undergo noninvasive studies as ordered. The patient will follow up with me after the studies.   - VAS [place] LOWER EXTREMITY ARTERIAL DUPLEX; Future - VAS [place] ABI WITH/WO TBI; Future  2. Bilateral carotid artery stenosis Recommend:  Given the patient's asymptomatic subcritical stenosis no further invasive testing or surgery at this time.  Continue antiplatelet therapy as prescribed Continue management of CAD, HTN and Hyperlipidemia Healthy heart diet,  encouraged exercise at least 4 times per week Follow up in approximately 6 months with duplex ultrasound and physical  exam   3. Presence of stent in coronary artery in patient with coronary artery disease Continue cardiac and antihypertensive medications as already ordered and reviewed, no changes at this time.  Continue statin as ordered and reviewed, no changes at this time  Nitrates PRN for chest pain   4. Essential hypertension Continue antihypertensive medications as already ordered, these medications have been reviewed and there are no changes at this time.   5. Hyperlipidemia, mixed Continue statin as ordered and reviewed, no changes at this time    Hortencia Pilar, MD  10/01/2018 3:37 PM

## 2018-10-08 ENCOUNTER — Encounter (INDEPENDENT_AMBULATORY_CARE_PROVIDER_SITE_OTHER): Payer: Medicare Other

## 2018-10-08 ENCOUNTER — Ambulatory Visit (INDEPENDENT_AMBULATORY_CARE_PROVIDER_SITE_OTHER): Payer: Medicare Other | Admitting: Vascular Surgery

## 2018-11-10 DIAGNOSIS — Z23 Encounter for immunization: Secondary | ICD-10-CM | POA: Diagnosis not present

## 2018-11-19 ENCOUNTER — Other Ambulatory Visit: Payer: Self-pay

## 2018-11-19 ENCOUNTER — Encounter: Payer: Self-pay | Admitting: Family Medicine

## 2018-11-19 ENCOUNTER — Ambulatory Visit (INDEPENDENT_AMBULATORY_CARE_PROVIDER_SITE_OTHER): Payer: Medicare Other | Admitting: Family Medicine

## 2018-11-19 VITALS — BP 112/68 | HR 76 | Resp 16 | Ht 67.0 in | Wt 139.4 lb

## 2018-11-19 DIAGNOSIS — K30 Functional dyspepsia: Secondary | ICD-10-CM

## 2018-11-19 DIAGNOSIS — F17210 Nicotine dependence, cigarettes, uncomplicated: Secondary | ICD-10-CM | POA: Diagnosis not present

## 2018-11-19 DIAGNOSIS — I10 Essential (primary) hypertension: Secondary | ICD-10-CM | POA: Diagnosis not present

## 2018-11-19 DIAGNOSIS — I739 Peripheral vascular disease, unspecified: Secondary | ICD-10-CM

## 2018-11-19 DIAGNOSIS — E782 Mixed hyperlipidemia: Secondary | ICD-10-CM

## 2018-11-19 DIAGNOSIS — I2510 Atherosclerotic heart disease of native coronary artery without angina pectoris: Secondary | ICD-10-CM | POA: Diagnosis not present

## 2018-11-19 DIAGNOSIS — I6523 Occlusion and stenosis of bilateral carotid arteries: Secondary | ICD-10-CM | POA: Diagnosis not present

## 2018-11-19 MED ORDER — METOCLOPRAMIDE HCL 10 MG PO TABS: 10.0000 mg | ORAL_TABLET | Freq: Three times a day (TID) | ORAL | 5 refills | Status: DC

## 2018-11-19 MED ORDER — ATORVASTATIN CALCIUM 40 MG PO TABS: 40.0000 mg | ORAL_TABLET | Freq: Every day | ORAL | 1 refills | Status: DC

## 2018-11-19 MED ORDER — METOCLOPRAMIDE HCL 10 MG PO TABS: 10.0000 mg | ORAL_TABLET | Freq: Three times a day (TID) | ORAL | 1 refills | Status: DC

## 2018-11-19 MED ORDER — LISINOPRIL 5 MG PO TABS: 5.0000 mg | ORAL_TABLET | Freq: Every day | ORAL | 1 refills | Status: DC

## 2018-11-19 MED ORDER — AMLODIPINE BESYLATE 10 MG PO TABS: 10.0000 mg | ORAL_TABLET | Freq: Every day | ORAL | 1 refills | Status: DC

## 2018-11-19 NOTE — Progress Notes (Signed)
Date:  11/19/2018   Name:  Dylan Villanueva   DOB:  [birth year]   MRN:  [id number]   Chief Complaint: Hypertension, GI Problem (wants you to take over the Reglan as he can not  get anyone to refill or call him back.  ), and Shoulder Pain (R shoulder over last few weeks hurts only when turning. )  Hypertension This is a chronic problem. The current episode started more than 1 year ago. The problem has been gradually improving since onset. The problem is controlled. Pertinent negatives include no anxiety, blurred vision, chest pain, headaches, malaise/fatigue, neck pain, orthopnea, palpitations, peripheral edema, PND, shortness of breath or sweats. There are no associated agents to hypertension. Risk factors for coronary artery disease include dyslipidemia and smoking/tobacco exposure. Past treatments include calcium channel blockers and ACE inhibitors. The current treatment provides moderate improvement. There are no compliance problems.  Hypertensive end-organ damage includes CAD/MI. There is no history of angina, kidney disease, CVA, heart failure, left ventricular hypertrophy, PVD or retinopathy. There is no history of chronic renal disease, a hypertension causing med or renovascular disease.  GI Problem Primary symptoms do not include fever, weight loss, fatigue, abdominal pain, nausea, vomiting, diarrhea, melena, hematemesis, jaundice, hematochezia, dysuria, myalgias, arthralgias or rash. Primary symptoms comment: history GIST tumor/ gastroparesis.  The illness does not include chills, anorexia, dysphagia, odynophagia, bloating, constipation, tenesmus, back pain or itching. Associated medical issues do not include inflammatory bowel disease, GERD, gallstones, liver disease, alcohol abuse, PUD, gastric bypass, bowel resection, irritable bowel syndrome, hemorrhoids or diverticulitis. Associated medical issues comments: gastric tumor removed/35mos.  Shoulder Pain  The pain is present in the right  shoulder. This is a new problem. The current episode started more than 1 month ago (6 weeks). There has been no history of extremity trauma. The problem occurs daily. The pain is at a severity of 3/10. The pain is mild. Associated symptoms include a limited range of motion. Pertinent negatives include no fever, inability to bear weight, itching, joint locking, joint swelling, numbness, stiffness or tingling.  Nicotine Dependence Presents for follow-up visit. Symptoms are negative for fatigue and sore throat. His urge triggers include company of smokers. The symptoms have been worsening. He smokes 1 pack of cigarettes per day. There is no history of alcohol abuse.    Review of Systems  Constitutional: Negative for chills, fatigue, fever, malaise/fatigue and weight loss.  HENT: Negative for drooling, ear discharge, ear pain and sore throat.   Eyes: Negative for blurred vision.  Respiratory: Negative for cough, shortness of breath and wheezing.   Cardiovascular: Negative for chest pain, palpitations, orthopnea, leg swelling and PND.  Gastrointestinal: Negative for abdominal pain, anorexia, bloating, blood in stool, constipation, diarrhea, dysphagia, hematemesis, hematochezia, jaundice, melena, nausea and vomiting.  Endocrine: Negative for polydipsia.  Genitourinary: Negative for dysuria, frequency, hematuria and urgency.  Musculoskeletal: Negative for arthralgias, back pain, myalgias, neck pain and stiffness.  Skin: Negative for itching and rash.  Allergic/Immunologic: Negative for environmental allergies.  Neurological: Negative for dizziness, tingling, numbness and headaches.  Hematological: Does not bruise/bleed easily.  Psychiatric/Behavioral: Negative for suicidal ideas. The patient is not nervous/anxious.     Patient Active Problem List   Diagnosis Date Noted  . Atherosclerosis of native arteries of extremity with intermittent claudication ([place]) 06/25/2018  . Current every day smoker  06/25/2018  . Benign gastrointestinal stromal tumor (GIST)   . History of colonic polyps   . Benign neoplasm of transverse colon   . Benign  neoplasm of ascending colon   . Gastric mass   . Bilateral carotid artery stenosis 08/30/2017  . Benign essential HTN 05/25/2017  . Non-ST elevation myocardial infarction (NSTEMI), subendocardial infarction, subsequent episode of care ([place]) 05/25/2017  . Chronic venous insufficiency 05/08/2017  . Coronary artery disease involving native coronary artery of native heart 05/08/2017  . Hyperlipidemia, mixed 05/08/2017  . Hypertension 05/08/2017  . History of prostate cancer 05/08/2017  . Gait instability 05/08/2017  . Vitamin D deficiency 05/08/2017  . Presence of stent in coronary artery in patient with coronary artery disease 05/08/2017  . History of shingles 05/08/2017    Allergies  Allergen Reactions  . Penicillins Nausea Only    Has patient had a PCN reaction causing immediate rash, facial/tongue/throat swelling, SOB or lightheadedness with hypotension: no Has patient had a PCN reaction causing severe rash involving mucus membranes or skin necrosis: no Has patient had a PCN reaction that required hospitalization: no Has patient had a PCN reaction occurring within the last 10 years: no If all of the above answers are "NO", then may proceed with Cephalosporin use.     Past Surgical History:  Procedure Laterality Date  . APPENDECTOMY  1980  . CARDIAC SURGERY    . COLONOSCOPY WITH PROPOFOL N/A 03/02/2018   Procedure: COLONOSCOPY WITH PROPOFOL;  Surgeon: [name], MD;  Location: [facility];  Service: Endoscopy;  Laterality: N/A;  . CORONARY ANGIOPLASTY WITH STENT PLACEMENT    . ESOPHAGOGASTRODUODENOSCOPY N/A 11/07/2017   Procedure: ESOPHAGOGASTRODUODENOSCOPY (EGD);  Surgeon: Jules Husbands, MD;  Location: [facility];  Service: General;  Laterality: N/A;  . ESOPHAGOGASTRODUODENOSCOPY (EGD) WITH PROPOFOL  03/02/2018   Procedure:  ESOPHAGOGASTRODUODENOSCOPY (EGD) WITH PROPOFOL;  Surgeon: [name], MD;  Location: [facility];  Service: Endoscopy;;  . EUS N/A 07/13/2017   Procedure: FULL UPPER ENDOSCOPIC ULTRASOUND (EUS) RADIAL;  Surgeon: Jola Schmidt, MD;  Location: [facility];  Service: Endoscopy;  Laterality: N/A;  . HIGH DOSE RADIATION IMPLANT INSERTION  2006  . LAPAROSCOPIC REMOVAL ABDOMINAL MASS N/A 11/07/2017   Procedure: LAPAROSCOPIC GASTRIC MASS RESECTION;  Surgeon: Jules Husbands, MD;  Location: [facility];  Service: General;  Laterality: N/A;  . LOWER EXTREMITY ANGIOGRAPHY Left 07/04/2018   Procedure: LOWER EXTREMITY ANGIOGRAPHY;  Surgeon: Katha Cabal, MD;  Location: [facility];  Service: Cardiovascular;  Laterality: Left;  . LOWER EXTREMITY ANGIOGRAPHY Left 08/10/2018   Procedure: LOWER EXTREMITY ANGIOGRAPHY;  Surgeon: Katha Cabal, MD;  Location: [facility];  Service: Cardiovascular;  Laterality: Left;  . LOWER EXTREMITY ANGIOGRAPHY Left 09/12/2018   Procedure: LOWER EXTREMITY ANGIOGRAPHY;  Surgeon: Katha Cabal, MD;  Location: [facility];  Service: Cardiovascular;  Laterality: Left;  . POLYPECTOMY  03/02/2018   Procedure: POLYPECTOMY;  Surgeon: [name], MD;  Location: [facility];  Service: Endoscopy;;    Social History   Tobacco Use  . Smoking status: Current Every Day Smoker    Packs/day: 1.00    Years: 66.00    Pack years: 66.00    Types: Cigarettes    Start date: 11/18/1954  . Smokeless tobacco: Never Used  . Tobacco comment:      Substance Use Topics  . Alcohol use: Yes    Alcohol/week: 3.0 standard drinks    Types: 3 Cans of beer per week  . Drug use: Never     Medication list has been reviewed and updated.  Current Meds  Medication Sig  . amLODipine (NORVASC) 10  MG tablet Take 1 tablet (10 mg total) by mouth daily.  Marland Kitchen apixaban (ELIQUIS) 5 MG TABS tablet Take 1 tablet (5 mg total) by mouth 2 (two) times daily.  Marland Kitchen  atorvastatin (LIPITOR) 40 MG tablet Take 1 tablet (40 mg total) by mouth daily.  . cilostazol (PLETAL) 100 MG tablet TAKE 1 TABLET(100 MG) BY MOUTH TWICE DAILY (Patient taking differently: Take 100 mg by mouth 2 (two) times daily. )  . clopidogrel (PLAVIX) 75 MG tablet TAKE 1 TABLET(75 MG) BY MOUTH DAILY (Patient taking differently: Take 75 mg by mouth daily. TAKE 1 TABLET(75 MG) BY MOUTH DAILY)  . lisinopril (ZESTRIL) 5 MG tablet Take 1 tablet (5 mg total) by mouth daily.  . metoCLOPramide (REGLAN) 10 MG tablet Take 1 tablet (10 mg total) by mouth 3 (three) times daily.  . Multiple Vitamins-Minerals (MULTIVITAMIN ADULT PO) Take 1 tablet by mouth daily.   . Vitamin D, Cholecalciferol, 1000 units CAPS Take 1 capsule by mouth daily.    PHQ 2/9 Scores 08/08/2018 06/08/2018 06/01/2017 05/08/2017  PHQ - 2 Score 0 0 0 0  PHQ- 9 Score 0 0 0 -    BP Readings from Last 3 Encounters:  11/19/18 112/68  10/01/18 113/68  09/18/18 126/66    Physical Exam Vitals signs and nursing note reviewed.  HENT:     Head: Normocephalic.     Right Ear: Tympanic membrane, ear canal and external ear normal.     Left Ear: Tympanic membrane, ear canal and external ear normal.     Nose: Nose normal.     Mouth/Throat:     Mouth: Mucous membranes are moist.     Pharynx: Oropharynx is clear. No oropharyngeal exudate or posterior oropharyngeal erythema.  Eyes:     General: No scleral icterus.       Right eye: No discharge.        Left eye: No discharge.     Conjunctiva/sclera: Conjunctivae normal.     Pupils: Pupils are equal, round, and reactive to light.  Neck:     Musculoskeletal: Normal range of motion and neck supple.     Thyroid: No thyromegaly.     Vascular: No JVD.     Trachea: No tracheal deviation.  Cardiovascular:     Rate and Rhythm: Normal rate and regular rhythm.     Heart sounds: Normal heart sounds, S1 normal and S2 normal. No murmur. No systolic murmur. No diastolic murmur. No friction rub. No  gallop. No S3 or S4 sounds.   Pulmonary:     Effort: No respiratory distress.     Breath sounds: Normal breath sounds. No wheezing or rales.  Abdominal:     General: Bowel sounds are normal.     Palpations: Abdomen is soft. There is no mass.     Tenderness: There is no abdominal tenderness. There is no guarding or rebound.  Musculoskeletal:        General: No tenderness.     Right lower leg: No edema.     Left lower leg: No edema.  Lymphadenopathy:     Cervical: No cervical adenopathy.  Skin:    General: Skin is warm.     Findings: No rash.  Neurological:     Mental Status: He is alert and oriented to person, place, and time.     Cranial Nerves: No cranial nerve deficit.     Deep Tendon Reflexes: Reflexes are normal and symmetric.     Wt Readings from Last 3 Encounters:  11/19/18 139 lb 6.4 oz (63.2 kg)  10/01/18 141 lb (64 kg)  09/18/18 141 lb (64 kg)    BP 112/68   Pulse 76   Resp 16   Ht 5\' 7"  (1.702 m)   Wt 139 lb 6.4 oz (63.2 kg)   SpO2 99%   BMI 21.83 kg/m   Assessment and Plan: 1. Essential hypertension Chronic.  Controlled.  Continue lisinopril 5 mg, amlodipine 10 mg once a day.  Will check renal function panel.  Will recheck patient's blood pressure in 6 months. - lisinopril (ZESTRIL) 5 MG tablet; Take 1 tablet (5 mg total) by mouth daily.  Dispense: 90 tablet; Refill: 1 - amLODipine (NORVASC) 10 MG tablet; Take 1 tablet (10 mg total) by mouth daily.  Dispense: 90 tablet; Refill: 1 - Renal Function Panel  2. Hyperlipidemia, mixed Chronic.  Controlled.  Continue atorvastatin 40 mg once a day.  Will check lipid panel.  Will recheck in 6 months. - atorvastatin (LIPITOR) 40 MG tablet; Take 1 tablet (40 mg total) by mouth daily.  Dispense: 90 tablet; Refill: 1 - Lipid Panel With LDL/HDL Ratio  3. Delayed gastric transit Patient was put on Reglan by Dr. Deboraha Sprang for burping.  Patient has had improvement of symptoms and would like to continue.  Will refill Reglan 10  mg 1 tablet by mouth 3 times a day as needed. - metoCLOPramide (REGLAN) 10 MG tablet; Take 1 tablet (10 mg total) by mouth 3 (three) times daily.  Dispense: 60 tablet; Refill: 1  4. Cigarette nicotine dependence without complication Patient has been advised of the health risks of smoking and counseled concerning cessation of tobacco products. I spent over 3 minutes for discussion and to answer questions.  5. Coronary artery disease involving native coronary artery of native heart without angina pectoris Patient is followed with Dr. Nehemiah Massed for coronary artery disease.  Patient is doing well without chest pain.  And will continue lisinopril and amlodipine as directed. - lisinopril (ZESTRIL) 5 MG tablet; Take 1 tablet (5 mg total) by mouth daily.  Dispense: 90 tablet; Refill: 1 - amLODipine (NORVASC) 10 MG tablet; Take 1 tablet (10 mg total) by mouth daily.  Dispense: 90 tablet; Refill: 1  6. PAD (peripheral artery disease) ([place]) .  Patient is followed by Dr. Ronalee Belts.  Patient is currently on Pletal and that will be continued by vascular - lisinopril (ZESTRIL) 5 MG tablet; Take 1 tablet (5 mg total) by mouth daily.  Dispense: 90 tablet; Refill: 1 - amLODipine (NORVASC) 10 MG tablet; Take 1 tablet (10 mg total) by mouth daily.  Dispense: 90 tablet; Refill: 1

## 2018-11-19 NOTE — Patient Instructions (Signed)

## 2018-11-20 LAB — RENAL FUNCTION PANEL
Albumin: 4.7 g/dL (ref 3.7–4.7)
BUN/Creatinine Ratio: 14 (ref 10–24)
BUN: 13 mg/dL (ref 8–27)
CO2: 25 mmol/L (ref 20–29)
Calcium: 9.7 mg/dL (ref 8.6–10.2)
Chloride: 105 mmol/L (ref 96–106)
Creatinine, Ser: 0.93 mg/dL (ref 0.76–1.27)
GFR calc Af Amer: 90 mL/min/{1.73_m2} (ref >59)
GFR calc non Af Amer: 78 mL/min/{1.73_m2} (ref >59)
Glucose: 106 mg/dL — ABNORMAL HIGH (ref 65–99)
Phosphorus: 3.7 mg/dL (ref 2.8–4.1)
Potassium: 4.3 mmol/L (ref 3.5–5.2)
Sodium: 143 mmol/L (ref 134–144)

## 2018-11-20 LAB — LIPID PANEL WITH LDL/HDL RATIO
Cholesterol, Total: 133 mg/dL (ref 100–199)
HDL: 67 mg/dL (ref >39)
LDL Chol Calc (NIH): 50 mg/dL (ref 0–99)
LDL/HDL Ratio: 0.7 ratio (ref 0.0–3.6)
Triglycerides: 80 mg/dL (ref 0–149)
VLDL Cholesterol Cal: 16 mg/dL (ref 5–40)

## 2019-01-03 ENCOUNTER — Encounter (INDEPENDENT_AMBULATORY_CARE_PROVIDER_SITE_OTHER): Payer: Self-pay | Admitting: Vascular Surgery

## 2019-01-03 ENCOUNTER — Ambulatory Visit (INDEPENDENT_AMBULATORY_CARE_PROVIDER_SITE_OTHER): Payer: Medicare Other | Admitting: Vascular Surgery

## 2019-01-03 ENCOUNTER — Ambulatory Visit (INDEPENDENT_AMBULATORY_CARE_PROVIDER_SITE_OTHER): Payer: Medicare Other

## 2019-01-03 ENCOUNTER — Other Ambulatory Visit: Payer: Self-pay

## 2019-01-03 VITALS — BP 138/71 | HR 67 | Resp 17 | Ht 67.0 in | Wt 140.0 lb

## 2019-01-03 DIAGNOSIS — I872 Venous insufficiency (chronic) (peripheral): Secondary | ICD-10-CM

## 2019-01-03 DIAGNOSIS — I10 Essential (primary) hypertension: Secondary | ICD-10-CM

## 2019-01-03 DIAGNOSIS — Z955 Presence of coronary angioplasty implant and graft: Secondary | ICD-10-CM

## 2019-01-03 DIAGNOSIS — I70213 Atherosclerosis of native arteries of extremities with intermittent claudication, bilateral legs: Secondary | ICD-10-CM

## 2019-01-03 DIAGNOSIS — I6523 Occlusion and stenosis of bilateral carotid arteries: Secondary | ICD-10-CM

## 2019-01-03 DIAGNOSIS — I2510 Atherosclerotic heart disease of native coronary artery without angina pectoris: Secondary | ICD-10-CM

## 2019-01-06 ENCOUNTER — Encounter (INDEPENDENT_AMBULATORY_CARE_PROVIDER_SITE_OTHER): Payer: Self-pay | Admitting: Vascular Surgery

## 2019-01-06 NOTE — Progress Notes (Signed)
MRN : [id number]  Dylan Villanueva is a 78 y.o. (10/19/1940) male who presents with chief complaint of  Chief Complaint  Patient presents with  . Follow-up  .  History of Present Illness:   The patient returns to the office for followup and review status post angiogram with intervention on 09/12/2018.   Procedure(s) Performed: 1. Introduction catheter intoleftlower extremity 3rd order catheter placement from right femoral approach 2.Introduction catheter into left lower extremity third order catheter placement from left ankle approach  3. Ultrasound-guided access to theright common femoral artery and left posterior tibial arteryartery 4.Crosser atherectomy withpercutaneous transluminal angioplastyand stent placement leftsuperficial femoral and popliteal arteries 4. Percutaneous transluminal angioplastyleft proximal posterior tibial and tibial peroneal trunk  5. Percutaneous transluminal angioplasty left external iliac artery secondary to in-stent restenosis 6.Star close closurerightcommon femoral arteriotomyand manual pressure held left posterior tibial at the ankle  The patient notes improvement in the lower extremity symptoms. No interval shortening of the patient's claudication distance or rest pain symptoms. No new ulcers or wounds have occurred since the last visit.  There have been no significant changes to the patient's overall health care.  The patient denies amaurosis fugax or recent TIA symptoms. There are no recent neurological changes noted. The patient denies history of DVT, PE or superficial thrombophlebitis. The patient denies recent episodes of angina or shortness of breath.   ABI's Rt=0.95 and Lt=1.04  (previous ABI's Rt=1.02 and Lt=0.99)  Duplex ultrasound of the left SFA mild to moderate stenosis of the proximal SFA, the SFA stent is widely patent   Current Meds  Medication Sig  . amLODipine (NORVASC) 10 MG tablet Take 1 tablet (10 mg total) by mouth daily.  Marland Kitchen apixaban (ELIQUIS) 5 MG TABS tablet Take 1 tablet (5 mg total) by mouth 2 (two) times daily.  Marland Kitchen atorvastatin (LIPITOR) 40 MG tablet Take 1 tablet (40 mg total) by mouth daily.  . cilostazol (PLETAL) 100 MG tablet TAKE 1 TABLET(100 MG) BY MOUTH TWICE DAILY (Patient taking differently: Take 100 mg by mouth 2 (two) times daily. )  . clopidogrel (PLAVIX) 75 MG tablet TAKE 1 TABLET(75 MG) BY MOUTH DAILY (Patient taking differently: Take 75 mg by mouth daily. TAKE 1 TABLET(75 MG) BY MOUTH DAILY)  . lisinopril (ZESTRIL) 5 MG tablet Take 1 tablet (5 mg total) by mouth daily.  . metoCLOPramide (REGLAN) 10 MG tablet Take 1 tablet (10 mg total) by mouth 3 (three) times daily.  . Multiple Vitamins-Minerals (MULTIVITAMIN ADULT PO) Take 1 tablet by mouth daily.   . Vitamin D, Cholecalciferol, 1000 units CAPS Take 1 capsule by mouth daily.    Past Medical History:  Diagnosis Date  . Cancer [facility]) 2006   prostate   . Colon polyps   . Hyperlipidemia   . Hypertension   . Myocardial infarction ([place])   . Prostate CA ([place])   . Smoker   . Squamous acanthoma of external ear, right     Past Surgical History:  Procedure Laterality Date  . APPENDECTOMY  1980  . CARDIAC SURGERY    . COLONOSCOPY WITH PROPOFOL N/A 03/02/2018   Procedure: COLONOSCOPY WITH PROPOFOL;  Surgeon: [name], MD;  Location: [place];  Service: Endoscopy;  Laterality: N/A;  . CORONARY ANGIOPLASTY WITH STENT PLACEMENT    . ESOPHAGOGASTRODUODENOSCOPY N/A 11/07/2017   Procedure: ESOPHAGOGASTRODUODENOSCOPY (EGD);  Surgeon: Jules Husbands, MD;  Location: [facility];  Service: General;  Laterality: N/A;  . ESOPHAGOGASTRODUODENOSCOPY (EGD) WITH PROPOFOL  03/02/2018   Procedure:  ESOPHAGOGASTRODUODENOSCOPY (EGD) WITH PROPOFOL;  Surgeon: [name], MD;  Location: [place];  Service: Endoscopy;;  . EUS N/A  07/13/2017   Procedure: FULL UPPER ENDOSCOPIC ULTRASOUND (EUS) RADIAL;  Surgeon: Jola Schmidt, MD;  Location: [facility];  Service: Endoscopy;  Laterality: N/A;  . HIGH DOSE RADIATION IMPLANT INSERTION  2006  . LAPAROSCOPIC REMOVAL ABDOMINAL MASS N/A 11/07/2017   Procedure: LAPAROSCOPIC GASTRIC MASS RESECTION;  Surgeon: Jules Husbands, MD;  Location: [facility];  Service: General;  Laterality: N/A;  . LOWER EXTREMITY ANGIOGRAPHY Left 07/04/2018   Procedure: LOWER EXTREMITY ANGIOGRAPHY;  Surgeon: Katha Cabal, MD;  Location: [place] [facility];  Service: Cardiovascular;  Laterality: Left;  . LOWER EXTREMITY ANGIOGRAPHY Left 08/10/2018   Procedure: LOWER EXTREMITY ANGIOGRAPHY;  Surgeon: Katha Cabal, MD;  Location: [place] [facility];  Service: Cardiovascular;  Laterality: Left;  . LOWER EXTREMITY ANGIOGRAPHY Left 09/12/2018   Procedure: LOWER EXTREMITY ANGIOGRAPHY;  Surgeon: Katha Cabal, MD;  Location: [place] [facility];  Service: Cardiovascular;  Laterality: Left;  . POLYPECTOMY  03/02/2018   Procedure: POLYPECTOMY;  Surgeon: [name], MD;  Location: [facility];  Service: Endoscopy;;    Social History Social History   Tobacco Use  . Smoking status: Current Every Day Smoker    Packs/day: 1.00    Years: 66.00    Pack years: 66.00    Types: Cigarettes    Start date: 11/18/1952  . Smokeless tobacco: Never Used  . Tobacco comment:      Substance Use Topics  . Alcohol use: Yes    Alcohol/week: 3.0 standard drinks    Types: 3 Cans of beer per week  . Drug use: Never    Family History Family History  Problem Relation Age of Onset  . Cancer Mother        pancreatic  . Dementia Father   . Cancer Sister        breast   . Aneurysm Brother     Allergies  Allergen Reactions  . Penicillins Nausea Only    Has patient had a PCN reaction causing immediate rash, facial/tongue/throat swelling, SOB or lightheadedness with hypotension: no Has patient had a  PCN reaction causing severe rash involving mucus membranes or skin necrosis: no Has patient had a PCN reaction that required hospitalization: no Has patient had a PCN reaction occurring within the last 10 years: no If all of the above answers are "NO", then may proceed with Cephalosporin use.      REVIEW OF SYSTEMS (Negative unless checked)  Constitutional: [] Weight loss  [] Fever  [] Chills Cardiac: [] Chest pain   [] Chest pressure   [] Palpitations   [] Shortness of breath when laying flat   [] Shortness of breath with exertion. Vascular:  [x] Pain in legs with walking   [] Pain in legs at rest  [] History of DVT   [] Phlebitis   [] Swelling in legs   [] Varicose veins   [] Non-healing ulcers Pulmonary:   [] Uses home oxygen   [] Productive cough   [] Hemoptysis   [] Wheeze  [] COPD   [] Asthma Neurologic:  [] Dizziness   [] Seizures   [] History of stroke   [] History of TIA  [] Aphasia   [] Vissual changes   [] Weakness or numbness in arm   [] Weakness or numbness in leg Musculoskeletal:   [] Joint swelling   [] Joint pain   [] Low back pain Hematologic:  [] Easy bruising  [] Easy bleeding   [] Hypercoagulable state   [] Anemic Gastrointestinal:  [] Diarrhea   [] Vomiting  [] Gastroesophageal reflux/heartburn   []   Difficulty swallowing. Genitourinary:  [] Chronic kidney disease   [] Difficult urination  [] Frequent urination   [] Blood in urine Skin:  [] Rashes   [] Ulcers  Psychological:  [] History of anxiety   []  History of major depression.  Physical Examination  Vitals:   01/03/19 1115  BP: 138/71  Pulse: 67  Resp: 17  Weight: 140 lb (63.5 kg)  Height: 5\' 7"  (1.702 m)   Body mass index is 21.93 kg/m. Gen: WD/WN, NAD Head: [place]/AT, No temporalis wasting.  Ear/Nose/Throat: Hearing grossly intact, nares w/o erythema or drainage Eyes: PER, EOMI, sclera nonicteric.  Neck: Supple, no large masses.   Pulmonary:  Good air movement, no audible wheezing bilaterally, no use of accessory muscles.  Cardiac: RRR, no JVD  Vascular:  Vessel Right Left  Radial Palpable Palpable  PT Palpable Palpable  DP Palpable Trace Palpable  Gastrointestinal: Non-distended. No guarding/no peritoneal signs.  Musculoskeletal: M/S 5/5 throughout.  No deformity or atrophy.  Neurologic: CN 2-12 intact. Symmetrical.  Speech is fluent. Motor exam as listed above. Psychiatric: Judgment intact, Mood & affect appropriate for pt's clinical situation. Dermatologic: No rashes or ulcers noted.  No changes consistent with cellulitis. Lymph : No lichenification or skin changes of chronic lymphedema.  CBC Lab Results  Component Value Date   WBC 7.3 03/06/2018   HGB 13.8 03/06/2018   HCT 40.8 03/06/2018   MCV 100.7 (H) 03/06/2018   PLT 241 03/06/2018    BMET    Component Value Date/Time   NA 143 11/19/2018 1128   K 4.3 11/19/2018 1128   CL 105 11/19/2018 1128   CO2 25 11/19/2018 1128   GLUCOSE 106 (H) 11/19/2018 1128   GLUCOSE 126 (H) 03/06/2018 0900   BUN 13 11/19/2018 1128   CREATININE 0.93 11/19/2018 1128   CALCIUM 9.7 11/19/2018 1128   GFRNONAA 78 11/19/2018 1128   GFRAA 90 11/19/2018 1128   CrCl cannot be calculated (Patient's most recent lab result is older than the maximum 21 days allowed.).  COAG Lab Results  Component Value Date   INR 0.98 11/02/2017    Radiology No results found.   Assessment/Plan 1. Atherosclerosis of native artery of both lower extremities with intermittent claudication (HCC)  Recommend:  The patient has evidence of atherosclerosis of the lower extremities with claudication.  The patient does not voice lifestyle limiting changes at this point in time.  Noninvasive studies do not suggest clinically significant change.  No invasive studies, angiography or surgery at this time The patient should continue walking and begin a more formal exercise program.  The patient should continue antiplatelet therapy and aggressive treatment of the lipid abnormalities  No changes in the  patient's medications at this time  The patient should continue wearing graduated compression socks 10-15 mmHg strength to control the mild edema.   - ABI; Future - LE ARTERIAL; Future  2. Bilateral carotid artery stenosis Recommend:  Given the patient's asymptomatic subcritical stenosis no further invasive testing or surgery at this time.  Duplex ultrasound shows <50% stenosis bilaterally.  Continue antiplatelet therapy as prescribed Continue management of CAD, HTN and Hyperlipidemia Healthy heart diet,  encouraged exercise at least 4 times per week Follow up in 12 months with duplex ultrasound and physical exam   3. Chronic venous insufficiency No surgery or intervention at this point in time.    I have had a long discussion with the patient regarding venous insufficiency and why it  causes symptoms. I have discussed with the patient the chronic skin changes  that accompany venous insufficiency and the long term sequela such as infection and ulceration.  Patient will begin wearing graduated compression stockings class 1 (20-30 mmHg) or compression wraps on a daily basis a prescription was given. The patient will put the stockings on first thing in the morning and removing them in the evening. The patient is instructed specifically not to sleep in the stockings.    In addition, behavioral modification including several periods of elevation of the lower extremities during the day will be continued. I have demonstrated that proper elevation is a position with the ankles at heart level.  The patient is instructed to begin routine exercise, especially walking on a daily basis   4. Benign essential HTN Continue antihypertensive medications as already ordered, these medications have been reviewed and there are no changes at this time.   5. Presence of stent in coronary artery in patient with coronary artery disease Continue cardiac and antihypertensive medications as already ordered and  reviewed, no changes at this time.  Continue statin as ordered and reviewed, no changes at this time  Nitrates PRN for chest pain    Hortencia Pilar, MD  01/06/2019 11:43 AM

## 2019-01-07 ENCOUNTER — Other Ambulatory Visit: Payer: Self-pay

## 2019-01-07 DIAGNOSIS — I2510 Atherosclerotic heart disease of native coronary artery without angina pectoris: Secondary | ICD-10-CM

## 2019-01-07 DIAGNOSIS — I739 Peripheral vascular disease, unspecified: Secondary | ICD-10-CM

## 2019-01-07 DIAGNOSIS — I10 Essential (primary) hypertension: Secondary | ICD-10-CM

## 2019-01-07 MED ORDER — LISINOPRIL 5 MG PO TABS: 5.0000 mg | ORAL_TABLET | Freq: Every day | ORAL | 0 refills | Status: DC

## 2019-01-09 DIAGNOSIS — I2510 Atherosclerotic heart disease of native coronary artery without angina pectoris: Secondary | ICD-10-CM | POA: Diagnosis not present

## 2019-01-09 DIAGNOSIS — I70219 Atherosclerosis of native arteries of extremities with intermittent claudication, unspecified extremity: Secondary | ICD-10-CM | POA: Diagnosis not present

## 2019-01-09 DIAGNOSIS — I6523 Occlusion and stenosis of bilateral carotid arteries: Secondary | ICD-10-CM | POA: Diagnosis not present

## 2019-01-09 DIAGNOSIS — I10 Essential (primary) hypertension: Secondary | ICD-10-CM | POA: Diagnosis not present

## 2019-01-09 DIAGNOSIS — I214 Non-ST elevation (NSTEMI) myocardial infarction: Secondary | ICD-10-CM | POA: Diagnosis not present

## 2019-01-09 DIAGNOSIS — E782 Mixed hyperlipidemia: Secondary | ICD-10-CM | POA: Diagnosis not present

## 2019-02-28 DIAGNOSIS — Z23 Encounter for immunization: Secondary | ICD-10-CM | POA: Diagnosis not present

## 2019-03-22 DIAGNOSIS — Z23 Encounter for immunization: Secondary | ICD-10-CM | POA: Diagnosis not present

## 2019-03-28 ENCOUNTER — Other Ambulatory Visit: Payer: Self-pay

## 2019-03-28 DIAGNOSIS — E782 Mixed hyperlipidemia: Secondary | ICD-10-CM

## 2019-03-28 MED ORDER — ATORVASTATIN CALCIUM 40 MG PO TABS: 40.0000 mg | ORAL_TABLET | Freq: Every day | ORAL | 0 refills | Status: DC

## 2019-05-20 ENCOUNTER — Encounter: Payer: Self-pay | Admitting: Family Medicine

## 2019-05-20 ENCOUNTER — Ambulatory Visit (INDEPENDENT_AMBULATORY_CARE_PROVIDER_SITE_OTHER): Payer: Medicare Other | Admitting: Family Medicine

## 2019-05-20 ENCOUNTER — Other Ambulatory Visit: Payer: Self-pay

## 2019-05-20 VITALS — BP 120/70 | HR 76 | Ht 67.0 in | Wt 143.0 lb

## 2019-05-20 DIAGNOSIS — E782 Mixed hyperlipidemia: Secondary | ICD-10-CM | POA: Diagnosis not present

## 2019-05-20 DIAGNOSIS — Z23 Encounter for immunization: Secondary | ICD-10-CM

## 2019-05-20 DIAGNOSIS — I2510 Atherosclerotic heart disease of native coronary artery without angina pectoris: Secondary | ICD-10-CM

## 2019-05-20 DIAGNOSIS — I10 Essential (primary) hypertension: Secondary | ICD-10-CM | POA: Diagnosis not present

## 2019-05-20 DIAGNOSIS — R69 Illness, unspecified: Secondary | ICD-10-CM

## 2019-05-20 DIAGNOSIS — I70219 Atherosclerosis of native arteries of extremities with intermittent claudication, unspecified extremity: Secondary | ICD-10-CM | POA: Diagnosis not present

## 2019-05-20 DIAGNOSIS — I739 Peripheral vascular disease, unspecified: Secondary | ICD-10-CM | POA: Diagnosis not present

## 2019-05-20 MED ORDER — ATORVASTATIN CALCIUM 40 MG PO TABS: 40.0000 mg | ORAL_TABLET | Freq: Every day | ORAL | 1 refills | Status: DC

## 2019-05-20 MED ORDER — AMLODIPINE BESYLATE 10 MG PO TABS: 10.0000 mg | ORAL_TABLET | Freq: Every day | ORAL | 1 refills | Status: DC

## 2019-05-20 MED ORDER — LISINOPRIL 5 MG PO TABS: 5.0000 mg | ORAL_TABLET | Freq: Every day | ORAL | 1 refills | Status: DC

## 2019-05-20 MED ORDER — CLOPIDOGREL BISULFATE 75 MG PO TABS: 75.0000 mg | ORAL_TABLET | Freq: Every day | ORAL | 1 refills | Status: DC

## 2019-05-20 MED ORDER — CILOSTAZOL 100 MG PO TABS: ORAL_TABLET | ORAL | 1 refills | Status: DC

## 2019-05-20 NOTE — Progress Notes (Signed)
Date:  05/20/2019   Name:  Dylan Villanueva   DOB:  Jun 22, 1940   MRN:  [id number]   Chief Complaint: Hypertension, Hyperlipidemia, PVD, and pneum 23  Hypertension This is a chronic problem. The current episode started more than 1 year ago. The problem has been gradually improving since onset. The problem is controlled. Pertinent negatives include no anxiety, blurred vision, chest pain, headaches, malaise/fatigue, neck pain, orthopnea, palpitations, peripheral edema, PND, shortness of breath or sweats. There are no associated agents to hypertension. Risk factors for coronary artery disease include dyslipidemia. Past treatments include calcium channel blockers and ACE inhibitors. The current treatment provides moderate improvement. There are no compliance problems.  Hypertensive end-organ damage includes CAD/MI and PVD. There is no history of angina, kidney disease, CVA, heart failure, left ventricular hypertrophy or retinopathy. There is no history of chronic renal disease, a hypertension causing med or renovascular disease.  Hyperlipidemia This is a chronic problem. The current episode started more than 1 year ago. The problem is controlled. Recent lipid tests were reviewed and are normal. He has no history of chronic renal disease, diabetes, hypothyroidism, liver disease, obesity or nephrotic syndrome. Pertinent negatives include no chest pain, focal sensory loss, focal weakness, leg pain, myalgias or shortness of breath. Current antihyperlipidemic treatment includes statins. The current treatment provides moderate improvement of lipids. There are no compliance problems.   Heart Problem This is a chronic problem. The current episode started more than 1 year ago. The problem occurs daily. The problem has been gradually improving. Pertinent negatives include no abdominal pain, anorexia, arthralgias, change in bowel habit, chest pain, chills, congestion, coughing, diaphoresis, fatigue, fever, headaches,  joint swelling, myalgias, nausea, neck pain, numbness, rash, sore throat, swollen glands, urinary symptoms, vertigo, visual change, vomiting or weakness. Nothing aggravates the symptoms.    Lab Results  Component Value Date   CREATININE 0.93 11/19/2018   BUN 13 11/19/2018   NA 143 11/19/2018   K 4.3 11/19/2018   CL 105 11/19/2018   CO2 25 11/19/2018   Lab Results  Component Value Date   CHOL 133 11/19/2018   HDL 67 11/19/2018   LDLCALC 50 11/19/2018   TRIG 80 11/19/2018   CHOLHDL 3.6 05/08/2017   Lab Results  Component Value Date   TSH 1.050 05/08/2017   No results found for: HGBA1C Lab Results  Component Value Date   WBC 7.3 03/06/2018   HGB 13.8 03/06/2018   HCT 40.8 03/06/2018   MCV 100.7 (H) 03/06/2018   PLT 241 03/06/2018   Lab Results  Component Value Date   ALT 20 03/06/2018   AST 23 03/06/2018   ALKPHOS 62 03/06/2018   BILITOT 0.5 03/06/2018     Review of Systems  Constitutional: Negative for chills, diaphoresis, fatigue, fever and malaise/fatigue.  HENT: Negative for congestion, drooling, ear discharge, ear pain and sore throat.   Eyes: Negative for blurred vision.  Respiratory: Negative for cough, shortness of breath and wheezing.   Cardiovascular: Negative for chest pain, palpitations, orthopnea, leg swelling and PND.  Gastrointestinal: Negative for abdominal pain, anorexia, blood in stool, change in bowel habit, constipation, diarrhea, nausea and vomiting.  Endocrine: Negative for polydipsia.  Genitourinary: Negative for dysuria, frequency, hematuria and urgency.       Frequency/stress urinary incontinence  Musculoskeletal: Negative for arthralgias, back pain, joint swelling, myalgias and neck pain.  Skin: Negative for rash.  Allergic/Immunologic: Negative for environmental allergies.  Neurological: Negative for dizziness, vertigo, focal weakness, weakness, numbness and headaches.  Hematological: Does not bruise/bleed easily.    Psychiatric/Behavioral: Negative for suicidal ideas. The patient is not nervous/anxious.     Patient Active Problem List   Diagnosis Date Noted  . Atherosclerosis of native arteries of extremity with intermittent claudication ([place]) 06/25/2018  . Current every day smoker 06/25/2018  . Benign gastrointestinal stromal tumor (GIST)   . History of colonic polyps   . Benign neoplasm of transverse colon   . Benign neoplasm of ascending colon   . Gastric mass   . Bilateral carotid artery stenosis 08/30/2017  . Benign essential HTN 05/25/2017  . Non-ST elevation myocardial infarction (NSTEMI), subendocardial infarction, subsequent episode of care ([place]) 05/25/2017  . Chronic venous insufficiency 05/08/2017  . Coronary artery disease involving native coronary artery of native heart 05/08/2017  . Hyperlipidemia, mixed 05/08/2017  . Hypertension 05/08/2017  . History of prostate cancer 05/08/2017  . Gait instability 05/08/2017  . Vitamin D deficiency 05/08/2017  . Presence of stent in coronary artery in patient with coronary artery disease 05/08/2017  . History of shingles 05/08/2017    Allergies  Allergen Reactions  . Penicillins Nausea Only    Has patient had a PCN reaction causing immediate rash, facial/tongue/throat swelling, SOB or lightheadedness with hypotension: no Has patient had a PCN reaction causing severe rash involving mucus membranes or skin necrosis: no Has patient had a PCN reaction that required hospitalization: no Has patient had a PCN reaction occurring within the last 10 years: no If all of the above answers are "NO", then may proceed with Cephalosporin use.     Past Surgical History:  Procedure Laterality Date  . APPENDECTOMY  1980  . CARDIAC SURGERY    . COLONOSCOPY WITH PROPOFOL N/A 03/02/2018   Procedure: COLONOSCOPY WITH PROPOFOL;  Surgeon: [name], MD;  Location: [place];  Service: Endoscopy;  Laterality: N/A;  . CORONARY ANGIOPLASTY WITH  STENT PLACEMENT    . ESOPHAGOGASTRODUODENOSCOPY N/A 11/07/2017   Procedure: ESOPHAGOGASTRODUODENOSCOPY (EGD);  Surgeon: Jules Husbands, MD;  Location: [facility];  Service: General;  Laterality: N/A;  . ESOPHAGOGASTRODUODENOSCOPY (EGD) WITH PROPOFOL  03/02/2018   Procedure: ESOPHAGOGASTRODUODENOSCOPY (EGD) WITH PROPOFOL;  Surgeon: [name], MD;  Location: [place];  Service: Endoscopy;;  . EUS N/A 07/13/2017   Procedure: FULL UPPER ENDOSCOPIC ULTRASOUND (EUS) RADIAL;  Surgeon: Jola Schmidt, MD;  Location: [facility];  Service: Endoscopy;  Laterality: N/A;  . HIGH DOSE RADIATION IMPLANT INSERTION  2006  . LAPAROSCOPIC REMOVAL ABDOMINAL MASS N/A 11/07/2017   Procedure: LAPAROSCOPIC GASTRIC MASS RESECTION;  Surgeon: Jules Husbands, MD;  Location: [facility];  Service: General;  Laterality: N/A;  . LOWER EXTREMITY ANGIOGRAPHY Left 07/04/2018   Procedure: LOWER EXTREMITY ANGIOGRAPHY;  Surgeon: Katha Cabal, MD;  Location: [place] [facility];  Service: Cardiovascular;  Laterality: Left;  . LOWER EXTREMITY ANGIOGRAPHY Left 08/10/2018   Procedure: LOWER EXTREMITY ANGIOGRAPHY;  Surgeon: Katha Cabal, MD;  Location: [place] [facility];  Service: Cardiovascular;  Laterality: Left;  . LOWER EXTREMITY ANGIOGRAPHY Left 09/12/2018   Procedure: LOWER EXTREMITY ANGIOGRAPHY;  Surgeon: Katha Cabal, MD;  Location: [place] [facility];  Service: Cardiovascular;  Laterality: Left;  . POLYPECTOMY  03/02/2018   Procedure: POLYPECTOMY;  Surgeon: [name], MD;  Location: [place];  Service: Endoscopy;;    Social History   Tobacco Use  . Smoking status: Current Every Day Smoker    Packs/day: 1.00    Years: 66.00    Pack years: 66.00  Types: Cigarettes    Start date: 11/18/1952  . Smokeless tobacco: Never Used  . Tobacco comment:      Substance Use Topics  . Alcohol use: Yes    Alcohol/week: 3.0 standard drinks    Types: 3 Cans of beer per week  . Drug use: Never      Medication list has been reviewed and updated.  Current Meds  Medication Sig  . amLODipine (NORVASC) 10 MG tablet Take 1 tablet (10 mg total) by mouth daily.  Marland Kitchen aspirin EC 81 MG tablet Take 81 mg by mouth daily.  Marland Kitchen atorvastatin (LIPITOR) 40 MG tablet Take 1 tablet (40 mg total) by mouth daily.  . cilostazol (PLETAL) 100 MG tablet TAKE 1 TABLET(100 MG) BY MOUTH TWICE DAILY (Patient taking differently: Take 100 mg by mouth 2 (two) times daily. )  . clopidogrel (PLAVIX) 75 MG tablet TAKE 1 TABLET(75 MG) BY MOUTH DAILY (Patient taking differently: Take 75 mg by mouth daily. TAKE 1 TABLET(75 MG) BY MOUTH DAILY)  . lisinopril (ZESTRIL) 5 MG tablet Take 1 tablet (5 mg total) by mouth daily.  . Multiple Vitamins-Minerals (MULTIVITAMIN ADULT PO) Take 1 tablet by mouth daily.   . Vitamin D, Cholecalciferol, 1000 units CAPS Take 1 capsule by mouth daily. (Patient taking differently: Take 2 capsules by mouth daily. )  . [DISCONTINUED] apixaban (ELIQUIS) 5 MG TABS tablet Take 1 tablet (5 mg total) by mouth 2 (two) times daily.    PHQ 2/9 Scores 05/20/2019 08/08/2018 06/08/2018 06/01/2017  PHQ - 2 Score 0 0 0 0  PHQ- 9 Score 0 0 0 0    BP Readings from Last 3 Encounters:  05/20/19 120/70  01/03/19 138/71  11/19/18 112/68    Physical Exam Vitals and nursing note reviewed.  HENT:     Head: Normocephalic.     Right Ear: Tympanic membrane, ear canal and external ear normal. There is no impacted cerumen.     Left Ear: Tympanic membrane, ear canal and external ear normal. There is no impacted cerumen.     Nose: Nose normal. No congestion or rhinorrhea.     Mouth/Throat:     Mouth: Mucous membranes are moist.  Eyes:     General: No scleral icterus.       Right eye: No discharge.        Left eye: No discharge.     Conjunctiva/sclera: Conjunctivae normal.     Pupils: Pupils are equal, round, and reactive to light.  Neck:     Thyroid: No thyromegaly.     Vascular: No JVD.     Trachea: No  tracheal deviation.  Cardiovascular:     Rate and Rhythm: Normal rate and regular rhythm.     Heart sounds: Normal heart sounds. No murmur. No friction rub. No gallop.   Pulmonary:     Effort: No respiratory distress.     Breath sounds: Normal breath sounds. No stridor. No wheezing, rhonchi or rales.  Chest:     Chest wall: No tenderness.  Abdominal:     General: Bowel sounds are normal.     Palpations: Abdomen is soft. There is no mass.     Tenderness: There is no abdominal tenderness. There is no guarding or rebound.  Musculoskeletal:        General: No tenderness. Normal range of motion.     Cervical back: Normal range of motion and neck supple.  Lymphadenopathy:     Cervical: No cervical adenopathy.  Skin:  General: Skin is warm.     Capillary Refill: Capillary refill takes less than 2 seconds.     Coloration: Skin is not jaundiced or pale.     Findings: No bruising, erythema, lesion or rash.  Neurological:     Mental Status: He is alert and oriented to person, place, and time.     Cranial Nerves: No cranial nerve deficit.     Deep Tendon Reflexes: Reflexes are normal and symmetric.     Wt Readings from Last 3 Encounters:  05/20/19 143 lb (64.9 kg)  01/03/19 140 lb (63.5 kg)  11/19/18 139 lb 6.4 oz (63.2 kg)    BP 120/70   Pulse 76   Ht 5\' 7"  (1.702 m)   Wt 143 lb (64.9 kg)   BMI 22.40 kg/m    Assessment and Plan:  1. Coronary artery disease involving native coronary artery of native heart without angina pectoris Chronic.  Controlled.  Coronary artery disease is followed with cardiology/[facility] at [facility].  Patient is to continue amlodipine 10 mg lisinopril 5 mg daily as well as Plavix for antiplatelet therapy.  We are also trying to control his cholesterol lipid levels to decrease his risk as well - amLODipine (NORVASC) 10 MG tablet; Take 1 tablet (10 mg total) by mouth daily.  Dispense: 90 tablet; Refill: 1 - clopidogrel (PLAVIX) 75 MG tablet; Take 1  tablet (75 mg total) by mouth daily. TAKE 1 TABLET(75 MG) BY MOUTH DAILY  Dispense: 90 tablet; Refill: 1 - lisinopril (ZESTRIL) 5 MG tablet; Take 1 tablet (5 mg total) by mouth daily.  Dispense: 90 tablet; Refill: 1 - Lipid Panel With LDL/HDL Ratio - Comprehensive Metabolic Panel (CMET)  2. PAD (peripheral artery disease) (HCC) Chronic.  Controlled.  Stable.  Continue amlodipine 10 mg once a day and lisinopril 5 mg once a day will check CMP to evaluate GFR.  Full vascular disease would also be improved with increasing cardiovascular exercise such as walking. - amLODipine (NORVASC) 10 MG tablet; Take 1 tablet (10 mg total) by mouth daily.  Dispense: 90 tablet; Refill: 1 - lisinopril (ZESTRIL) 5 MG tablet; Take 1 tablet (5 mg total) by mouth daily.  Dispense: 90 tablet; Refill: 1 - Lipid Panel With LDL/HDL Ratio - Comprehensive Metabolic Panel (CMET)  3. Essential hypertension Chronic.  Controlled.  Stable.  Continue amlodipine and lisinopril. - amLODipine (NORVASC) 10 MG tablet; Take 1 tablet (10 mg total) by mouth daily.  Dispense: 90 tablet; Refill: 1 - lisinopril (ZESTRIL) 5 MG tablet; Take 1 tablet (5 mg total) by mouth daily.  Dispense: 90 tablet; Refill: 1  4. Hyperlipidemia, mixed Chronic.  Controlled.  Stable.  Controlling atherosclerosis with atorvastatin 40 mg once a day. - atorvastatin (LIPITOR) 40 MG tablet; Take 1 tablet (40 mg total) by mouth daily.  Dispense: 90 tablet; Refill: 1 - Lipid Panel With LDL/HDL Ratio - Comprehensive Metabolic Panel (CMET)  5. Atherosclerotic peripheral vascular disease with intermittent claudication [facility]) Patient is followed by vascular Dr. Delana Meyer.  We will continue the Pletal 100 mg twice a day as well as Plavix 75 mg daily.  We will further control his blood pressure with above regimen as well as his cholesterol. - cilostazol (PLETAL) 100 MG tablet; TAKE 1 TABLET(100 MG) BY MOUTH TWICE DAILY  Dispense: 180 tablet; Refill: 1 - clopidogrel  (PLAVIX) 75 MG tablet; Take 1 tablet (75 mg total) by mouth daily. TAKE 1 TABLET(75 MG) BY MOUTH DAILY  Dispense: 90 tablet; Refill: 1  6.  Taking medication for chronic disease Patient is taking medications for chronic disease.  Review of previous CBCs have noted no thrombocytopenia from the platelet therapy.  7. Need for pneumococcal vaccination Discussed and administered. - Pneumococcal polysaccharide vaccine 23-valent greater than or equal to 2yo subcutaneous/IM

## 2019-05-21 LAB — COMPREHENSIVE METABOLIC PANEL
ALT: 16 IU/L (ref 0–44)
AST: 20 IU/L (ref 0–40)
Albumin/Globulin Ratio: 2 (ref 1.2–2.2)
Albumin: 4.3 g/dL (ref 3.7–4.7)
Alkaline Phosphatase: 74 IU/L (ref 39–117)
BUN/Creatinine Ratio: 14 (ref 10–24)
BUN: 12 mg/dL (ref 8–27)
Bilirubin Total: 0.3 mg/dL (ref 0.0–1.2)
CO2: 24 mmol/L (ref 20–29)
Calcium: 9.1 mg/dL (ref 8.6–10.2)
Chloride: 102 mmol/L (ref 96–106)
Creatinine, Ser: 0.86 mg/dL (ref 0.76–1.27)
GFR calc Af Amer: 95 mL/min/{1.73_m2} (ref >59)
GFR calc non Af Amer: 82 mL/min/{1.73_m2} (ref >59)
Globulin, Total: 2.1 g/dL (ref 1.5–4.5)
Glucose: 97 mg/dL (ref 65–99)
Potassium: 4.2 mmol/L (ref 3.5–5.2)
Sodium: 139 mmol/L (ref 134–144)
Total Protein: 6.4 g/dL (ref 6.0–8.5)

## 2019-05-21 LAB — LIPID PANEL WITH LDL/HDL RATIO
Cholesterol, Total: 112 mg/dL (ref 100–199)
HDL: 61 mg/dL (ref >39)
LDL Chol Calc (NIH): 38 mg/dL (ref 0–99)
LDL/HDL Ratio: 0.6 ratio (ref 0.0–3.6)
Triglycerides: 60 mg/dL (ref 0–149)
VLDL Cholesterol Cal: 13 mg/dL (ref 5–40)

## 2019-07-04 ENCOUNTER — Ambulatory Visit (INDEPENDENT_AMBULATORY_CARE_PROVIDER_SITE_OTHER): Payer: Medicare Other | Admitting: Nurse Practitioner

## 2019-07-04 ENCOUNTER — Encounter (INDEPENDENT_AMBULATORY_CARE_PROVIDER_SITE_OTHER): Payer: Medicare Other

## 2019-07-04 ENCOUNTER — Ambulatory Visit (INDEPENDENT_AMBULATORY_CARE_PROVIDER_SITE_OTHER): Payer: Medicare Other

## 2019-07-04 ENCOUNTER — Other Ambulatory Visit: Payer: Self-pay

## 2019-07-04 ENCOUNTER — Encounter (INDEPENDENT_AMBULATORY_CARE_PROVIDER_SITE_OTHER): Payer: Self-pay | Admitting: Nurse Practitioner

## 2019-07-04 VITALS — BP 137/66 | HR 65 | Ht 67.0 in | Wt 142.0 lb

## 2019-07-04 DIAGNOSIS — F17200 Nicotine dependence, unspecified, uncomplicated: Secondary | ICD-10-CM | POA: Diagnosis not present

## 2019-07-04 DIAGNOSIS — I70213 Atherosclerosis of native arteries of extremities with intermittent claudication, bilateral legs: Secondary | ICD-10-CM

## 2019-07-04 DIAGNOSIS — I10 Essential (primary) hypertension: Secondary | ICD-10-CM | POA: Diagnosis not present

## 2019-07-04 DIAGNOSIS — E782 Mixed hyperlipidemia: Secondary | ICD-10-CM

## 2019-07-04 DIAGNOSIS — I70219 Atherosclerosis of native arteries of extremities with intermittent claudication, unspecified extremity: Secondary | ICD-10-CM

## 2019-07-04 NOTE — Progress Notes (Signed)
Subjective:    Patient ID: Dylan Villanueva, male    DOB: [birth year], 79 y.o.   MRN: [id number] Chief Complaint  Patient presents with  . Follow-up    U/S Follow up     The patient returns to the office for followup and review of the noninvasive studies. There have been no interval changes in lower extremity symptoms. No interval shortening of the patient's claudication distance or development of rest pain symptoms. No new ulcers or wounds have occurred since the last visit.  There have been no significant changes to the patient's overall health care.  The patient denies amaurosis fugax or recent TIA symptoms. There are no recent neurological changes noted. The patient denies history of DVT, PE or superficial thrombophlebitis. The patient denies recent episodes of angina or shortness of breath.   ABI Rt=0.98 and Lt=1.17  (previous ABI's Rt=0.95 and Lt=1.04) Duplex ultrasound of the right tibial arteries biphasic/triphasic waveforms with slightly dampened toe waveforms.  The left lower extremity underwent a lower extremity arterial duplex and the patient has biphasic waveforms throughout his left lower extremity with some triphasic waveforms near the distal tibial arteries.  He has strong toe waveforms in the left lower extremity.   Review of Systems  Cardiovascular:       Minimal claudication RLE   All other systems reviewed and are negative.      Objective:   Physical Exam Vitals reviewed.  Constitutional:      Appearance: Normal appearance.  HENT:     Head: Normocephalic.  Cardiovascular:     Rate and Rhythm: Normal rate and regular rhythm.     Pulses: Normal pulses.     Heart sounds: Normal heart sounds.  Pulmonary:     Effort: Pulmonary effort is normal.     Breath sounds: Normal breath sounds.  Neurological:     Mental Status: He is alert and oriented to person, place, and time.  Psychiatric:        Mood and Affect: Mood normal.        Behavior: Behavior normal.          Thought Content: Thought content normal.        Judgment: Judgment normal.     BP 137/66   Pulse 65   Ht 5\' 7"  (1.702 m)   Wt 142 lb (64.4 kg)   BMI 22.24 kg/m   Past Medical History:  Diagnosis Date  . Cancer [facility]) 2006   prostate   . Colon polyps   . Hyperlipidemia   . Hypertension   . Myocardial infarction ([place])   . Prostate CA ([place])   . Smoker   . Squamous acanthoma of external ear, right     Social History   Socioeconomic History  . Marital status: Married    Spouse name: Jana Half  . Number of children: 2  . Years of education: Not on file  . Highest education level: Bachelor's degree (e.g., BA, AB, BS)  Occupational History  . Occupation: retired   Tobacco Use  . Smoking status: Current Every Day Smoker    Packs/day: 1.00    Years: 66.00    Pack years: 66.00    Types: Cigarettes    Start date: 11/18/1952  . Smokeless tobacco: Never Used  . Tobacco comment:      Substance and Sexual Activity  . Alcohol use: Yes    Alcohol/week: 3.0 standard drinks    Types: 3 Cans of beer per week  . Drug use: Never  .  Sexual activity: Not Currently  Other Topics Concern  . Not on file  Social History Narrative  . Not on file   Social Determinants of Health   Financial Resource Strain:   . Difficulty of Paying Living Expenses:   Food Insecurity:   . Worried About Charity fundraiser in the Last Year:   . Arboriculturist in the Last Year:   Transportation Needs:   . Film/video editor (Medical):   Marland Kitchen Lack of Transportation (Non-Medical):   Physical Activity:   . Days of Exercise per Week:   . Minutes of Exercise per Session:   Stress:   . Feeling of Stress :   Social Connections: Unknown  . Frequency of Communication with Friends and Family: More than three times a week  . Frequency of Social Gatherings with Friends and Family: Three times a week  . Attends Religious Services: Never  . Active Member of Clubs or Organizations: No  . Attends Theatre manager Meetings: Never  . Marital Status: Not on file  Intimate Partner Violence:   . Fear of Current or Ex-Partner:   . Emotionally Abused:   Marland Kitchen Physically Abused:   . Sexually Abused:     Past Surgical History:  Procedure Laterality Date  . APPENDECTOMY  1980  . CARDIAC SURGERY    . COLONOSCOPY WITH PROPOFOL N/A 03/02/2018   Procedure: COLONOSCOPY WITH PROPOFOL;  Surgeon: [name], MD;  Location: [place];  Service: Endoscopy;  Laterality: N/A;  . CORONARY ANGIOPLASTY WITH STENT PLACEMENT    . ESOPHAGOGASTRODUODENOSCOPY N/A 11/07/2017   Procedure: ESOPHAGOGASTRODUODENOSCOPY (EGD);  Surgeon: Jules Husbands, MD;  Location: [facility];  Service: General;  Laterality: N/A;  . ESOPHAGOGASTRODUODENOSCOPY (EGD) WITH PROPOFOL  03/02/2018   Procedure: ESOPHAGOGASTRODUODENOSCOPY (EGD) WITH PROPOFOL;  Surgeon: [name], MD;  Location: [place];  Service: Endoscopy;;  . EUS N/A 07/13/2017   Procedure: FULL UPPER ENDOSCOPIC ULTRASOUND (EUS) RADIAL;  Surgeon: Jola Schmidt, MD;  Location: [facility];  Service: Endoscopy;  Laterality: N/A;  . HIGH DOSE RADIATION IMPLANT INSERTION  2006  . LAPAROSCOPIC REMOVAL ABDOMINAL MASS N/A 11/07/2017   Procedure: LAPAROSCOPIC GASTRIC MASS RESECTION;  Surgeon: Jules Husbands, MD;  Location: [facility];  Service: General;  Laterality: N/A;  . LOWER EXTREMITY ANGIOGRAPHY Left 07/04/2018   Procedure: LOWER EXTREMITY ANGIOGRAPHY;  Surgeon: Katha Cabal, MD;  Location: [place] [facility];  Service: Cardiovascular;  Laterality: Left;  . LOWER EXTREMITY ANGIOGRAPHY Left 08/10/2018   Procedure: LOWER EXTREMITY ANGIOGRAPHY;  Surgeon: Katha Cabal, MD;  Location: [place] [facility];  Service: Cardiovascular;  Laterality: Left;  . LOWER EXTREMITY ANGIOGRAPHY Left 09/12/2018   Procedure: LOWER EXTREMITY ANGIOGRAPHY;  Surgeon: Katha Cabal, MD;  Location: [place] [facility];  Service: Cardiovascular;  Laterality: Left;  .  POLYPECTOMY  03/02/2018   Procedure: POLYPECTOMY;  Surgeon: [name], MD;  Location: [place];  Service: Endoscopy;;    Family History  Problem Relation Age of Onset  . Cancer Mother        pancreatic  . Dementia Father   . Cancer Sister        breast   . Aneurysm Brother     Allergies  Allergen Reactions  . Penicillins Nausea Only    Has patient had a PCN reaction causing immediate rash, facial/tongue/throat swelling, SOB or lightheadedness with hypotension: no Has patient had a PCN reaction causing severe rash involving mucus membranes or skin  necrosis: no Has patient had a PCN reaction that required hospitalization: no Has patient had a PCN reaction occurring within the last 10 years: no If all of the above answers are "NO", then may proceed with Cephalosporin use.        Assessment & Plan:   1. Atherosclerosis of native artery of both lower extremities with intermittent claudication (HCC)  Recommend:  The patient has evidence of atherosclerosis of the lower extremities with claudication.  The patient does not voice lifestyle limiting changes at this point in time.  Noninvasive studies do not suggest clinically significant change.  No invasive studies, angiography or surgery at this time The patient should continue walking and begin a more formal exercise program.  The patient should continue antiplatelet therapy and aggressive treatment of the lipid abnormalities  No changes in the patient's medications at this time  The patient should continue wearing graduated compression socks 10-15 mmHg strength to control the mild edema.    2. Current every day smoker Smoking cessation was discussed, 3-10 minutes spent on this topic specifically   3. Essential hypertension Continue antihypertensive medications as already ordered, these medications have been reviewed and there are no changes at this time.   4. Hyperlipidemia, mixed Continue statin as ordered  and reviewed, no changes at this time     Current Outpatient Medications on File Prior to Visit  Medication Sig Dispense Refill  . amLODipine (NORVASC) 10 MG tablet Take 1 tablet (10 mg total) by mouth daily. 90 tablet 1  . aspirin EC 81 MG tablet Take 81 mg by mouth daily.    Marland Kitchen atorvastatin (LIPITOR) 40 MG tablet Take 1 tablet (40 mg total) by mouth daily. 90 tablet 1  . cilostazol (PLETAL) 100 MG tablet TAKE 1 TABLET(100 MG) BY MOUTH TWICE DAILY 180 tablet 1  . clopidogrel (PLAVIX) 75 MG tablet Take 1 tablet (75 mg total) by mouth daily. TAKE 1 TABLET(75 MG) BY MOUTH DAILY 90 tablet 1  . lisinopril (ZESTRIL) 5 MG tablet Take 1 tablet (5 mg total) by mouth daily. 90 tablet 1  . Multiple Vitamins-Minerals (MULTIVITAMIN ADULT PO) Take 1 tablet by mouth daily.     . Vitamin D, Cholecalciferol, 1000 units CAPS Take 1 capsule by mouth daily. (Patient taking differently: Take 2 capsules by mouth daily. ) 60 capsule 3   No current facility-administered medications on file prior to visit.    There are no Patient Instructions on file for this visit. No follow-ups on file.   Kris Hartmann, NP

## 2019-07-09 DIAGNOSIS — R0602 Shortness of breath: Secondary | ICD-10-CM | POA: Diagnosis not present

## 2019-07-09 DIAGNOSIS — I10 Essential (primary) hypertension: Secondary | ICD-10-CM | POA: Diagnosis not present

## 2019-07-09 DIAGNOSIS — I2510 Atherosclerotic heart disease of native coronary artery without angina pectoris: Secondary | ICD-10-CM | POA: Diagnosis not present

## 2019-07-09 DIAGNOSIS — I6523 Occlusion and stenosis of bilateral carotid arteries: Secondary | ICD-10-CM | POA: Diagnosis not present

## 2019-07-09 DIAGNOSIS — I70219 Atherosclerosis of native arteries of extremities with intermittent claudication, unspecified extremity: Secondary | ICD-10-CM | POA: Diagnosis not present

## 2019-07-09 DIAGNOSIS — E782 Mixed hyperlipidemia: Secondary | ICD-10-CM | POA: Diagnosis not present

## 2019-08-12 ENCOUNTER — Ambulatory Visit (INDEPENDENT_AMBULATORY_CARE_PROVIDER_SITE_OTHER): Payer: Medicare Other

## 2019-08-12 ENCOUNTER — Other Ambulatory Visit: Payer: Self-pay

## 2019-08-12 VITALS — BP 118/62 | HR 71 | Resp 16 | Ht 67.0 in | Wt 141.6 lb

## 2019-08-12 DIAGNOSIS — Z7189 Other specified counseling: Secondary | ICD-10-CM

## 2019-08-12 DIAGNOSIS — Z0000 Encounter for general adult medical examination without abnormal findings: Secondary | ICD-10-CM

## 2019-08-12 NOTE — Patient Instructions (Addendum)
Dylan Villanueva , Thank you for taking time to come for your Medicare Wellness Visit. I appreciate your ongoing commitment to your health goals. Please review the following plan we discussed and let me know if I can assist you in the future.   Screening recommendations/referrals: Colonoscopy: done 03/02/18. No longer required.  Recommended yearly ophthalmology/optometry visit for glaucoma screening and checkup Recommended yearly dental visit for hygiene and checkup  Vaccinations: Influenza vaccine: done 11/10/18 Pneumococcal vaccine: done 05/20/19 Tdap vaccine: done 05/16/17 Shingles vaccine: done 05/16/17 & 07/26/17   Covid-19: done 02/28/19 & 03/22/19  Advanced directives: Please bring a copy of your health care power of attorney and living will to the office at your convenience.  Conditions/risks identified: Recommend drinking 6-8 glasses of water per day  Next appointment: Follow up in one year for your annual wellness visit.   Preventive Care 81 Years and Older, Male Preventive care refers to lifestyle choices and visits with your health care provider that can promote health and wellness. What does preventive care include?  A yearly physical exam. This is also called an annual well check.  Dental exams once or twice a year.  Routine eye exams. Ask your health care provider how often you should have your eyes checked.  Personal lifestyle choices, including:  Daily care of your teeth and gums.  Regular physical activity.  Eating a healthy diet.  Avoiding tobacco and drug use.  Limiting alcohol use.  Practicing safe sex.  Taking low doses of aspirin every day.  Taking vitamin and mineral supplements as recommended by your health care provider. What happens during an annual well check? The services and screenings done by your health care provider during your annual well check will depend on your age, overall health, lifestyle risk factors, and family history of disease. Counseling    Your health care provider may ask you questions about your:  Alcohol use.  Tobacco use.  Drug use.  Emotional well-being.  Home and relationship well-being.  Sexual activity.  Eating habits.  History of falls.  Memory and ability to understand (cognition).  Work and work Statistician. Screening  You may have the following tests or measurements:  Height, weight, and BMI.  Blood pressure.  Lipid and cholesterol levels. These may be checked every 5 years, or more frequently if you are over 20 years old.  Skin check.  Lung cancer screening. You may have this screening every year starting at age 81 if you have a 30-pack-year history of smoking and currently smoke or have quit within the past 15 years.  Fecal occult blood test (FOBT) of the stool. You may have this test every year starting at age 4.  Flexible sigmoidoscopy or colonoscopy. You may have a sigmoidoscopy every 5 years or a colonoscopy every 10 years starting at age 38.  Prostate cancer screening. Recommendations will vary depending on your family history and other risks.  Hepatitis C blood test.  Hepatitis B blood test.  Sexually transmitted disease (STD) testing.  Diabetes screening. This is done by checking your blood sugar (glucose) after you have not eaten for a while (fasting). You may have this done every 1-3 years.  Abdominal aortic aneurysm (AAA) screening. You may need this if you are a current or former smoker.  Osteoporosis. You may be screened starting at age 21 if you are at high risk. Talk with your health care provider about your test results, treatment options, and if necessary, the need for more tests. Vaccines  Your  health care provider may recommend certain vaccines, such as:  Influenza vaccine. This is recommended every year.  Tetanus, diphtheria, and acellular pertussis (Tdap, Td) vaccine. You may need a Td booster every 10 years.  Zoster vaccine. You may need this after age  81.  Pneumococcal 13-valent conjugate (PCV13) vaccine. One dose is recommended after age 81.  Pneumococcal polysaccharide (PPSV23) vaccine. One dose is recommended after age 81. Talk to your health care provider about which screenings and vaccines you need and how often you need them. This information is not intended to replace advice given to you by your health care provider. Make sure you discuss any questions you have with your health care provider. Document Released: 02/27/2015 Document Revised: 10/21/2015 Document Reviewed: 12/02/2014 Elsevier Interactive Patient Education  2017 [place] Prevention in the Home Falls can cause injuries. They can happen to people of all ages. There are many things you can do to make your home safe and to help prevent falls. What can I do on the outside of my home?  Regularly fix the edges of walkways and driveways and fix any cracks.  Remove anything that might make you trip as you walk through a door, such as a raised step or threshold.  Trim any bushes or trees on the path to your home.  Use bright outdoor lighting.  Clear any walking paths of anything that might make someone trip, such as rocks or tools.  Regularly check to see if handrails are loose or broken. Make sure that both sides of any steps have handrails.  Any raised decks and porches should have guardrails on the edges.  Have any leaves, snow, or ice cleared regularly.  Use sand or salt on walking paths during winter.  Clean up any spills in your garage right away. This includes oil or grease spills. What can I do in the bathroom?  Use night lights.  Install grab bars by the toilet and in the tub and shower. Do not use towel bars as grab bars.  Use non-skid mats or decals in the tub or shower.  If you need to sit down in the shower, use a plastic, non-slip stool.  Keep the floor dry. Clean up any water that spills on the floor as soon as it happens.  Remove  soap buildup in the tub or shower regularly.  Attach bath mats securely with double-sided non-slip rug tape.  Do not have throw rugs and other things on the floor that can make you trip. What can I do in the bedroom?  Use night lights.  Make sure that you have a light by your bed that is easy to reach.  Do not use any sheets or blankets that are too big for your bed. They should not hang down onto the floor.  Have a firm chair that has side arms. You can use this for support while you get dressed.  Do not have throw rugs and other things on the floor that can make you trip. What can I do in the kitchen?  Clean up any spills right away.  Avoid walking on wet floors.  Keep items that you use a lot in easy-to-reach places.  If you need to reach something above you, use a strong step stool that has a grab bar.  Keep electrical cords out of the way.  Do not use floor polish or wax that makes floors slippery. If you must use wax, use non-skid floor wax.  Do not  have throw rugs and other things on the floor that can make you trip. What can I do with my stairs?  Do not leave any items on the stairs.  Make sure that there are handrails on both sides of the stairs and use them. Fix handrails that are broken or loose. Make sure that handrails are as long as the stairways.  Check any carpeting to make sure that it is firmly attached to the stairs. Fix any carpet that is loose or worn.  Avoid having throw rugs at the top or bottom of the stairs. If you do have throw rugs, attach them to the floor with carpet tape.  Make sure that you have a light switch at the top of the stairs and the bottom of the stairs. If you do not have them, ask someone to add them for you. What else can I do to help prevent falls?  Wear shoes that:  Do not have high heels.  Have rubber bottoms.  Are comfortable and fit you well.  Are closed at the toe. Do not wear sandals.  If you use a  stepladder:  Make sure that it is fully opened. Do not climb a closed stepladder.  Make sure that both sides of the stepladder are locked into place.  Ask someone to hold it for you, if possible.  Clearly mark and make sure that you can see:  Any grab bars or handrails.  First and last steps.  Where the edge of each step is.  Use tools that help you move around (mobility aids) if they are needed. These include:  Canes.  Walkers.  Scooters.  Crutches.  Turn on the lights when you go into a dark area. Replace any light bulbs as soon as they burn out.  Set up your furniture so you have a clear path. Avoid moving your furniture around.  If any of your floors are uneven, fix them.  If there are any pets around you, be aware of where they are.  Review your medicines with your doctor. Some medicines can make you feel dizzy. This can increase your chance of falling. Ask your doctor what other things that you can do to help prevent falls. This information is not intended to replace advice given to you by your health care provider. Make sure you discuss any questions you have with your health care provider. Document Released: 11/27/2008 Document Revised: 07/09/2015 Document Reviewed: 03/07/2014 Elsevier Interactive Patient Education  2017 [employer].

## 2019-08-12 NOTE — Progress Notes (Signed)
Subjective:   Dylan Villanueva is a 79 y.o. male who presents for Medicare Annual/Subsequent preventive examination.  Review of Systems     Cardiac Risk Factors include: advanced age (>28men, >80 women);dyslipidemia;male gender;hypertension;smoking/ tobacco exposure     Objective:    Today's Vitals   08/12/19 1124  BP: 118/62  Pulse: 71  Resp: 16  SpO2: 98%  Weight: 141 lb 9.6 oz (64.2 kg)  Height: 5\' 7"  (1.702 m)   Body mass index is 22.18 kg/m.  Advanced Directives 08/12/2019 09/12/2018 08/10/2018 08/08/2018 07/04/2018 03/02/2018 11/07/2017  Does Patient Have a Medical Advance Directive? Yes Yes Yes Yes Yes Yes Yes  Type of Paramedic of [place];Living will [place];Living will [place];Living will [place];Living will Living will;Healthcare Power of [place];Living will [place]  Does patient want to make changes to medical advance directive? - No - Patient declined No - Patient declined - No - Patient declined No - Patient declined No - Patient declined  Copy of [place] in Chart? No - copy requested No - copy requested No - copy requested No - copy requested No - copy requested No - copy requested -    Current Medications (verified) Outpatient Encounter Medications as of 08/12/2019  Medication Sig  . amLODipine (NORVASC) 10 MG tablet Take 1 tablet (10 mg total) by mouth daily.  Marland Kitchen aspirin EC 81 MG tablet Take 81 mg by mouth daily.  Marland Kitchen atorvastatin (LIPITOR) 40 MG tablet Take 1 tablet (40 mg total) by mouth daily.  . cilostazol (PLETAL) 100 MG tablet TAKE 1 TABLET(100 MG) BY MOUTH TWICE DAILY  . clopidogrel (PLAVIX) 75 MG tablet Take 1 tablet (75 mg total) by mouth daily. TAKE 1 TABLET(75 MG) BY MOUTH DAILY  . lisinopril (ZESTRIL) 5 MG tablet Take 1 tablet (5 mg total) by mouth daily.  . Multiple Vitamins-Minerals (MULTIVITAMIN ADULT  PO) Take 1 tablet by mouth daily.   . Vitamin D, Cholecalciferol, 1000 units CAPS Take 1 capsule by mouth daily. (Patient taking differently: Take 2 capsules by mouth daily. )   No facility-administered encounter medications on file as of 08/12/2019.    Allergies (verified) Penicillins   History: Past Medical History:  Diagnosis Date  . Cancer [facility]) 2006   prostate   . Colon polyps   . Hyperlipidemia   . Hypertension   . Myocardial infarction ([place])   . Prostate CA ([place])   . Smoker   . Squamous acanthoma of external ear, right    Past Surgical History:  Procedure Laterality Date  . APPENDECTOMY  1980  . CARDIAC SURGERY    . COLONOSCOPY WITH PROPOFOL N/A 03/02/2018   Procedure: COLONOSCOPY WITH PROPOFOL;  Surgeon: [name], MD;  Location: [place];  Service: Endoscopy;  Laterality: N/A;  . CORONARY ANGIOPLASTY WITH STENT PLACEMENT    . ESOPHAGOGASTRODUODENOSCOPY N/A 11/07/2017   Procedure: ESOPHAGOGASTRODUODENOSCOPY (EGD);  Surgeon: Jules Husbands, MD;  Location: [facility];  Service: General;  Laterality: N/A;  . ESOPHAGOGASTRODUODENOSCOPY (EGD) WITH PROPOFOL  03/02/2018   Procedure: ESOPHAGOGASTRODUODENOSCOPY (EGD) WITH PROPOFOL;  Surgeon: [name], MD;  Location: [place];  Service: Endoscopy;;  . EUS N/A 07/13/2017   Procedure: FULL UPPER ENDOSCOPIC ULTRASOUND (EUS) RADIAL;  Surgeon: Jola Schmidt, MD;  Location: [facility];  Service: Endoscopy;  Laterality: N/A;  . HIGH DOSE RADIATION IMPLANT INSERTION  2006  . LAPAROSCOPIC REMOVAL ABDOMINAL MASS N/A  11/07/2017   Procedure: LAPAROSCOPIC GASTRIC MASS RESECTION;  Surgeon: Jules Husbands, MD;  Location: [facility];  Service: General;  Laterality: N/A;  . LOWER EXTREMITY ANGIOGRAPHY Left 07/04/2018   Procedure: LOWER EXTREMITY ANGIOGRAPHY;  Surgeon: Katha Cabal, MD;  Location: [place] [facility];  Service: Cardiovascular;  Laterality: Left;  . LOWER EXTREMITY ANGIOGRAPHY Left 08/10/2018    Procedure: LOWER EXTREMITY ANGIOGRAPHY;  Surgeon: Katha Cabal, MD;  Location: [place] [facility];  Service: Cardiovascular;  Laterality: Left;  . LOWER EXTREMITY ANGIOGRAPHY Left 09/12/2018   Procedure: LOWER EXTREMITY ANGIOGRAPHY;  Surgeon: Katha Cabal, MD;  Location: [place] [facility];  Service: Cardiovascular;  Laterality: Left;  . POLYPECTOMY  03/02/2018   Procedure: POLYPECTOMY;  Surgeon: [name], MD;  Location: [place];  Service: Endoscopy;;   Family History  Problem Relation Age of Onset  . Cancer Mother        pancreatic  . Dementia Father   . Cancer Sister        breast   . Aneurysm Brother    Social History   Socioeconomic History  . Marital status: Married    Spouse name: Jana Half  . Number of children: 2  . Years of education: Not on file  . Highest education level: Bachelor's degree (e.g., BA, AB, BS)  Occupational History  . Occupation: retired   Tobacco Use  . Smoking status: Current Every Day Smoker    Packs/day: 1.00    Years: 66.00    Pack years: 66.00    Types: Cigarettes    Start date: 11/18/1952  . Smokeless tobacco: Never Used  . Tobacco comment:      Vaping Use  . Vaping Use: Never used  Substance and Sexual Activity  . Alcohol use: Yes    Alcohol/week: 3.0 standard drinks    Types: 3 Cans of beer per week  . Drug use: Never  . Sexual activity: Not Currently  Other Topics Concern  . Not on file  Social History Narrative  . Not on file   Social Determinants of Health   Financial Resource Strain: Low Risk   . Difficulty of Paying Living Expenses: Not hard at all  Food Insecurity: No Food Insecurity  . Worried About Charity fundraiser in the Last Year: Never true  . Ran Out of Food in the Last Year: Never true  Transportation Needs: No Transportation Needs  . Lack of Transportation (Medical): No  . Lack of Transportation (Non-Medical): No  Physical Activity: Inactive  . Days of Exercise per Week: 0 days  .  Minutes of Exercise per Session: 0 min  Stress: No Stress Concern Present  . Feeling of Stress : Not at all  Social Connections: Moderately Isolated  . Frequency of Communication with Friends and Family: More than three times a week  . Frequency of Social Gatherings with Friends and Family: Three times a week  . Attends Religious Services: Never  . Active Member of Clubs or Organizations: No  . Attends Archivist Meetings: Never  . Marital Status: Married    Tobacco Counseling Ready to quit: Not Answered Counseling given: Not Answered Comment:       Clinical Intake:  Pre-visit preparation completed: Yes  Pain : No/denies pain     BMI - recorded: 22.18 Nutritional Status: BMI of 19-24  Normal Nutritional Risks: None Diabetes: No  How often do you need to have someone help you when you read instructions, pamphlets,  or other written materials from your doctor or pharmacy?: 1 - Never  Interpreter Needed?: No  Information entered by :: Clemetine Marker LPN   Activities of Daily Living In your present state of health, do you have any difficulty performing the following activities: 08/12/2019 09/12/2018  Hearing? Y N  Comment declines hearing aids -  Vision? N N  Difficulty concentrating or making decisions? N N  Walking or climbing stairs? N N  Dressing or bathing? N N  Doing errands, shopping? N -  Preparing Food and eating ? N -  Using the Toilet? N -  In the past six months, have you accidently leaked urine? N -  Do you have problems with loss of bowel control? N -  Managing your Medications? N -  Managing your Finances? N -  Housekeeping or managing your Housekeeping? N -  Some recent data might be hidden    Patient Care Team: Juline Patch, MD as PCP - General (Family Medicine) Corey Skains, MD as Consulting Physician (Cardiology) Hollice Espy, MD as Consulting Physician (Urology)  Indicate any recent Medical Services you may have received  from other than [facility] providers in the past year (date may be approximate).     Assessment:   This is a routine wellness examination for Dylan Villanueva.  Hearing/Vision screen  Hearing Screening   125Hz  250Hz  500Hz  1000Hz  2000Hz  3000Hz  4000Hz  6000Hz  8000Hz   Right ear:           Left ear:           Comments: Pt c/o mild hearing loss; declines hearing aids   Vision Screening Comments: Past due for eye exam; declines referral   Dietary issues and exercise activities discussed: Current Exercise Habits: The patient does not participate in regular exercise at present, Exercise limited by: Other - see comments (vascular conditions)  Goals    . DIET - INCREASE WATER INTAKE     Recommend to drink at least 6-8 8oz glasses of water per day.      Depression Screen PHQ 2/9 Scores 08/12/2019 05/20/2019 08/08/2018 06/08/2018 06/01/2017 05/08/2017  PHQ - 2 Score 0 0 0 0 0 0  PHQ- 9 Score - 0 0 0 0 -    Fall Risk Fall Risk  08/12/2019 05/20/2019 11/19/2018 08/08/2018 03/28/2018  Falls in the past year? 0 0 0 0 0  Number falls in past yr: 0 - 0 0 -  Injury with Fall? 0 - 0 0 -  Risk for fall due to : No Fall Risks - - - -  Risk for fall due to: Comment - - - - -  Follow up Falls prevention discussed Falls evaluation completed - Falls prevention discussed Falls evaluation completed    Any stairs in or around the home? Yes  If so, are there any without handrails? Yes  Home free of loose throw rugs in walkways, pet beds, electrical cords, etc? Yes  Adequate lighting in your home to reduce risk of falls? Yes   ASSISTIVE DEVICES UTILIZED TO PREVENT FALLS:  Life alert? No  Use of a cane, walker or w/c? No  Grab bars in the bathroom? Yes - suction only  Shower chair or bench in shower? No  Elevated toilet seat or a handicapped toilet? Yes   TIMED UP AND GO:  Was the test performed? Yes .  Length of time to ambulate 10 feet: 5 sec.   Gait steady and fast without use of assistive device  Cognitive  Function:  6CIT Screen 08/12/2019 08/08/2018 06/01/2017  What Year? 0 points 0 points 0 points  What month? 0 points 0 points 0 points  What time? 0 points 0 points 0 points  Count back from 20 0 points 0 points 0 points  Months in reverse 0 points 0 points 0 points  Repeat phrase 0 points 0 points 0 points  Total Score 0 0 0    Immunizations Immunization History  Administered Date(s) Administered  . Influenza, High Dose Seasonal PF 01/20/2017, 12/13/2017, 11/10/2018  . PFIZER SARS-COV-2 Vaccination 02/28/2019, 03/22/2019  . Pneumococcal Conjugate-13 05/08/2017  . Pneumococcal Polysaccharide-23 05/20/2019  . Tdap 05/16/2017  . Zoster Recombinat (Shingrix) 05/16/2017, 07/26/2017    TDAP status: Up to date   Flu Vaccine status: Up to date   Pneumococcal vaccine status: Up to date   Covid-19 vaccine status: Completed vaccines  Qualifies for Shingles Vaccine? Yes   Zostavax completed No   Shingrix Completed?: Yes  Screening Tests Health Maintenance  Topic Date Due  . INFLUENZA VACCINE  09/15/2019  . TETANUS/TDAP  05/17/2027  . COVID-19 Vaccine  Completed  . PNA vac Low Risk Adult  Completed    Health Maintenance  There are no preventive care reminders to display for this patient.  Colorectal Cancer Screening: Colorectal 03/02/18. No longer required.   Lung Cancer Screening: (Low Dose CT Chest recommended if Age 39-80 years, 30 pack-year currently smoking OR have quit w/in 15years.) does qualify. Pt declines this screening.   Additional Screening:  Hepatitis C Screening: no longer required  Vision Screening: Recommended annual ophthalmology exams for early detection of glaucoma and other disorders of the eye. Is the patient up to date with their annual eye exam?  No  Who is the provider or what is the name of the office in which the patient attends annual eye exams? Not established If pt is not established with a provider, would they like to be referred to a  provider to establish care? No .   Dental Screening: Recommended annual dental exams for proper oral hygiene  Community Resource Referral / Chronic Care Management: CRR required this visit?  Yes  - pt in need of grab bar installation in bathroom for fall prevention  CCM required this visit?  No      Plan:     I have personally reviewed and noted the following in the patient's chart:   . Medical and social history . Use of alcohol, tobacco or illicit drugs  . Current medications and supplements . Functional ability and status . Nutritional status . Physical activity . Advanced directives . List of other physicians . Hospitalizations, surgeries, and ER visits in previous 12 months . Vitals . Screenings to include cognitive, depression, and falls . Referrals and appointments  In addition, I have reviewed and discussed with patient certain preventive protocols, quality metrics, and best practice recommendations. A written personalized care plan for preventive services as well as general preventive health recommendations were provided to patient.     Clemetine Marker, LPN   09/23/1749   Nurse Notes: pt had stress test last week, scheduled to see cardiology tomorrow for follow up and results.

## 2019-08-20 ENCOUNTER — Other Ambulatory Visit: Payer: Self-pay | Admitting: *Deleted

## 2019-08-20 DIAGNOSIS — Z87448 Personal history of other diseases of urinary system: Secondary | ICD-10-CM

## 2019-08-22 NOTE — Progress Notes (Signed)
08/23/2019 3:58 PM   Dylan Villanueva 03-10-1940 [id number]  Referring provider: Juline Patch, MD [address] [place],   [address] Chief Complaint  Patient presents with  . Prostate Cancer    1year    HPI: Dylan Villanueva is a 79 y.o. male a personal history of prostate cancer and history of gross hematuria who returns today for routine annual follow-up.  He has a personal history of prostate cancer status post XRTand brachytherapy boost in ? 2008.He reports his PSA stayed under 1 and was checked annually.   He does have a personal history of gross hematuria in the setting of antiplatelet therapy.  He underwent evaluation last year in the form of CT urogram and cystoscopy.  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  On cystoscopy, he was noted to have a slight bulbar urethral stricture but otherwise was unremarkable.  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 He had a "touch up" operation.  No gross hematuria today.   He denies feeling dizzy today given  BP of 95/45.  PMH: Past Medical History:  Diagnosis Date  . Cancer [facility]) 2006   prostate   . Colon polyps   . Hyperlipidemia   . Hypertension   . Myocardial infarction ([place])   . Prostate CA ([place])   . Smoker   . Squamous acanthoma of external ear, right     Surgical History: Past Surgical History:  Procedure Laterality Date  . APPENDECTOMY  1980  . CARDIAC SURGERY    . COLONOSCOPY WITH PROPOFOL N/A 03/02/2018   Procedure: COLONOSCOPY WITH PROPOFOL;  Surgeon: [name], MD;  Location: [place];  Service: Endoscopy;  Laterality: N/A;  . CORONARY ANGIOPLASTY WITH STENT PLACEMENT    . ESOPHAGOGASTRODUODENOSCOPY N/A 11/07/2017   Procedure: ESOPHAGOGASTRODUODENOSCOPY (EGD);  Surgeon: Jules Husbands, MD;  Location: [facility];  Service: General;  Laterality: N/A;  .  ESOPHAGOGASTRODUODENOSCOPY (EGD) WITH PROPOFOL  03/02/2018   Procedure: ESOPHAGOGASTRODUODENOSCOPY (EGD) WITH PROPOFOL;  Surgeon: [name], MD;  Location: [place];  Service: Endoscopy;;  . EUS N/A 07/13/2017   Procedure: FULL UPPER ENDOSCOPIC ULTRASOUND (EUS) RADIAL;  Surgeon: Jola Schmidt, MD;  Location: [facility];  Service: Endoscopy;  Laterality: N/A;  . HIGH DOSE RADIATION IMPLANT INSERTION  2006  . LAPAROSCOPIC REMOVAL ABDOMINAL MASS N/A 11/07/2017   Procedure: LAPAROSCOPIC GASTRIC MASS RESECTION;  Surgeon: Jules Husbands, MD;  Location: [facility];  Service: General;  Laterality: N/A;  . LOWER EXTREMITY ANGIOGRAPHY Left 07/04/2018   Procedure: LOWER EXTREMITY ANGIOGRAPHY;  Surgeon: Katha Cabal, MD;  Location: [place] [facility];  Service: Cardiovascular;  Laterality: Left;  . LOWER EXTREMITY ANGIOGRAPHY Left 08/10/2018   Procedure: LOWER EXTREMITY ANGIOGRAPHY;  Surgeon: Katha Cabal, MD;  Location: [place] [facility];  Service: Cardiovascular;  Laterality: Left;  . LOWER EXTREMITY ANGIOGRAPHY Left 09/12/2018   Procedure: LOWER EXTREMITY ANGIOGRAPHY;  Surgeon: Katha Cabal, MD;  Location: [place] [facility];  Service: Cardiovascular;  Laterality: Left;  . POLYPECTOMY  03/02/2018   Procedure: POLYPECTOMY;  Surgeon: [name], MD;  Location: [place];  Service: Endoscopy;;    Home Medications:  Allergies as of 08/23/2019      Reactions   Penicillins Nausea Only   Has patient had a PCN reaction causing immediate rash, facial/tongue/throat swelling, SOB or lightheadedness with hypotension: no Has patient had a PCN  reaction causing severe rash involving mucus membranes or skin necrosis: no Has patient had a PCN reaction that required hospitalization: no Has patient had a PCN reaction occurring within the last 10 years: no If all of the above answers are "NO", then may proceed with Cephalosporin use.      Medication List       Accurate as  of August 23, 2019 11:59 PM. If you have any questions, ask your nurse or doctor.        amLODipine 10 MG tablet Commonly known as: NORVASC Take 1 tablet (10 mg total) by mouth daily.   aspirin EC 81 MG tablet Take 81 mg by mouth daily.   atorvastatin 40 MG tablet Commonly known as: LIPITOR Take 1 tablet (40 mg total) by mouth daily.   cilostazol 100 MG tablet Commonly known as: PLETAL TAKE 1 TABLET(100 MG) BY MOUTH TWICE DAILY   clopidogrel 75 MG tablet Commonly known as: PLAVIX Take 1 tablet (75 mg total) by mouth daily. TAKE 1 TABLET(75 MG) BY MOUTH DAILY   lisinopril 5 MG tablet Commonly known as: ZESTRIL Take 1 tablet (5 mg total) by mouth daily.   MULTIVITAMIN ADULT PO Take 1 tablet by mouth daily.   Vitamin D (Cholecalciferol) 25 MCG (1000 UT) Caps Take 1 capsule by mouth daily. What changed: how much to take       Allergies:  Allergies  Allergen Reactions  . Penicillins Nausea Only    Has patient had a PCN reaction causing immediate rash, facial/tongue/throat swelling, SOB or lightheadedness with hypotension: no Has patient had a PCN reaction causing severe rash involving mucus membranes or skin necrosis: no Has patient had a PCN reaction that required hospitalization: no Has patient had a PCN reaction occurring within the last 10 years: no If all of the above answers are "NO", then may proceed with Cephalosporin use.     Family History: Family History  Problem Relation Age of Onset  . Cancer Mother        pancreatic  . Dementia Father   . Cancer Sister        breast   . Aneurysm Brother   . Bladder Cancer Neg Hx   . Prostate cancer Neg Hx   . Kidney cancer Neg Hx     Social History:  reports that he has been smoking cigarettes. He started smoking about 66 years ago. He has a 66.00 pack-year smoking history. He has never used smokeless tobacco. He reports current alcohol use of about 3.0 standard drinks of alcohol per week. He reports that he does  not use drugs.   Physical Exam: BP (!) 95/45   Pulse 73   Ht 5\' 7"  (1.702 m)   Wt 142 lb (64.4 kg)   BMI 22.24 kg/m   Constitutional:  Alert and oriented, No acute distress. HEENT: [place] AT, moist mucus membranes.  Trachea midline, no masses. Cardiovascular: No clubbing, cyanosis, or edema. Respiratory: Normal respiratory effort, no increased work of breathing. Skin: No rashes, bruises or suspicious lesions. Neurologic: Grossly intact, no focal deficits, moving all 4 extremities. Psychiatric: Normal mood and affect.  Laboratory Data:  Lab Results  Component Value Date   CREATININE 0.86 05/20/2019   Urinalysis UA showed microscopic hematuria  Assessment & Plan:    1. History of prostate cancer No evidence of disease PSA is pending  2. History of hematuria  UA showed microscopic hematuria  Gross hematuria work up done in 2019. No further episodes of gross  hematuria status post extensive work-up.  Given that its been 2 years since his last work-up, I have recommended pursuing at least repeat cystoscopy as well as RUS.  He is agreeable this plan.   Return for cysto 2wk no UA needed.  [place] [address] [place], [place] [id number] [phone]  I, Selena Batten, am acting as a scribe for Dr. Hollice Espy.  I have reviewed the above documentation for accuracy and completeness, and I agree with the above.   Hollice Espy, MD  I spent 30 total minutes on the day of the encounter including pre-visit review of the medical record, face-to-face time with the patient, and post visit ordering of labs/imaging/tests.

## 2019-08-23 ENCOUNTER — Other Ambulatory Visit
Admission: RE | Admit: 2019-08-23 | Discharge: 2019-08-23 | Disposition: A | Discharge status: Home / Self Care | Payer: Medicare Other | Attending: Urology | Admitting: Urology

## 2019-08-23 ENCOUNTER — Ambulatory Visit (INDEPENDENT_AMBULATORY_CARE_PROVIDER_SITE_OTHER): Payer: Medicare Other | Admitting: Urology

## 2019-08-23 ENCOUNTER — Other Ambulatory Visit: Payer: Self-pay

## 2019-08-23 ENCOUNTER — Encounter: Payer: Self-pay | Admitting: Urology

## 2019-08-23 VITALS — BP 95/45 | HR 73 | Ht 67.0 in | Wt 142.0 lb

## 2019-08-23 DIAGNOSIS — Z87448 Personal history of other diseases of urinary system: Secondary | ICD-10-CM | POA: Insufficient documentation

## 2019-08-23 DIAGNOSIS — R3129 Other microscopic hematuria: Secondary | ICD-10-CM | POA: Diagnosis not present

## 2019-08-23 DIAGNOSIS — Z8546 Personal history of malignant neoplasm of prostate: Secondary | ICD-10-CM | POA: Diagnosis present

## 2019-08-23 DIAGNOSIS — I70219 Atherosclerosis of native arteries of extremities with intermittent claudication, unspecified extremity: Secondary | ICD-10-CM

## 2019-08-23 LAB — URINALYSIS, COMPLETE (UACMP) WITH MICROSCOPIC
Bacteria, UA: NONE SEEN
Glucose, UA: NEGATIVE mg/dL
Leukocytes,Ua: NEGATIVE
Nitrite: NEGATIVE
Protein, ur: NEGATIVE mg/dL
Specific Gravity, Urine: 1.02 (ref 1.005–1.030)
pH: 5.5 (ref 5.0–8.0)

## 2019-08-23 LAB — PSA: Prostatic Specific Antigen: 0.02 ng/mL (ref 0.00–4.00)

## 2019-08-23 NOTE — Patient Instructions (Signed)
Cystoscopy Cystoscopy is a procedure that is used to help diagnose and sometimes treat conditions that affect the lower urinary tract. The lower urinary tract includes the bladder and the urethra. The urethra is the tube that drains urine from the bladder. Cystoscopy is done using a thin, tube-shaped instrument with a light and camera at the end (cystoscope). The cystoscope may be hard or flexible, depending on the goal of the procedure. The cystoscope is inserted through the urethra, into the bladder. Cystoscopy may be recommended if you have:  Urinary tract infections that keep coming back.  Blood in the urine (hematuria).  An inability to control when you urinate (urinary incontinence) or an overactive bladder.  Unusual cells found in a urine sample.  A blockage in the urethra, such as a urinary stone.  Painful urination.  An abnormality in the bladder found during an intravenous pyelogram (IVP) or CT scan. Cystoscopy may also be done to remove a sample of tissue to be examined under a microscope (biopsy). What are the risks? Generally, this is a safe procedure. However, problems may occur, including:  Infection.  Bleeding.  What happens during the procedure?  1. You will be given one or more of the following: ? A medicine to numb the area (local anesthetic). 2. The area around the opening of your urethra will be cleaned. 3. The cystoscope will be passed through your urethra into your bladder. 4. Germ-free (sterile) fluid will flow through the cystoscope to fill your bladder. The fluid will stretch your bladder so that your health care provider can clearly examine your bladder walls. 5. Your doctor will look at the urethra and bladder. 6. The cystoscope will be removed The procedure may vary among health care providers  What can I expect after the procedure? After the procedure, it is common to have: 1. Some soreness or pain in your abdomen and urethra. 2. Urinary symptoms.  These include: ? Mild pain or burning when you urinate. Pain should stop within a few minutes after you urinate. This may last for up to 1 week. ? A small amount of blood in your urine for several days. ? Feeling like you need to urinate but producing only a small amount of urine. Follow these instructions at home: General instructions  Return to your normal activities as told by your health care provider.   Do not drive for 24 hours if you were given a sedative during your procedure.  Watch for any blood in your urine. If the amount of blood in your urine increases, call your health care provider.  If a tissue sample was removed for testing (biopsy) during your procedure, it is up to you to get your test results. Ask your health care provider, or the department that is doing the test, when your results will be ready.  Drink enough fluid to keep your urine pale yellow.  Keep all follow-up visits as told by your health care provider. This is important. Contact a health care provider if you:  Have pain that gets worse or does not get better with medicine, especially pain when you urinate.  Have trouble urinating.  Have more blood in your urine. Get help right away if you:  Have blood clots in your urine.  Have abdominal pain.  Have a fever or chills.  Are unable to urinate. Summary  Cystoscopy is a procedure that is used to help diagnose and sometimes treat conditions that affect the lower urinary tract.  Cystoscopy is done using   a thin, tube-shaped instrument with a light and camera at the end.  After the procedure, it is common to have some soreness or pain in your abdomen and urethra.  Watch for any blood in your urine. If the amount of blood in your urine increases, call your health care provider.  If you were prescribed an antibiotic medicine, take it as told by your health care provider. Do not stop taking the antibiotic even if you start to feel better. This  information is not intended to replace advice given to you by your health care provider. Make sure you discuss any questions you have with your health care provider. Document Revised: 01/23/2018 Document Reviewed: 01/23/2018 Elsevier Patient Education  2020 [employer]   

## 2019-08-26 ENCOUNTER — Telehealth: Payer: Self-pay | Admitting: *Deleted

## 2019-08-26 NOTE — Telephone Encounter (Addendum)
  Patient informed, voiced understanding.   ----- Message from Hollice Espy, MD sent at 08/24/2019  3:47 PM EDT ----- PSA is stable.    Hollice Espy, MD

## 2019-08-30 ENCOUNTER — Ambulatory Visit: Payer: Medicare Other | Admitting: Urology

## 2019-09-02 ENCOUNTER — Ambulatory Visit
Admission: RE | Admit: 2019-09-02 | Discharge: 2019-09-02 | Disposition: A | Discharge status: Home / Self Care | Payer: Medicare Other | Source: Ambulatory Visit | Attending: Urology | Admitting: Urology

## 2019-09-02 ENCOUNTER — Other Ambulatory Visit: Payer: Self-pay

## 2019-09-02 DIAGNOSIS — R319 Hematuria, unspecified: Secondary | ICD-10-CM | POA: Diagnosis not present

## 2019-09-02 DIAGNOSIS — N323 Diverticulum of bladder: Secondary | ICD-10-CM | POA: Diagnosis not present

## 2019-09-02 DIAGNOSIS — R3129 Other microscopic hematuria: Secondary | ICD-10-CM | POA: Diagnosis not present

## 2019-09-02 DIAGNOSIS — K5790 Diverticulosis of intestine, part unspecified, without perforation or abscess without bleeding: Secondary | ICD-10-CM | POA: Diagnosis not present

## 2019-09-02 DIAGNOSIS — N3289 Other specified disorders of bladder: Secondary | ICD-10-CM | POA: Diagnosis not present

## 2019-09-05 NOTE — Progress Notes (Signed)
   09/06/2019  CC:  Chief Complaint  Patient presents with  . Cysto    HPI: Dylan Villanueva is a 79 y.o. male a personal history of prostate cancer and history of gross hematuria presents today for a cystoscopy.   He has a personal history of prostate cancer status post XRTand brachytherapy boost in ? 2008.He reports his PSA stayed under 1 and was checked annually.  He does have a personal history of gross hematuria in the setting of antiplatelet therapy. He underwent evaluation last year in the form of CT urogram and cystoscopy. 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 On cystoscopy, he was noted to have a slight bulbar urethral stricture but otherwise was unremarkable.  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 He had a "touch up" operation.  Most recent PSA was 0.02 as of 08/23/2019.  Renal ultrasound on 09/02/2019 revealed normal kidneys bilaterally. Small right bladder diverticulum.    Patient reports that he is voiding well.   PSA trend: Component     Latest Ref Rng & Units 06/16/2017 08/23/2018 08/23/2019  Prostatic Specific Antigen     0.00 - 4.00 ng/mL 0.04 0.03 0.02   Ppx Bactrim given today   Blood pressure (!) 135/75, pulse 99, height 5\' 7"  (1.702 m), weight 138 lb (62.6 kg). NED. A&Ox3.   No respiratory distress   Abd soft, NT, ND Normal phallus with bilateral descended testicles  Cystoscopy Procedure Note  Patient identification was confirmed, informed consent was obtained, and patient was prepped using Betadine solution.  Lidocaine jelly was administered per urethral meatus.     Pre-Procedure: - Inspection reveals a normal caliber ureteral meatus.  Procedure: The flexible cystoscope was introduced without difficulty - There was a tight bulbar urethral strictures/lesions are present not retrievable with scope. There was some difficulty with  wire as I tried to guide it through stricture. Scope was removed and I could not maneuver through urethra.   Post-Procedure: - Patient tolerated the procedure well    Assessment/ Plan:  1. History of prostate cancer PSA was 0.2 as of 08/23/2019  2. 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  Unable to eval bladder today  Discussed urethral dilation in the OR with a cystoscopy since the bladder could not be view today during cysto.  Risk including bleeding, infection, damage to surround stuctures, need for foley x 72 hours, recurrencem etc.  All questison answer  Patient agreed.  Patient was able to void and has a PVR of 17 mL after attempted cysto today.    3. History of hematuria Will re-evaluate further at time above.  Gross hematuria work up done in 2019. No further episode of gross hematuria s/p extensive work-up.  Fransico Him, am acting as a scribe for Dr. Hollice Espy.  I have reviewed the above documentation for accuracy and completeness, and I agree with the above.   Hollice Espy, MD

## 2019-09-05 NOTE — H&P (View-Only) (Signed)
   09/06/2019  CC:  Chief Complaint  Patient presents with  . Cysto    HPI: Dylan Villanueva is a 79 y.o. male a personal history of prostate cancer and history of gross hematuria presents today for a cystoscopy.   He has a personal history of prostate cancer status post XRTand brachytherapy boost in ? 2008.He reports his PSA stayed under 1 and was checked annually.  He does have a personal history of gross hematuria in the setting of antiplatelet therapy. He underwent evaluation last year in the form of CT urogram and cystoscopy. 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 On cystoscopy, he was noted to have a slight bulbar urethral stricture but otherwise was unremarkable.  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 He had a "touch up" operation.  Most recent PSA was 0.02 as of 08/23/2019.  Renal ultrasound on 09/02/2019 revealed normal kidneys bilaterally. Small right bladder diverticulum.    Patient reports that he is voiding well.   PSA trend: Component     Latest Ref Rng & Units 06/16/2017 08/23/2018 08/23/2019  Prostatic Specific Antigen     0.00 - 4.00 ng/mL 0.04 0.03 0.02   Ppx Bactrim given today   Blood pressure (!) 135/75, pulse 99, height 5\' 7"  (1.702 m), weight 138 lb (62.6 kg). NED. A&Ox3.   No respiratory distress   Abd soft, NT, ND Normal phallus with bilateral descended testicles  Cystoscopy Procedure Note  Patient identification was confirmed, informed consent was obtained, and patient was prepped using Betadine solution.  Lidocaine jelly was administered per urethral meatus.     Pre-Procedure: - Inspection reveals a normal caliber ureteral meatus.  Procedure: The flexible cystoscope was introduced without difficulty - There was a tight bulbar urethral strictures/lesions are present not retrievable with scope. There was some difficulty with  wire as I tried to guide it through stricture. Scope was removed and I could not maneuver through urethra.   Post-Procedure: - Patient tolerated the procedure well    Assessment/ Plan:  1. History of prostate cancer PSA was 0.2 as of 08/23/2019  2. 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  Unable to eval bladder today  Discussed urethral dilation in the OR with a cystoscopy since the bladder could not be view today during cysto.  Risk including bleeding, infection, damage to surround stuctures, need for foley x 72 hours, recurrencem etc.  All questison answer  Patient agreed.  Patient was able to void and has a PVR of 17 mL after attempted cysto today.    3. History of hematuria Will re-evaluate further at time above.  Gross hematuria work up done in 2019. No further episode of gross hematuria s/p extensive work-up.  Fransico Him, am acting as a scribe for Dr. Hollice Espy.  I have reviewed the above documentation for accuracy and completeness, and I agree with the above.   Hollice Espy, MD

## 2019-09-06 ENCOUNTER — Encounter: Payer: Self-pay | Admitting: Urology

## 2019-09-06 ENCOUNTER — Ambulatory Visit (INDEPENDENT_AMBULATORY_CARE_PROVIDER_SITE_OTHER): Payer: Medicare Other | Admitting: Urology

## 2019-09-06 ENCOUNTER — Ambulatory Visit: Payer: Self-pay

## 2019-09-06 ENCOUNTER — Other Ambulatory Visit: Payer: Self-pay

## 2019-09-06 VITALS — BP 135/75 | HR 99 | Ht 67.0 in | Wt 138.0 lb

## 2019-09-06 DIAGNOSIS — R3129 Other microscopic hematuria: Secondary | ICD-10-CM

## 2019-09-06 MED ORDER — SULFAMETHOXAZOLE-TRIMETHOPRIM 800-160 MG PO TABS
1.0000 | ORAL_TABLET | Freq: Once | ORAL | Status: AC
  Administered 2019-09-06: 1 via ORAL

## 2019-09-06 NOTE — Telephone Encounter (Signed)
Patient wife called and states that her husband had a procedure today at his urologist.  He was given an antibiotic.  She says they have been home and he developed severe lower abdominal pain and diarrhea and sweats. Patient is with her and denies chest pain. He is weak feeling. He has been able to eat since the procedure. He has no nausea. No blood in stool. Per protocol patient will go to Er for evaluation. NT offered EMS but wife states she will call if needed but agrees to go to ER for evaluation.  Reason for Disposition . [1] SEVERE abdominal pain AND [2] age > 60 years  Answer Assessment - Initial Assessment Questions 1. DIARRHEA SEVERITY: "How bad is the diarrhea?" "How many extra stools have you had in the past 24 hours than normal?"    - NO DIARRHEA (SCALE 0)   - MILD (SCALE 1-3): Few loose or mushy BMs; increase of 1-3 stools over normal daily number of stools; mild increase in ostomy output.   -  MODERATE (SCALE 4-7): Increase of 4-6 stools daily over normal; moderate increase in ostomy output. * SEVERE (SCALE 8-10; OR 'WORST POSSIBLE'): Increase of 7 or more stools daily over normal; moderate increase in ostomy output; incontinence.     Severe pain across lower abdomin 2. ONSET: "When did the diarrhea begin?"      Today after an proceedure 3. BM CONSISTENCY: "How loose or watery is the diarrhea?"      watery 4. VOMITING: "Are you also vomiting?" If Yes, ask: "How many times in the past 24 hours?"     No 5. ABDOMINAL PAIN: "Are you having any abdominal pain?" If Yes, ask: "What does it feel like?" (e.g., crampy, dull, intermittent, constant)     Yes  6. ABDOMINAL PAIN SEVERITY: If present, ask: "How bad is the pain?"  (e.g., Scale 1-10; mild, moderate, or severe)   - MILD (1-3): doesn't interfere with normal activities, abdomen soft and not tender to touch    - MODERATE (4-7): interferes with normal activities or awakens from sleep, tender to touch    - SEVERE (8-10): excruciating  pain, doubled over, unable to do any normal activities       severe 7. ORAL INTAKE: If vomiting, "Have you been able to drink liquids?" "How much fluids have you had in the past 24 hours?"    Yes has had oral intake 8. HYDRATION: "Any signs of dehydration?" (e.g., dry mouth [not just dry lips], too weak to stand, dizziness, new weight loss) "When did you last urinate?"    Weak now Had a procedure today 9. EXPOSURE: "Have you traveled to a foreign country recently?" "Have you been exposed to anyone with diarrhea?" "Could you have eaten any food that was spoiled?"   N/A 10. ANTIBIOTIC USE: "Are you taking antibiotics now or have you taken antibiotics in the past 2 months?"     Ciprofloxin 11. OTHER SYMPTOMS: "Do you have any other symptoms?" (e.g., fever, blood in stool)       No sweats 12. PREGNANCY: "Is there any chance you are pregnant?" "When was your last menstrual period?"       N/A  Protocols used: DIARRHEA-A-AH

## 2019-09-09 NOTE — Telephone Encounter (Signed)
Noted going to ER

## 2019-09-10 ENCOUNTER — Other Ambulatory Visit: Payer: Self-pay | Admitting: Radiology

## 2019-09-10 DIAGNOSIS — Z01818 Encounter for other preprocedural examination: Secondary | ICD-10-CM

## 2019-09-10 DIAGNOSIS — Z01812 Encounter for preprocedural laboratory examination: Secondary | ICD-10-CM

## 2019-09-10 DIAGNOSIS — N390 Urinary tract infection, site not specified: Secondary | ICD-10-CM

## 2019-09-10 DIAGNOSIS — N35919 Unspecified urethral stricture, male, unspecified site: Secondary | ICD-10-CM

## 2019-09-13 ENCOUNTER — Other Ambulatory Visit: Payer: Self-pay

## 2019-09-13 ENCOUNTER — Other Ambulatory Visit
Admission: RE | Admit: 2019-09-13 | Discharge: 2019-09-13 | Disposition: A | Discharge status: Home / Self Care | Payer: Medicare Other | Attending: Urology | Admitting: Urology

## 2019-09-13 ENCOUNTER — Encounter
Admission: RE | Admit: 2019-09-13 | Discharge: 2019-09-13 | Disposition: A | Discharge status: Home / Self Care | Payer: Medicare Other | Source: Ambulatory Visit | Attending: Urology | Admitting: Urology

## 2019-09-13 DIAGNOSIS — Z01812 Encounter for preprocedural laboratory examination: Secondary | ICD-10-CM | POA: Insufficient documentation

## 2019-09-13 DIAGNOSIS — N390 Urinary tract infection, site not specified: Secondary | ICD-10-CM | POA: Insufficient documentation

## 2019-09-13 DIAGNOSIS — R319 Hematuria, unspecified: Secondary | ICD-10-CM | POA: Insufficient documentation

## 2019-09-13 DIAGNOSIS — N35919 Unspecified urethral stricture, male, unspecified site: Secondary | ICD-10-CM | POA: Diagnosis not present

## 2019-09-13 HISTORY — DX: Dyspnea, unspecified: R06.00

## 2019-09-13 HISTORY — DX: Atherosclerotic heart disease of native coronary artery without angina pectoris: I25.10

## 2019-09-13 LAB — URINALYSIS, COMPLETE (UACMP) WITH MICROSCOPIC
Bacteria, UA: NONE SEEN
Glucose, UA: NEGATIVE mg/dL
Ketones, ur: 15 mg/dL — AB
Leukocytes,Ua: NEGATIVE
Nitrite: NEGATIVE
Protein, ur: 30 mg/dL — AB
Specific Gravity, Urine: 1.025 (ref 1.005–1.030)
pH: 5.5 (ref 5.0–8.0)

## 2019-09-13 NOTE — Patient Instructions (Signed)
Your procedure is scheduled on: Monday 09/23/19.  Report to DAY SURGERY DEPARTMENT LOCATED ON 2ND FLOOR MEDICAL MALL ENTRANCE. To find out your arrival time please call [phone] between 1PM - 3PM on Friday 09/20/19.   Remember: Instructions that are not followed completely may result in serious medical risk, up to and including death, or upon the discretion of your surgeon and anesthesiologist your surgery may need to be rescheduled.      __X__ 1. Do not eat food after midnight the night before your procedure.                 No gum chewing or hard candies. You may drink clear liquids up to 2 hours                 before you are scheduled to arrive for your surgery- DO NOT drink clear                 liquids within 2 hours of the start of your surgery.                 Clear Liquids include:  water, apple juice without pulp, clear carbohydrate                 drink such as Clearfast or Gatorade, Black Coffee or Tea (Do not add                 milk or creamer to coffee or tea).   __X__2.  On the morning of surgery brush your teeth with toothpaste and water, you may rinse your mouth with mouthwash if you wish.  Do not swallow any toothpaste or mouthwash.     __X__ 3.  No Alcohol for 24 hours before or after surgery.   __X__ 4.  Do Not Smoke or use e-cigarettes For 24 Hours Prior to Your Surgery.                 Do not use any chewable tobacco products for at least 6 hours prior to                 surgery.   __X__5.  Notify your doctor if there is any change in your medical condition      (cold, fever, infections).      Do NOT wear jewelry, make-up, hairpins, clips or nail polish. Do NOT wear lotions, powders, or perfumes.  Do NOT shave 48 hours prior to surgery. Men may shave face and neck. Do NOT bring valuables to the hospital.     [facility] is not responsible for any belongings or valuables.   Contacts, dentures/partials or body piercings may not be worn into surgery.  Bring a case for your contacts, glasses or hearing aids, a denture cup will be supplied.     Patients discharged the day of surgery will not be allowed to drive home.     __X__ Take these medicines the morning of surgery with A SIP OF WATER:     1. amLODipine (NORVASC)   2. atorvastatin (LIPITOR)     __X__ Stop Blood Thinners: Plavix, Pletal, & Aspirin on Monday 09/16/19 per instructions from Dr. Cherrie Gauze office.  __X__ Stop Anti-inflammatories 7 days before surgery such as Advil, Ibuprofen, Motrin, BC or Goodies Powder, Naprosyn, Naproxen, Aleve, Aspirin, Meloxicam. May take Tylenol if needed for pain or discomfort.    __X__Do not start taking any new herbal supplements or vitamins prior to your procedure.  Wear comfortable clothing (specific to your surgery type) to the hospital.  Plan for stool softeners for home use; pain medications have a tendency to cause constipation. You can also help prevent constipation by eating foods high in fiber such as fruits and vegetables and drinking plenty of fluids as your diet allows.  After surgery, you can prevent lung complications by doing breathing exercises.Take deep breaths and cough every 1-2 hours. Your doctor may order a device called an Incentive Spirometer to help you take deep breaths.  Please call the [facility] Department at [phone] if you have any questions about these instructions

## 2019-09-14 LAB — URINE CULTURE: Culture: NO GROWTH

## 2019-09-16 ENCOUNTER — Encounter
Admission: RE | Admit: 2019-09-16 | Discharge: 2019-09-16 | Disposition: A | Discharge status: Home / Self Care | Payer: Medicare Other | Source: Ambulatory Visit | Attending: Urology | Admitting: Urology

## 2019-09-16 ENCOUNTER — Other Ambulatory Visit: Payer: Self-pay

## 2019-09-16 DIAGNOSIS — I10 Essential (primary) hypertension: Secondary | ICD-10-CM | POA: Insufficient documentation

## 2019-09-16 DIAGNOSIS — Z01818 Encounter for other preprocedural examination: Secondary | ICD-10-CM | POA: Insufficient documentation

## 2019-09-16 LAB — BASIC METABOLIC PANEL
Anion gap: 9 (ref 5–15)
BUN: 14 mg/dL (ref 8–23)
CO2: 27 mmol/L (ref 22–32)
Calcium: 9 mg/dL (ref 8.9–10.3)
Chloride: 103 mmol/L (ref 98–111)
Creatinine, Ser: 1.07 mg/dL (ref 0.61–1.24)
GFR calc Af Amer: >60 mL/min (ref >60)
GFR calc non Af Amer: >60 mL/min (ref >60)
Glucose, Bld: 114 mg/dL — ABNORMAL HIGH (ref 70–99)
Potassium: 3.9 mmol/L (ref 3.5–5.1)
Sodium: 139 mmol/L (ref 135–145)

## 2019-09-16 LAB — CBC
HCT: 37.6 % — ABNORMAL LOW (ref 39.0–52.0)
Hemoglobin: 13.7 g/dL (ref 13.0–17.0)
MCH: 34.9 pg — ABNORMAL HIGH (ref 26.0–34.0)
MCHC: 36.4 g/dL — ABNORMAL HIGH (ref 30.0–36.0)
MCV: 95.9 fL (ref 80.0–100.0)
Platelets: 263 10*3/uL (ref 150–400)
RBC: 3.92 MIL/uL — ABNORMAL LOW (ref 4.22–5.81)
RDW: 12.9 % (ref 11.5–15.5)
WBC: 7.5 10*3/uL (ref 4.0–10.5)
nRBC: 0 % (ref 0.0–0.2)

## 2019-09-16 NOTE — Progress Notes (Signed)
[facility] Perioperative Services  Pre-Admission/Anesthesia Testing Clinical Review  Date: 09/16/19  Patient Demographics:  Name: Dylan Villanueva DOB:   [birth year] MRN:   [id number]  Planned Surgical Procedure(s):    Case: [id number] Date/Time: 09/23/19 1235   Procedures:      CYSTOSCOPY WITH URETHRAL DILATATION (N/A )     CYSTOSCOPY WITH RETROGRADE PYELOGRAM (Bilateral )   Anesthesia type: General   Pre-op diagnosis: urethral stricture   Location: [facility] OR ROOM 10 / [place] [facility]   Surgeons: Hollice Espy, MD     NOTE: Available PAT nursing documentation and vital signs have been reviewed. Clinical nursing staff has updated patient's PMH/PSHx, current medication list, and drug allergies/intolerances to ensure comprehensive history available to assist in medical decision making as it pertains to the aforementioned surgical procedure and anticipated anesthetic course.   Clinical Discussion:  Dylan Villanueva is a 79 y.o. male who is submitted for pre-surgical anesthesia review and clearance prior to him undergoing the above procedure. Patient is a Current Smoker (66 pack years). Pertinent PMH includes: CAD, NSTEMI, HTN, HLD, carotid artery stenosis, DOE, prostate cancer, venous insufficiency.  Patient is followed by cardiology Nehemiah Massed, MD). He was last seen in the cardiology clinic on 08/13/2019; notes reviewed.  Patient doing well overall.  He denies any anginal or anginal equivalent symptoms.  He has chronic exertional shortness of breath that was felt to be multifactorial and directly related to his current smoking, decreased exercise tolerance, and age-related debility.  Functional status documented as being < METS. Blood pressure well controlled on currently prescribed ACEi and CCB therapy.  Patient continues on statin for HLD and overall CVD risk reduction.  Recent PFTs consistent with moderate obstruction, hyperinflation and a supranormal diffusion  capacity.  Lexiscan normal; LVEF 51% (see full results below).  Per cardiology, patient is at the lowest possible cardiovascular risk and may proceed with surgery/procedure as planned. This patient is on daily anticoagulation therapy. He has been instructed on recommendations for holding his ASA, clopidogrel, and cilostazol for 7 days prior to his procedure. The patient has been instructed that his last dose of his anticoagulant will be on 09/16/2019.  He denies previous intra-operative complications with anesthesia. He underwent a general anesthetic course here (ASA III) in 02/2018 with no documented complications.   Vitals with BMI 09/06/2019 08/23/2019 08/12/2019  Height 5\' 7"  5\' 7"  5\' 7"   Weight 138 lbs 142 lbs 141 lbs 10 oz  BMI 21.61 10.62 69.48  Systolic 546 95 270  Diastolic 75 45 62  Pulse 99 73 71    Providers/Specialists:   NOTE: Primary physician provider listed below. Patient may have been seen by APP or partner within same practice.   PROVIDER ROLE LAST Lu Duffel, MD Urology (Surgeon) 09/06/2019  Juline Patch, MD Primary Care Provider 05/20/2019  Nelva Bush, MD Cardiology 08/13/2019   Allergies:  Penicillins and Sulfa antibiotics  Current Home Medications:   No current facility-administered medications for this encounter.   Marland Kitchen amLODipine (NORVASC) 10 MG tablet  . aspirin EC 81 MG tablet  . atorvastatin (LIPITOR) 40 MG tablet  . cilostazol (PLETAL) 100 MG tablet  . clopidogrel (PLAVIX) 75 MG tablet  . lisinopril (ZESTRIL) 5 MG tablet  . Multiple Vitamins-Minerals (MULTIVITAMIN ADULT PO)  . Vitamin D, Cholecalciferol, 1000 units CAPS   History:   Past Medical History:  Diagnosis Date  . Cancer [facility]) 2006   prostate   . Colon polyps   .  Coronary artery disease   . Dyspnea    With exertion climbing up hill  . Hyperlipidemia   . Hypertension   . Myocardial infarction ([place])   . Prostate CA ([place])   . Smoker   . Squamous acanthoma of external ear,  right    Past Surgical History:  Procedure Laterality Date  . APPENDECTOMY  1980  . CARDIAC SURGERY    . COLONOSCOPY WITH PROPOFOL N/A 03/02/2018   Procedure: COLONOSCOPY WITH PROPOFOL;  Surgeon: [name], MD;  Location: [place];  Service: Endoscopy;  Laterality: N/A;  . CORONARY ANGIOPLASTY WITH STENT PLACEMENT    . ESOPHAGOGASTRODUODENOSCOPY N/A 11/07/2017   Procedure: ESOPHAGOGASTRODUODENOSCOPY (EGD);  Surgeon: Jules Husbands, MD;  Location: [facility] ORS;  Service: General;  Laterality: N/A;  . ESOPHAGOGASTRODUODENOSCOPY (EGD) WITH PROPOFOL  03/02/2018   Procedure: ESOPHAGOGASTRODUODENOSCOPY (EGD) WITH PROPOFOL;  Surgeon: [name], MD;  Location: [place];  Service: Endoscopy;;  . EUS N/A 07/13/2017   Procedure: FULL UPPER ENDOSCOPIC ULTRASOUND (EUS) RADIAL;  Surgeon: Jola Schmidt, MD;  Location: [facility] ENDOSCOPY;  Service: Endoscopy;  Laterality: N/A;  . HIGH DOSE RADIATION IMPLANT INSERTION  2006  . LAPAROSCOPIC REMOVAL ABDOMINAL MASS N/A 11/07/2017   Procedure: LAPAROSCOPIC GASTRIC MASS RESECTION;  Surgeon: Jules Husbands, MD;  Location: [facility] ORS;  Service: General;  Laterality: N/A;  . LOWER EXTREMITY ANGIOGRAPHY Left 07/04/2018   Procedure: LOWER EXTREMITY ANGIOGRAPHY;  Surgeon: Katha Cabal, MD;  Location: [place] CV LAB;  Service: Cardiovascular;  Laterality: Left;  . LOWER EXTREMITY ANGIOGRAPHY Left 08/10/2018   Procedure: LOWER EXTREMITY ANGIOGRAPHY;  Surgeon: Katha Cabal, MD;  Location: [place] CV LAB;  Service: Cardiovascular;  Laterality: Left;  . LOWER EXTREMITY ANGIOGRAPHY Left 09/12/2018   Procedure: LOWER EXTREMITY ANGIOGRAPHY;  Surgeon: Katha Cabal, MD;  Location: [address] CV LAB;  Service: Cardiovascular;  Laterality: Left;  . POLYPECTOMY  03/02/2018   Procedure: POLYPECTOMY;  Surgeon: [name], MD;  Location: [place];  Service: Endoscopy;;   Family History  Problem Relation Age of Onset  . Cancer  Mother        pancreatic  . Dementia Father   . Cancer Sister        breast   . Aneurysm Brother   . Bladder Cancer Neg Hx   . Prostate cancer Neg Hx   . Kidney cancer Neg Hx    Social History   Tobacco Use  . Smoking status: Current Every Day Smoker    Packs/day: 1.00    Years: 66.00    Pack years: 66.00    Types: Cigarettes    Start date: 11/18/1952  . Smokeless tobacco: Never Used  . Tobacco comment:      Vaping Use  . Vaping Use: Never used  Substance Use Topics  . Alcohol use: Yes    Alcohol/week: 3.0 standard drinks    Types: 3 Cans of beer per week  . Drug use: Never    Pertinent Clinical Results:  LABS: Labs reviewed: Acceptable for surgery.  Hospital Outpatient Visit on 09/16/2019  Component Date Value Ref Range Status  . Sodium 09/16/2019 139  135 - 145 mmol/L Final  . Potassium 09/16/2019 3.9  3.5 - 5.1 mmol/L Final  . Chloride 09/16/2019 103  98 - 111 mmol/L Final  . CO2 09/16/2019 27  22 - 32 mmol/L Final  . Glucose, Bld 09/16/2019 114* 70 - 99 mg/dL Final   Glucose reference range applies only to samples taken  after fasting for at least 8 hours.  . BUN 09/16/2019 14  8 - 23 mg/dL Final  . Creatinine, Ser 09/16/2019 1.07  0.61 - 1.24 mg/dL Final  . Calcium 09/16/2019 9.0  8.9 - 10.3 mg/dL Final  . GFR calc non Af Amer 09/16/2019 >60  >60 mL/min Final  . GFR calc Af Amer 09/16/2019 >60  >60 mL/min Final  . Anion gap 09/16/2019 9  5 - 15 Final   Performed at [facility], [address] [place], [place] [address]  . WBC 09/16/2019 7.5  4.0 - 10.5 K/uL Final  . RBC 09/16/2019 3.92* 4.22 - 5.81 MIL/uL Final  . Hemoglobin 09/16/2019 13.7  13.0 - 17.0 g/dL Final  . HCT 09/16/2019 37.6* 39 - 52 % Final  . MCV 09/16/2019 95.9  80.0 - 100.0 fL Final  . MCH 09/16/2019 34.9* 26.0 - 34.0 pg Final  . MCHC 09/16/2019 36.4* 30.0 - 36.0 g/dL Final  . RDW 09/16/2019 12.9  11.5 - 15.5 % Final  . Platelets 09/16/2019 263  150 - 400 K/uL Final  . nRBC  09/16/2019 0.0  0.0 - 0.2 % Final   Performed at [facility], [address] [place],  [address]    ECG: Date: 09/16/2019 Time ECG obtained: 1013 AM Rate: 66 bpm Rhythm: SR with PACs Axis (leads I and aVF): Right axis deviation Intervals: PR 158 ms. QTc 400 ms. ST segment and T wave changes: No evidence of ST segment elevation or depression Comparison: When compared to tracing obtained on 08/12/2019, PACs now present.    IMAGING / PROCEDURES: LEXISCAN done on 08/12/2019 1. LVEF 51% 2. Normal Lexiscan infusion EKG 3. Mild segmental LV systolic dysfunction 4. Minimal fixed inferior myocardial fusion defect consistent with previous infarct and/or scar without evidence of myocardial ischemia  PULMONARY FUNCTION TESTING done on 08/02/2019 1. Spirometry is consistent with moderate obstruction  FVC was 3.62 liters, 140% of predicted  FEV1 was 2.37, 119% of predicted  FEV1 ratio was 65  FEF 25-75% liters per second was 60% of predicted 2. Lung volumes consistent with hyperinflation  TLC was 137% of predicted  RV was 137% of predicted 3. Diffusion capacity is supranormal  DLCO was 137% of predicted  DLCO/VA was 104% of predicted  ECHOCARDIOGRAM done on 08/29/2017 1. Normal LV systolic function 2. Normal RV systolic function 3. Mild TR and PR; trivial MR 4. No valvular stenosis 5. LVEF 55%  Impression and Plan:  Dylan Villanueva has been referred for pre-anesthesia review and clearance prior him undergoing the planned anesthetic and procedural courses. Available labs, pertinent testing, and imaging results were personally reviewed by me. This patient has been appropriately cleared by cardiology.  Based on clinical review performed today (09/16/19), barring and significant acute changes in the patient's overall condition, it is anticipated that he will be able to proceed with the planned surgical intervention. Any acute changes in clinical condition may necessitate  his procedure being postponed and/or cancelled. Pre-surgical instructions were reviewed with the patient during his PAT appointment and questions were fielded by PAT clinical staff.  Honor Loh, MSN, APRN, FNP-C, CEN [facility]  Peri-operative Services Nurse Practitioner Phone: [phone] 09/16/19 1:18 PM  NOTE: This note has been prepared using Dragon dictation software. Despite my best ability to proofread, there is always the potential that unintentional transcriptional errors may still occur from this process.

## 2019-09-19 ENCOUNTER — Other Ambulatory Visit
Admission: RE | Admit: 2019-09-19 | Discharge: 2019-09-19 | Disposition: A | Discharge status: Home / Self Care | Payer: Medicare Other | Source: Ambulatory Visit | Attending: Urology | Admitting: Urology

## 2019-09-19 ENCOUNTER — Other Ambulatory Visit: Payer: Self-pay

## 2019-09-19 DIAGNOSIS — Z01812 Encounter for preprocedural laboratory examination: Secondary | ICD-10-CM | POA: Insufficient documentation

## 2019-09-19 DIAGNOSIS — Z20822 Contact with and (suspected) exposure to covid-19: Secondary | ICD-10-CM | POA: Diagnosis not present

## 2019-09-20 ENCOUNTER — Other Ambulatory Visit: Payer: Medicare Other

## 2019-09-20 LAB — SARS CORONAVIRUS 2 (TAT 6-24 HRS): SARS Coronavirus 2: NEGATIVE

## 2019-09-23 ENCOUNTER — Encounter: Payer: Self-pay | Admitting: Urology

## 2019-09-23 ENCOUNTER — Ambulatory Visit: Payer: Medicare Other

## 2019-09-23 ENCOUNTER — Ambulatory Visit: Payer: Medicare Other | Admitting: Urgent Care

## 2019-09-23 ENCOUNTER — Ambulatory Visit
Admission: RE | Admit: 2019-09-23 | Discharge: 2019-09-23 | Disposition: A | Discharge status: Home / Self Care | Payer: Medicare Other | Attending: Urology | Admitting: Urology

## 2019-09-23 ENCOUNTER — Other Ambulatory Visit: Payer: Self-pay

## 2019-09-23 ENCOUNTER — Encounter
Admission: RE | Disposition: A | Discharge status: Home / Self Care | Payer: Self-pay | Source: Home / Self Care | Attending: Urology

## 2019-09-23 DIAGNOSIS — Z8546 Personal history of malignant neoplasm of prostate: Secondary | ICD-10-CM | POA: Diagnosis not present

## 2019-09-23 DIAGNOSIS — R310 Gross hematuria: Secondary | ICD-10-CM | POA: Diagnosis not present

## 2019-09-23 DIAGNOSIS — I739 Peripheral vascular disease, unspecified: Secondary | ICD-10-CM | POA: Insufficient documentation

## 2019-09-23 DIAGNOSIS — I2510 Atherosclerotic heart disease of native coronary artery without angina pectoris: Secondary | ICD-10-CM | POA: Diagnosis not present

## 2019-09-23 DIAGNOSIS — I6529 Occlusion and stenosis of unspecified carotid artery: Secondary | ICD-10-CM | POA: Diagnosis not present

## 2019-09-23 DIAGNOSIS — I252 Old myocardial infarction: Secondary | ICD-10-CM | POA: Insufficient documentation

## 2019-09-23 DIAGNOSIS — F17210 Nicotine dependence, cigarettes, uncomplicated: Secondary | ICD-10-CM | POA: Insufficient documentation

## 2019-09-23 DIAGNOSIS — Z923 Personal history of irradiation: Secondary | ICD-10-CM | POA: Insufficient documentation

## 2019-09-23 DIAGNOSIS — Z955 Presence of coronary angioplasty implant and graft: Secondary | ICD-10-CM | POA: Insufficient documentation

## 2019-09-23 DIAGNOSIS — N4289 Other specified disorders of prostate: Secondary | ICD-10-CM | POA: Insufficient documentation

## 2019-09-23 DIAGNOSIS — I10 Essential (primary) hypertension: Secondary | ICD-10-CM | POA: Diagnosis not present

## 2019-09-23 DIAGNOSIS — C672 Malignant neoplasm of lateral wall of bladder: Secondary | ICD-10-CM | POA: Diagnosis not present

## 2019-09-23 DIAGNOSIS — Z79899 Other long term (current) drug therapy: Secondary | ICD-10-CM | POA: Diagnosis not present

## 2019-09-23 DIAGNOSIS — E785 Hyperlipidemia, unspecified: Secondary | ICD-10-CM | POA: Diagnosis not present

## 2019-09-23 DIAGNOSIS — N35919 Unspecified urethral stricture, male, unspecified site: Secondary | ICD-10-CM | POA: Diagnosis not present

## 2019-09-23 DIAGNOSIS — N35912 Unspecified bulbous urethral stricture, male: Secondary | ICD-10-CM | POA: Diagnosis not present

## 2019-09-23 DIAGNOSIS — D494 Neoplasm of unspecified behavior of bladder: Secondary | ICD-10-CM | POA: Diagnosis not present

## 2019-09-23 DIAGNOSIS — Z7982 Long term (current) use of aspirin: Secondary | ICD-10-CM | POA: Diagnosis not present

## 2019-09-23 DIAGNOSIS — E782 Mixed hyperlipidemia: Secondary | ICD-10-CM | POA: Diagnosis not present

## 2019-09-23 HISTORY — PX: CYSTOSCOPY W/ RETROGRADES: SHX1426

## 2019-09-23 HISTORY — PX: TRANSURETHRAL RESECTION OF BLADDER TUMOR: SHX2575

## 2019-09-23 HISTORY — PX: CYSTOSCOPY WITH URETHRAL DILATATION: SHX5125

## 2019-09-23 SURGERY — CYSTOSCOPY, WITH URETHRAL DILATION
Anesthesia: General | Site: Urethra

## 2019-09-23 MED ORDER — CHLORHEXIDINE GLUCONATE 0.12 % MT SOLN
OROMUCOSAL | Status: AC
  Administered 2019-09-23: 15 mL via OROMUCOSAL
  Filled 2019-09-23: qty 15

## 2019-09-23 MED ORDER — PROPOFOL 10 MG/ML IV BOLUS
INTRAVENOUS | Status: DC | PRN
  Administered 2019-09-23: 100 mg via INTRAVENOUS

## 2019-09-23 MED ORDER — ONDANSETRON HCL 4 MG/2ML IJ SOLN
INTRAMUSCULAR | Status: DC | PRN
  Administered 2019-09-23: 4 mg via INTRAVENOUS

## 2019-09-23 MED ORDER — CIPROFLOXACIN IN D5W 400 MG/200ML IV SOLN
400.0000 mg | INTRAVENOUS | Status: AC
  Administered 2019-09-23: 400 mg via INTRAVENOUS

## 2019-09-23 MED ORDER — LIDOCAINE HCL (CARDIAC) PF 100 MG/5ML IV SOSY
PREFILLED_SYRINGE | INTRAVENOUS | Status: DC | PRN
  Administered 2019-09-23: 60 mg via INTRAVENOUS

## 2019-09-23 MED ORDER — LACTATED RINGERS IV SOLN: INTRAVENOUS | Status: DC

## 2019-09-23 MED ORDER — GLYCOPYRROLATE 0.2 MG/ML IJ SOLN
INTRAMUSCULAR | Status: DC | PRN
Start: 2019-09-23 — End: 2019-09-23
  Administered 2019-09-23: .2 mg via INTRAVENOUS

## 2019-09-23 MED ORDER — FAMOTIDINE 20 MG PO TABS: 20.0000 mg | ORAL_TABLET | Freq: Once | ORAL | Status: AC

## 2019-09-23 MED ORDER — PROPOFOL 10 MG/ML IV BOLUS
INTRAVENOUS | Status: AC
  Filled 2019-09-23: qty 20

## 2019-09-23 MED ORDER — DEXAMETHASONE SODIUM PHOSPHATE 10 MG/ML IJ SOLN
INTRAMUSCULAR | Status: AC
  Filled 2019-09-23: qty 1

## 2019-09-23 MED ORDER — ORAL CARE MOUTH RINSE: 15.0000 mL | Freq: Once | OROMUCOSAL | Status: AC

## 2019-09-23 MED ORDER — FENTANYL CITRATE (PF) 100 MCG/2ML IJ SOLN
INTRAMUSCULAR | Status: DC | PRN
  Administered 2019-09-23 (×2): 25 ug via INTRAVENOUS

## 2019-09-23 MED ORDER — CHLORHEXIDINE GLUCONATE 0.12 % MT SOLN: 15.0000 mL | Freq: Once | OROMUCOSAL | Status: AC

## 2019-09-23 MED ORDER — ONDANSETRON HCL 4 MG/2ML IJ SOLN: 4.0000 mg | Freq: Once | INTRAMUSCULAR | Status: DC | PRN

## 2019-09-23 MED ORDER — HYDROCODONE-ACETAMINOPHEN 5-325 MG PO TABS: 1.0000 | ORAL_TABLET | Freq: Four times a day (QID) | ORAL | 0 refills | Status: DC | PRN

## 2019-09-23 MED ORDER — CIPROFLOXACIN IN D5W 400 MG/200ML IV SOLN
INTRAVENOUS | Status: AC
  Filled 2019-09-23: qty 200

## 2019-09-23 MED ORDER — FAMOTIDINE 20 MG PO TABS
ORAL_TABLET | ORAL | Status: AC
  Administered 2019-09-23: 20 mg via ORAL
  Filled 2019-09-23: qty 1

## 2019-09-23 MED ORDER — DEXAMETHASONE SODIUM PHOSPHATE 10 MG/ML IJ SOLN
INTRAMUSCULAR | Status: DC | PRN
  Administered 2019-09-23: 10 mg via INTRAVENOUS

## 2019-09-23 MED ORDER — GLYCOPYRROLATE 0.2 MG/ML IJ SOLN
INTRAMUSCULAR | Status: AC
  Filled 2019-09-23: qty 1

## 2019-09-23 MED ORDER — EPHEDRINE SULFATE 50 MG/ML IJ SOLN
INTRAMUSCULAR | Status: DC | PRN
  Administered 2019-09-23: 10 mg via INTRAVENOUS

## 2019-09-23 MED ORDER — IOHEXOL 180 MG/ML  SOLN
INTRAMUSCULAR | Status: DC | PRN
  Administered 2019-09-23: 20 mL

## 2019-09-23 MED ORDER — LIDOCAINE HCL (PF) 2 % IJ SOLN
INTRAMUSCULAR | Status: AC
  Filled 2019-09-23: qty 5

## 2019-09-23 MED ORDER — FENTANYL CITRATE (PF) 100 MCG/2ML IJ SOLN: 25.0000 ug | INTRAMUSCULAR | Status: DC | PRN

## 2019-09-23 MED ORDER — FENTANYL CITRATE (PF) 100 MCG/2ML IJ SOLN
INTRAMUSCULAR | Status: AC
  Filled 2019-09-23: qty 2

## 2019-09-23 MED ORDER — EPHEDRINE 5 MG/ML INJ
INTRAVENOUS | Status: AC
  Filled 2019-09-23: qty 10

## 2019-09-23 MED ORDER — ONDANSETRON HCL 4 MG/2ML IJ SOLN
INTRAMUSCULAR | Status: AC
  Filled 2019-09-23: qty 2

## 2019-09-23 SURGICAL SUPPLY — 26 items
BAG DRAIN CYSTO-URO LG1000N (MISCELLANEOUS) ×5 IMPLANT
BAG DRN RND TRDRP ANRFLXCHMBR (UROLOGICAL SUPPLIES) ×3
BAG URINE DRAIN 2000ML AR STRL (UROLOGICAL SUPPLIES) ×5 IMPLANT
BRUSH SCRUB EZ 1% IODOPHOR (MISCELLANEOUS) ×5 IMPLANT
CATH FOL 2WAY LX 16X5 (CATHETERS) IMPLANT
CATH FOL 2WAY LX 18X30 (CATHETERS) IMPLANT
CATH URETHRAL DIL 7.0X29 (CATHETERS) ×5 IMPLANT
CATH URETL 5X70 OPEN END (CATHETERS) ×5 IMPLANT
DRAPE UTILITY 15X26 TOWEL STRL (DRAPES) ×5 IMPLANT
ELECT REM PT RETURN 9FT ADLT (ELECTROSURGICAL)
ELECTRODE REM PT RTRN 9FT ADLT (ELECTROSURGICAL) IMPLANT
GLOVE BIO SURGEON STRL SZ 6.5 (GLOVE) ×4 IMPLANT
GLOVE BIO SURGEONS STRL SZ 6.5 (GLOVE) ×1
GOWN STRL REUS W/ TWL LRG LVL3 (GOWN DISPOSABLE) ×6 IMPLANT
GOWN STRL REUS W/TWL LRG LVL3 (GOWN DISPOSABLE) ×10
GUIDEWIRE STR DUAL SENSOR (WIRE) ×5 IMPLANT
KIT TURNOVER CYSTO (KITS) ×5 IMPLANT
PACK CYSTO AR (MISCELLANEOUS) ×5 IMPLANT
SET CYSTO W/LG BORE CLAMP LF (SET/KITS/TRAYS/PACK) ×5 IMPLANT
SOL .9 NS 3000ML IRR  AL (IV SOLUTION) ×2
SOL .9 NS 3000ML IRR AL (IV SOLUTION) ×3
SOL .9 NS 3000ML IRR UROMATIC (IV SOLUTION) ×3 IMPLANT
SURGILUBE 2OZ TUBE FLIPTOP (MISCELLANEOUS) ×5 IMPLANT
SYR 10ML LL (SYRINGE) ×5 IMPLANT
SYR 30ML LL (SYRINGE) ×5 IMPLANT
WATER STERILE IRR 1000ML POUR (IV SOLUTION) ×5 IMPLANT

## 2019-09-23 NOTE — Transfer of Care (Signed)
Immediate Anesthesia Transfer of Care Note  Patient: Ibrahem Volkman  Procedure(s) Performed: CYSTOSCOPY WITH URETHRAL DILATATION (N/A Urethra) CYSTOSCOPY WITH RETROGRADE PYELOGRAM (Bilateral Ureter) TRANSURETHRAL RESECTION OF BLADDER TUMOR (TURBT) (N/A Bladder)  Patient Location: PACU  Anesthesia Type:General  Level of Consciousness: drowsy  Airway & Oxygen Therapy: Patient Spontanous Breathing and Patient connected to face mask oxygen  Post-op Assessment: Report given to RN  Post vital signs: stable  Last Vitals:  Vitals Value Taken Time  BP    Temp    Pulse    Resp    SpO2      Last Pain:  Vitals:   09/23/19 0921  TempSrc: Tympanic  PainSc: 0-No pain      Patients Stated Pain Goal: 0 (35/52/17 4715)  Complications: No complications documented.

## 2019-09-23 NOTE — Discharge Instructions (Signed)
AMBULATORY SURGERY  DISCHARGE INSTRUCTIONS   1) The drugs that you were given will stay in your system until tomorrow so for the next 24 hours you should not:  A) Drive an automobile B) Make any legal decisions C) Drink any alcoholic beverage   2) You may resume regular meals tomorrow.  Today it is better to start with liquids and gradually work up to solid foods.  You may eat anything you prefer, but it is better to start with liquids, then soup and crackers, and gradually work up to solid foods.   3) Please notify your doctor immediately if you have any unusual bleeding, trouble breathing, redness and pain at the surgery site, drainage, fever, or pain not relieved by medication.    4) Additional Instructions:        Please contact your physician with any problems or Same Day Surgery at [phone], Monday through Friday 6 am to 4 pm, or [place] at [facility] number at [phone].AMBULATORY SURGERY  DISCHARGE INSTRUCTIONS   5) The drugs that you were given will stay in your system until tomorrow so for the next 24 hours you should not:  D) Drive an automobile E) Make any legal decisions F) Drink any alcoholic beverage   6) You may resume regular meals tomorrow.  Today it is better to start with liquids and gradually work up to solid foods.  You may eat anything you prefer, but it is better to start with liquids, then soup and crackers, and gradually work up to solid foods.   7) Please notify your doctor immediately if you have any unusual bleeding, trouble breathing, redness and pain at the surgery site, drainage, fever, or pain not relieved by medication.    8) Additional Instructions:        Please contact your physician with any problems or Same Day Surgery at [phone], Monday through Friday 6 am to 4 pm, or [place] at [facility] number at [phone].Transurethral Resection of Bladder Tumor (TURBT) or Bladder  Biopsy   Definition:  Transurethral Resection of the Bladder Tumor is a surgical procedure used to diagnose and remove tumors within the bladder. TURBT is the most common treatment for early stage bladder cancer.  General instructions:     Your recent bladder surgery requires very little post hospital care but some definite precautions.  Despite the fact that no skin incisions were used, the area around the bladder incisions are raw and covered with scabs to promote healing and prevent bleeding. Certain precautions are needed to insure that the scabs are not disturbed over the next 2-4 weeks while the healing proceeds.  Because the raw surface inside your bladder and the irritating effects of urine you may expect frequency of urination and/or urgency (a stronger desire to urinate) and perhaps even getting up at night more often. This will usually resolve or improve slowly over the healing period. You may see some blood in your urine over the first 6 weeks. Do not be alarmed, even if the urine was clear for a while. Get off your feet and drink lots of fluids until clearing occurs. If you start to pass clots or don't improve call us.  Diet:  You may return to your normal diet immediately. Because of the raw surface of your bladder, alcohol, spicy foods, foods high in acid and drinks with caffeine may cause irritation or frequency and should be used in moderation. To keep your urine flowing freely and avoid constipation, drink plenty  of fluids during the day (8-10 glasses). Tip: Avoid cranberry juice because it is very acidic.  Activity:  Your physical activity doesn't need to be restricted. However, if you are very active, you may see some blood in the urine. We suggest that you reduce your activity under the circumstances until the bleeding has stopped.  Bowels:  It is important to keep your bowels regular during the postoperative period. Straining with bowel movements can cause bleeding. A  bowel movement every other day is reasonable. Use a mild laxative if needed, such as milk of magnesia 2-3 tablespoons, or 2 Dulcolax tablets. Call if you continue to have problems. If you had been taking narcotics for pain, before, during or after your surgery, you may be constipated. Take a laxative if necessary.    Medication:  You should resume your pre-surgery medications unless told not to. In addition you may be given an antibiotic to prevent or treat infection. Antibiotics are not always necessary. All medication should be taken as prescribed until the bottles are finished unless you are having an unusual reaction to one of the drugs.   [place], [place] [address] [phone]     Indwelling Urinary Catheter Care, Adult An indwelling urinary catheter is a thin tube that is put into your bladder. The tube helps to drain pee (urine) out of your body. The tube goes in through your urethra. Your urethra is where pee comes out of your body. Your pee will come out through the catheter, then it will go into a bag (drainage bag). Take good care of your catheter so it will work well. How to wear your catheter and bag Supplies needed  Sticky tape (adhesive tape) or a leg strap.  Alcohol wipe or soap and water (if you use tape).  A clean towel (if you use tape).  Large overnight bag.  Smaller bag (leg bag). Wearing your catheter Attach your catheter to your leg with tape or a leg strap.  Make sure the catheter is not pulled tight.  If a leg strap gets wet, take it off and put on a dry strap.  If you use tape to hold the bag on your leg: 1. Use an alcohol wipe or soap and water to wash your skin where the tape made it sticky before. 2. Use a clean towel to pat-dry that skin. 3. Use new tape to make the bag stay on your leg. Wearing your bags You should have been given a large overnight bag.  You may wear the overnight bag in the day or  night.  Always have the overnight bag lower than your bladder.  Do not let the bag touch the floor.  Before you go to sleep, put a clean plastic bag in a wastebasket. Then hang the overnight bag inside the wastebasket. You should also have a smaller leg bag that fits under your clothes.  Always wear the leg bag below your knee.  Do not wear your leg bag at night. How to care for your skin and catheter Supplies needed  A clean washcloth.  Water and mild soap.  A clean towel. Caring for your skin and catheter      Clean the skin around your catheter every day: 1. Wash your hands with soap and water. 2. Wet a clean washcloth in warm water and mild soap. 3. Clean the skin around your urethra.  If you are male:  Gently spread the folds of skin around your vagina (labia).  With the washcloth in your other hand, wipe the inner side of your labia on each side. Wipe from front to back.  If you are male:  Pull back any skin that covers the end of your penis (foreskin).  With the washcloth in your other hand, wipe your penis in small circles. Start wiping at the tip of your penis, then move away from the catheter.  Move the foreskin back in place, if needed. 4. With your free hand, hold the catheter close to where it goes into your body.  Keep holding the catheter during cleaning so it does not get pulled out. 5. With the washcloth in your other hand, clean the catheter.  Only wipe downward on the catheter.  Do not wipe upward toward your body. Doing this may push germs into your urethra and cause infection. 6. Use a clean towel to pat-dry the catheter and the skin around it. Make sure to wipe off all soap. 7. Wash your hands with soap and water.  Shower every day. Do not take baths.  Do not use cream, ointment, or lotion on the area where the catheter goes into your body, unless your doctor tells you to.  Do not use powders, sprays, or lotions on your genital  area.  Check your skin around the catheter every day for signs of infection. Check for: ? Redness, swelling, or pain. ? Fluid or blood. ? Warmth. ? Pus or a bad smell. How to empty the bag Supplies needed  Rubbing alcohol.  Gauze pad or cotton ball.  Tape or a leg strap. Emptying the bag Pour the pee out of your bag when it is ?- full, or at least 2-3 times a day. Do this for your overnight bag and your leg bag. 1. Wash your hands with soap and water. 2. Separate (detach) the bag from your leg. 3. Hold the bag over the toilet or a clean pail. Keep the bag lower than your hips and bladder. This is so the pee (urine) does not go back into the tube. 4. Open the pour spout. It is at the bottom of the bag. 5. Empty the pee into the toilet or pail. Do not let the pour spout touch any surface. 6. Put rubbing alcohol on a gauze pad or cotton ball. 7. Use the gauze pad or cotton ball to clean the pour spout. 8. Close the pour spout. 9. Attach the bag to your leg with tape or a leg strap. 10. Wash your hands with soap and water. Follow instructions for cleaning the drainage bag:  From the product maker.  As told by your doctor. How to change the bag Supplies needed  Alcohol wipes.  A clean bag.  Tape or a leg strap. Changing the bag Replace your bag when it starts to leak, smell bad, or look dirty. 1. Wash your hands with soap and water. 2. Separate the dirty bag from your leg. 3. Pinch the catheter with your fingers so that pee does not spill out. 4. Separate the catheter tube from the bag tube where these tubes connect (at the connection valve). Do not let the tubes touch any surface. 5. Clean the end of the catheter tube with an alcohol wipe. Use a different alcohol wipe to clean the end of the bag tube. 6. Connect the catheter tube to the tube of the clean bag. 7. Attach the clean bag to your leg with tape or a leg strap. Do not make the bag tight  on your leg. 8. Wash your  hands with soap and water. General rules   Never pull on your catheter. Never try to take it out. Doing that can hurt you.  Always wash your hands before and after you touch your catheter or bag. Use a mild, fragrance-free soap. If you do not have soap and water, use hand sanitizer.  Always make sure there are no twists or bends (kinks) in the catheter tube.  Always make sure there are no leaks in the catheter or bag.  Drink enough fluid to keep your pee pale yellow.  Do not take baths, swim, or use a hot tub.  If you are male, wipe from front to back after you poop (have a bowel movement). Contact a doctor if:  Your pee is cloudy.  Your pee smells worse than usual.  Your catheter gets clogged.  Your catheter leaks.  Your bladder feels full. Get help right away if:  You have redness, swelling, or pain where the catheter goes into your body.  You have fluid, blood, pus, or a bad smell coming from the area where the catheter goes into your body.  Your skin feels warm where the catheter goes into your body.  You have a fever.  You have pain in your: ? Belly (abdomen). ? Legs. ? Lower back. ? Bladder.  You see blood in the catheter.  Your pee is pink or red.  You feel sick to your stomach (nauseous).  You throw up (vomit).  You have chills.  Your pee is not draining into the bag.  Your catheter gets pulled out. Summary  An indwelling urinary catheter is a thin tube that is placed into the bladder to help drain pee (urine) out of the body.  The catheter is placed into the part of the body that drains pee from the bladder (urethra).  Taking good care of your catheter will keep it working properly and help prevent problems.  Always wash your hands before and after touching your catheter or bag.  Never pull on your catheter or try to take it out. This information is not intended to replace advice given to you by your health care provider. Make sure you  discuss any questions you have with your health care provider. Document Revised: 05/25/2018 Document Reviewed: 09/16/2016 Elsevier Patient Education  [place].

## 2019-09-23 NOTE — Op Note (Signed)
Date of procedure: 09/23/19  Preoperative diagnosis:  1. Gross hematuria 2. Urethral stricture  Postoperative diagnosis:  1. Gross hematuria 2. Urethral and prostatic fossa stricture 3. Bladder tumor, small, left bladder neck  Procedure: 1. Cystoscopy with urethral dilation 2. Bilateral retrograde pyelogram 3. TURBT, small  Surgeon: Hollice Espy, MD  Anesthesia: General  Complications: None  Intraoperative findings: Mild bulbar urethral stricture with more significant stricturing/narrowing of the prostatic fossa with radiation changes.  Trabeculated bladder.  Low-grade appearing papillary tumor measuring less than 1 cm on the left bladder neck.  Bilateral retrograde pyelogram unremarkable.  EBL: Minimal  Specimens: Left bladder neck tumor  Drains: 39 French council tip catheter  Indication: Dylan Villanueva is a 79 y.o. patient with personal history of gross hematuria, prostate cancer status post radiation who underwent attempted office cystoscopy however had a bulbar urethral stricture was not able to accommodate a 16 French flexible scope.  After reviewing the management options for treatment, he elected to proceed with the above surgical procedure(s). We have discussed the potential benefits and risks of the procedure, side effects of the proposed treatment, the likelihood of the patient achieving the goals of the procedure, and any potential problems that might occur during the procedure or recuperation. Informed consent has been obtained.  Description of procedure:  The patient was taken to the operating room and general anesthesia was induced.  The patient was placed in the dorsal lithotomy position, prepped and draped in the usual sterile fashion, and preoperative antibiotics were administered. A preoperative time-out was performed.   A 21 French scope was advanced per urethra to the bulbar urethra where a mild bulbar urethral stricture was identified.  This appeared to be  relatively wispy and the other side of the stricture could be easily seen.  I placed a 5 [place] open-ended ureteral catheter just beyond the strictured area I was able to advance my scope easily through the strictured area without need for further dilation.  However, upon entering the prostatic fossa, this was very tight was also somewhat strictured with radiation type changes of the prostatic fossa.  This portion of the urethra is also relatively fixed.  I ended up advancing a wire through this area which advanced easily into the bladder in order to advance my scope through this narrowed area.  Ultimately I was able to advance the scope into the bladder with some mild difficulty.  Upon entering the bladder, the bladder was noted to be mildly trabeculated.  On the left lateral bladder neck, there was some low-grade papillary tumor which was carpeting-like in appearance.  It measured approximately 8 mm in largest diameter.  The remainder the bladder was carefully inspected and there were no other stones, tumors, lesions or ulcerations.  Attention was first turned to the left ureteral orifice was cannulated using a 5 [place] open-ended ureteral catheter.  Gentle retrograde pyelogram was performed on this side.  This revealed no hydroureteronephrosis or filling defects.  This drained promptly.  The same exact procedure was performed on the right side which is also unremarkable without hydroureteronephrosis or filling defects.  Finally, the tumor was resected and a piece wise fashion using cold cup biopsy forceps.  The base of this tumor was then fulgurated using Bugbee electrocautery.  Hemostasis was excellent.  Upon removing the scope, I left a sensor wire in place.  Unfortunately upon removing the scope, the wire was also inadvertently removed.  I attempted to place a Foley catheter without a wire unsuccessfully.  I  reinserted the scope and then was able to insert the wire under direct visualization through the  prostatic fossa.  He was then able to advance a 32 [place] council tip over the wire into the bladder.  The bladder was then drained and the balloon was filled with 10 cc of sterile water.  The patient was then cleaned and dried, repositioned in supine position, reversed myesthesia, taken to PACU in stable condition.  Plan: We will have the patient return in 3 days for pathology review as well as Foley catheter removal.  He is aware of the risk of restricture of this area.  I suspect his pathology will be low-grade and superficial but we reviewed this in detail.  He would likely not need any additional treatments but will need close surveillance depending on the final pathology.  Hollice Espy, M.D.

## 2019-09-23 NOTE — Anesthesia Procedure Notes (Signed)
Procedure Name: LMA Insertion Date/Time: 09/23/2019 11:17 AM Performed by: Eben Burow, CRNA Pre-anesthesia Checklist: Patient identified, Emergency Drugs available, Suction available and Patient being monitored Patient Re-evaluated:Patient Re-evaluated prior to induction Oxygen Delivery Method: Circle system utilized Preoxygenation: Pre-oxygenation with 100% oxygen Induction Type: IV induction LMA: LMA inserted LMA Size: 4.5 Number of attempts: 1 Placement Confirmation: positive ETCO2 and breath sounds checked- equal and bilateral Tube secured with: Tape Dental Injury: Teeth and Oropharynx as per pre-operative assessment

## 2019-09-23 NOTE — Interval H&P Note (Signed)
History and Physical Interval Note:  09/23/2019 10:51 AM  Dylan Villanueva  has presented today for surgery, with the diagnosis of urethral stricture.  The various methods of treatment have been discussed with the patient and family. After consideration of risks, benefits and other options for treatment, the patient has consented to  Procedure(s): CYSTOSCOPY WITH URETHRAL DILATATION (N/A) CYSTOSCOPY WITH RETROGRADE PYELOGRAM (Bilateral) as a surgical intervention.  The patient's history has been reviewed, patient examined, no change in status, stable for surgery.  I have reviewed the patient's chart and labs.  Questions were answered to the patient's satisfaction.    RRR CTAB  UCx negative  Hollice Espy

## 2019-09-23 NOTE — Anesthesia Preprocedure Evaluation (Signed)
Anesthesia Evaluation  Patient identified by MRN, date of birth, ID band Patient awake    Reviewed: Allergy & Precautions, NPO status , Patient's Chart, lab work & pertinent test results  History of Anesthesia Complications Negative for: history of anesthetic complications  Airway Mallampati: II  TM Distance: >3 FB Neck ROM: Full    Dental  (+) Edentulous Upper, Poor Dentition   Pulmonary neg sleep apnea, neg COPD, Current SmokerPatient did not abstain from smoking.,    breath sounds clear to auscultation- rhonchi (-) wheezing      Cardiovascular hypertension, + CAD, + Past MI, + Cardiac Stents and + Peripheral Vascular Disease   Rhythm:Regular Rate:Normal - Systolic murmurs and - Diastolic murmurs NM stress test 08/12/19: Normal Lexiscan infusion EKG  Mild segmental LV systolic dysfunction  Minimal fixed inferior myocardial fusion defect consistent previous  infarct and/or scar without evidence of myocardial ischemia     Neuro/Psych neg Seizures negative neurological ROS  negative psych ROS   GI/Hepatic negative GI ROS, Neg liver ROS,   Endo/Other  negative endocrine ROSneg diabetes  Renal/GU negative Renal ROS     Musculoskeletal negative musculoskeletal ROS (+)   Abdominal (+) - obese,   Peds  Hematology negative hematology ROS (+)   Anesthesia Other Findings Past Medical History: 2006: Cancer ([place])     Comment:  prostate  No date: Colon polyps No date: Coronary artery disease No date: Dyspnea     Comment:  With exertion climbing up hill No date: Hyperlipidemia No date: Hypertension No date: Myocardial infarction ([place]) No date: Prostate CA ([place]) No date: Smoker 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, right   Reproductive/Obstetrics                             Anesthesia Physical Anesthesia Plan  ASA: III  Anesthesia Plan: General   Post-op Pain Management:     Induction: Intravenous  PONV Risk Score and Plan: 0 and Ondansetron  Airway Management Planned: LMA  Additional Equipment:   Intra-op Plan:   Post-operative Plan:   Informed Consent: I have reviewed the patients History and Physical, chart, labs and discussed the procedure including the risks, benefits and alternatives for the proposed anesthesia with the patient or authorized representative who has indicated his/her understanding and acceptance.     Dental advisory given  Plan Discussed with: CRNA and Anesthesiologist  Anesthesia Plan Comments:         Anesthesia Quick Evaluation

## 2019-09-24 ENCOUNTER — Encounter: Payer: Self-pay | Admitting: Urology

## 2019-09-24 LAB — SURGICAL PATHOLOGY

## 2019-09-24 NOTE — Anesthesia Postprocedure Evaluation (Signed)
Anesthesia Post Note  Patient: Meyer Arora  Procedure(s) Performed: CYSTOSCOPY WITH URETHRAL DILATATION (N/A Urethra) CYSTOSCOPY WITH RETROGRADE PYELOGRAM (Bilateral Ureter) TRANSURETHRAL RESECTION OF BLADDER TUMOR (TURBT) (N/A Bladder)  Patient location during evaluation: PACU Anesthesia Type: General Level of consciousness: awake and alert and oriented Pain management: pain level controlled Vital Signs Assessment: post-procedure vital signs reviewed and stable Respiratory status: spontaneous breathing, nonlabored ventilation and respiratory function stable Cardiovascular status: blood pressure returned to baseline and stable Postop Assessment: no signs of nausea or vomiting Anesthetic complications: no   No complications documented.   Last Vitals:  Vitals:   09/23/19 1234 09/23/19 1246  BP: 120/63 121/68  Pulse: (!) 57 63  Resp:  18  Temp: (!) 36.1 C (!) 36.1 C  SpO2: 99% 99%    Last Pain:  Vitals:   09/24/19 0851  TempSrc:   PainSc: 0-No pain                 Josslynn Mentzer

## 2019-09-25 ENCOUNTER — Ambulatory Visit: Payer: Medicare Other | Admitting: Physician Assistant

## 2019-09-26 ENCOUNTER — Other Ambulatory Visit: Payer: Self-pay

## 2019-09-26 ENCOUNTER — Ambulatory Visit (INDEPENDENT_AMBULATORY_CARE_PROVIDER_SITE_OTHER): Payer: Medicare Other | Admitting: Physician Assistant

## 2019-09-26 ENCOUNTER — Encounter: Payer: Self-pay | Admitting: Physician Assistant

## 2019-09-26 VITALS — BP 130/68 | HR 81

## 2019-09-26 DIAGNOSIS — N35919 Unspecified urethral stricture, male, unspecified site: Secondary | ICD-10-CM

## 2019-09-26 DIAGNOSIS — C675 Malignant neoplasm of bladder neck: Secondary | ICD-10-CM

## 2019-09-26 NOTE — Patient Instructions (Signed)
Your surgical pathology results revealed that the tumor Dr. Erlene Quan removed this week was a low grade, non-muscle invasive bladder cancer. This is excellent news. You do not require additional treatment for this cancer at this time. Instead, we will plan to repeat a cystoscopy with Dr. Erlene Quan in our office in 3 months to see if the tumor has regrown.  At the same time, the cystoscopy will allow Dr. Erlene Quan to see if your urethral stricture (narrowing of the tube you pass urine through) has come back.  If you have any questions or concerns, please call our clinic.

## 2019-09-26 NOTE — Progress Notes (Signed)
Catheter Removal  Patient is present today for a catheter removal.  78ml of water was drained from the balloon. A 16FR council tip foley cath was removed from the bladder no complications were noted . Patient tolerated well.  Performed by: Debroah Loop, PA-C   Additional notes: Surgical pathology results reviewed today. I explained that the tumor that was resected was a low grade, non-muscle invasive bladder cancer. No further treatment indicated at this time. Will plan for surveillance as below.  Follow-up: Return in about 3 months (around 12/27/2019) for Surveillance cystoscopy with Dr. Erlene Quan.

## 2019-10-01 ENCOUNTER — Telehealth: Payer: Self-pay | Admitting: Radiology

## 2019-10-01 NOTE — Telephone Encounter (Signed)
Patient reports passing small clots with hematuria since Friday 09/27/2019 after having surgery on 09/23/2019 for a urethral stricture. What could cause this? Is it normal? Please return call to [phone].

## 2019-10-01 NOTE — Telephone Encounter (Signed)
I just spoke to the patient via telephone.  He reports passing approximately 3 clots about the size of his fingernail.  I explained that hematuria 8 days after surgery is normal, especially considering that he is on aspirin and Plavix.  I explained that he could expect this to continue for another 1 to 2 weeks.  I reviewed warning signs of gross hematuria including thick, ketchup-like urinary output, passage of large clots, and the inability to urinate.  I counseled him to contact our clinic immediately if he develops these.  He expressed understanding.

## 2019-10-09 ENCOUNTER — Telehealth: Payer: Self-pay | Admitting: Family Medicine

## 2019-10-09 NOTE — Telephone Encounter (Signed)
° °  SF 10/09/2019    Name: Dylan Villanueva    MRN: [id number]    DOB: 22-Jun-1940    AGE: 79 y.o.    GENDER: male    PCP Juline Patch, MD.   Called pt regarding Community Resource Referral for grab bars. Patient stated that they have already received grab bars and have no additional needs at this time.   Closing referral pending any other needs of patient.  [place], Care Management Phone: [phone] Email: [email or web address]

## 2019-10-10 ENCOUNTER — Other Ambulatory Visit: Payer: Self-pay | Admitting: Family Medicine

## 2019-10-10 DIAGNOSIS — I739 Peripheral vascular disease, unspecified: Secondary | ICD-10-CM

## 2019-10-10 DIAGNOSIS — I10 Essential (primary) hypertension: Secondary | ICD-10-CM

## 2019-10-10 DIAGNOSIS — I2510 Atherosclerotic heart disease of native coronary artery without angina pectoris: Secondary | ICD-10-CM

## 2019-10-10 MED ORDER — LISINOPRIL 5 MG PO TABS: 5.0000 mg | ORAL_TABLET | Freq: Every day | ORAL | 0 refills | Status: DC

## 2019-10-10 NOTE — Telephone Encounter (Signed)
Medication Refill - Medication:  Lisinopril Has the patient contacted their pharmacy? Yes.   Pharmacy states request was denied (Agent: If no, request that the patient contact the pharmacy for the refill.) (Agent: If yes, when and what did the pharmacy advise?)  Preferred Pharmacy (with phone number or street name): [facility]  Agent: Please be advised that RX refills may take up to 3 business days. We ask that you follow-up with your pharmacy.

## 2019-11-14 ENCOUNTER — Other Ambulatory Visit: Payer: Self-pay | Admitting: Family Medicine

## 2019-11-14 DIAGNOSIS — I70219 Atherosclerosis of native arteries of extremities with intermittent claudication, unspecified extremity: Secondary | ICD-10-CM

## 2019-11-15 ENCOUNTER — Telehealth: Payer: Self-pay

## 2019-11-15 ENCOUNTER — Encounter: Payer: Self-pay | Admitting: Family Medicine

## 2019-11-15 ENCOUNTER — Other Ambulatory Visit: Payer: Self-pay

## 2019-11-15 ENCOUNTER — Ambulatory Visit (INDEPENDENT_AMBULATORY_CARE_PROVIDER_SITE_OTHER): Payer: Medicare Other | Admitting: Family Medicine

## 2019-11-15 VITALS — BP 122/62 | HR 60 | Ht 67.0 in | Wt 139.0 lb

## 2019-11-15 DIAGNOSIS — I10 Essential (primary) hypertension: Secondary | ICD-10-CM

## 2019-11-15 DIAGNOSIS — E782 Mixed hyperlipidemia: Secondary | ICD-10-CM | POA: Diagnosis not present

## 2019-11-15 DIAGNOSIS — Z23 Encounter for immunization: Secondary | ICD-10-CM | POA: Diagnosis not present

## 2019-11-15 DIAGNOSIS — I70219 Atherosclerosis of native arteries of extremities with intermittent claudication, unspecified extremity: Secondary | ICD-10-CM | POA: Diagnosis not present

## 2019-11-15 DIAGNOSIS — I2510 Atherosclerotic heart disease of native coronary artery without angina pectoris: Secondary | ICD-10-CM | POA: Diagnosis not present

## 2019-11-15 DIAGNOSIS — F17200 Nicotine dependence, unspecified, uncomplicated: Secondary | ICD-10-CM | POA: Diagnosis not present

## 2019-11-15 DIAGNOSIS — I739 Peripheral vascular disease, unspecified: Secondary | ICD-10-CM | POA: Diagnosis not present

## 2019-11-15 DIAGNOSIS — D214 Benign neoplasm of connective and other soft tissue of abdomen: Secondary | ICD-10-CM

## 2019-11-15 MED ORDER — ATORVASTATIN CALCIUM 40 MG PO TABS: 40.0000 mg | ORAL_TABLET | Freq: Every day | ORAL | 1 refills | Status: DC

## 2019-11-15 MED ORDER — LISINOPRIL 5 MG PO TABS: 5.0000 mg | ORAL_TABLET | Freq: Every day | ORAL | 0 refills | Status: DC

## 2019-11-15 MED ORDER — AMLODIPINE BESYLATE 10 MG PO TABS: 10.0000 mg | ORAL_TABLET | Freq: Every day | ORAL | 1 refills | Status: DC

## 2019-11-15 MED ORDER — CILOSTAZOL 100 MG PO TABS: ORAL_TABLET | ORAL | 0 refills | Status: DC

## 2019-11-15 MED ORDER — CLOPIDOGREL BISULFATE 75 MG PO TABS: 75.0000 mg | ORAL_TABLET | Freq: Every day | ORAL | 1 refills | Status: DC

## 2019-11-15 NOTE — Patient Instructions (Signed)
Coping with Quitting Smoking  Quitting smoking is a physical and mental challenge. You will face cravings, withdrawal symptoms, and temptation. Before quitting, work with your health care provider to make a plan that can help you cope. Preparation can help you quit and keep you from giving in. How can I cope with cravings? Cravings usually last for 5-10 minutes. If you get through it, the craving will pass. Consider taking the following actions to help you cope with cravings:  Keep your mouth busy: ? Chew sugar-free gum. ? Suck on hard candies or a straw. ? Brush your teeth.  Keep your hands and body busy: ? Immediately change to a different activity when you feel a craving. ? Squeeze or play with a ball. ? Do an activity or a hobby, like making bead jewelry, practicing needlepoint, or working with wood. ? Mix up your normal routine. ? Take a short exercise break. Go for a quick walk or run up and down stairs. ? Spend time in public places where smoking is not allowed.  Focus on doing something kind or helpful for someone else.  Call a friend or family member to talk during a craving.  Join a support group.  Call a quit line, such as 1-800-QUIT-NOW.  Talk with your health care provider about medicines that might help you cope with cravings and make quitting easier for you. How can I deal with withdrawal symptoms? Your body may experience negative effects as it tries to get used to not having nicotine in the system. These effects are called withdrawal symptoms. They may include:  Feeling hungrier than normal.  Trouble concentrating.  Irritability.  Trouble sleeping.  Feeling depressed.  Restlessness and agitation.  Craving a cigarette. To manage withdrawal symptoms:  Avoid places, people, and activities that trigger your cravings.  Remember why you want to quit.  Get plenty of sleep.  Avoid coffee and other caffeinated drinks. These may worsen some of your  symptoms. How can I handle social situations? Social situations can be difficult when you are quitting smoking, especially in the first few weeks. To manage this, you can:  Avoid parties, bars, and other social situations where people might be smoking.  Avoid alcohol.  Leave right away if you have the urge to smoke.  Explain to your family and friends that you are quitting smoking. Ask for understanding and support.  Plan activities with friends or family where smoking is not an option. What are some ways I can cope with stress? Wanting to smoke may cause stress, and stress can make you want to smoke. Find ways to manage your stress. Relaxation techniques can help. For example:  Breathe slowly and deeply, in through your nose and out through your mouth.  Listen to soothing, relaxing music.  Talk with a family member or friend about your stress.  Light a candle.  Soak in a bath or take a shower.  Think about a peaceful place. What are some ways I can prevent weight gain? Be aware that many people gain weight after they quit smoking. However, not everyone does. To keep from gaining weight, have a plan in place before you quit and stick to the plan after you quit. Your plan should include:  Having healthy snacks. When you have a craving, it may help to: ? Eat plain popcorn, crunchy carrots, celery, or other cut vegetables. ? Chew sugar-free gum.  Changing how you eat: ? Eat small portion sizes at meals. ? Eat 4-6 small meals   throughout the day instead of 1-2 large meals a day. ? Be mindful when you eat. Do not watch television or do other things that might distract you as you eat.  Exercising regularly: ? Make time to exercise each day. If you do not have time for a long workout, do short bouts of exercise for 5-10 minutes several times a day. ? Do some form of strengthening exercise, like weight lifting, and some form of aerobic exercise, like running or swimming.  Drinking  plenty of water or other low-calorie or no-calorie drinks. Drink 6-8 glasses of water daily, or as much as instructed by your health care provider. Summary  Quitting smoking is a physical and mental challenge. You will face cravings, withdrawal symptoms, and temptation to smoke again. Preparation can help you as you go through these challenges.  You can cope with cravings by keeping your mouth busy (such as by chewing gum), keeping your body and hands busy, and making calls to family, friends, or a helpline for people who want to quit smoking.  You can cope with withdrawal symptoms by avoiding places where people smoke, avoiding drinks with caffeine, and getting plenty of rest.  Ask your health care provider about the different ways to prevent weight gain, avoid stress, and handle social situations. This information is not intended to replace advice given to you by your health care provider. Make sure you discuss any questions you have with your health care provider. Document Revised: 01/13/2017 Document Reviewed: 01/29/2016 Elsevier Patient Education  2020 [employer]  

## 2019-11-15 NOTE — Progress Notes (Signed)
Date:  11/15/2019   Name:  Dylan Villanueva   DOB:  1940/08/13   MRN:  [id number]   Chief Complaint: Hypertension, Hyperlipidemia, PVD, and Flu Vaccine  Hypertension This is a chronic problem. The current episode started more than 1 year ago. The problem has been gradually improving since onset. The problem is controlled. Pertinent negatives include no anxiety, blurred vision, chest pain, headaches, malaise/fatigue, neck pain, orthopnea, palpitations, peripheral edema, PND, shortness of breath or sweats. There are no associated agents to hypertension. Risk factors for coronary artery disease include dyslipidemia, male gender and smoking/tobacco exposure. Past treatments include calcium channel blockers and ACE inhibitors. The current treatment provides moderate improvement. There are no compliance problems.  There is no history of angina, kidney disease, CAD/MI, CVA, heart failure, left ventricular hypertrophy, PVD or retinopathy. There is no history of chronic renal disease, a hypertension causing med or renovascular disease.  Hyperlipidemia This is a chronic problem. The current episode started more than 1 year ago. The problem is controlled. Recent lipid tests were reviewed and are normal. He has no history of chronic renal disease, diabetes, hypothyroidism, liver disease, obesity or nephrotic syndrome. Pertinent negatives include no chest pain, myalgias or shortness of breath. Current antihyperlipidemic treatment includes statins. The current treatment provides moderate improvement of lipids. There are no compliance problems.     Lab Results  Component Value Date   CREATININE 1.07 09/16/2019   BUN 14 09/16/2019   NA 139 09/16/2019   K 3.9 09/16/2019   CL 103 09/16/2019   CO2 27 09/16/2019   Lab Results  Component Value Date   CHOL 112 05/20/2019   HDL 61 05/20/2019   LDLCALC 38 05/20/2019   TRIG 60 05/20/2019   CHOLHDL 3.6 05/08/2017   Lab Results  Component Value Date   TSH 1.050  05/08/2017   No results found for: HGBA1C Lab Results  Component Value Date   WBC 7.5 09/16/2019   HGB 13.7 09/16/2019   HCT 37.6 (L) 09/16/2019   MCV 95.9 09/16/2019   PLT 263 09/16/2019   Lab Results  Component Value Date   ALT 16 05/20/2019   AST 20 05/20/2019   ALKPHOS 74 05/20/2019   BILITOT 0.3 05/20/2019     Review of Systems  Constitutional: Negative for chills, fever and malaise/fatigue.  HENT: Negative for drooling, ear discharge, ear pain and sore throat.   Eyes: Negative for blurred vision.  Respiratory: Negative for cough, shortness of breath and wheezing.   Cardiovascular: Negative for chest pain, palpitations, orthopnea, leg swelling and PND.  Gastrointestinal: Negative for abdominal pain, blood in stool, constipation, diarrhea and nausea.  Endocrine: Negative for polydipsia.  Genitourinary: Negative for dysuria, frequency, hematuria and urgency.  Musculoskeletal: Negative for back pain, myalgias and neck pain.  Skin: Negative for rash.  Allergic/Immunologic: Negative for environmental allergies.  Neurological: Negative for dizziness and headaches.  Hematological: Does not bruise/bleed easily.  Psychiatric/Behavioral: Negative for suicidal ideas. The patient is not nervous/anxious.     Patient Active Problem List   Diagnosis Date Noted  . Atherosclerosis of native arteries of extremity with intermittent claudication ([place]) 06/25/2018  . Current every day smoker 06/25/2018  . Benign gastrointestinal stromal tumor (GIST)   . History of colonic polyps   . Benign neoplasm of transverse colon   . Benign neoplasm of ascending colon   . Gastric mass   . Bilateral carotid artery stenosis 08/30/2017  . Benign essential HTN 05/25/2017  . Non-ST elevation myocardial infarction (  NSTEMI), subendocardial infarction, subsequent episode of care ([place]) 05/25/2017  . Chronic venous insufficiency 05/08/2017  . Coronary artery disease involving native coronary artery of  native heart 05/08/2017  . Hyperlipidemia, mixed 05/08/2017  . Hypertension 05/08/2017  . History of prostate cancer 05/08/2017  . Gait instability 05/08/2017  . Vitamin D deficiency 05/08/2017  . Presence of stent in coronary artery in patient with coronary artery disease 05/08/2017  . History of shingles 05/08/2017    Allergies  Allergen Reactions  . Penicillins Nausea Only    Has patient had a PCN reaction causing immediate rash, facial/tongue/throat swelling, SOB or lightheadedness with hypotension: no Has patient had a PCN reaction causing severe rash involving mucus membranes or skin necrosis: no Has patient had a PCN reaction that required hospitalization: no Has patient had a PCN reaction occurring within the last 10 years: no If all of the above answers are "NO", then may proceed with Cephalosporin use.   . Sulfa Antibiotics Diarrhea    Past Surgical History:  Procedure Laterality Date  . APPENDECTOMY  1980  . CARDIAC SURGERY    . COLONOSCOPY WITH PROPOFOL N/A 03/02/2018   Procedure: COLONOSCOPY WITH PROPOFOL;  Surgeon: [name], MD;  Location: [place];  Service: Endoscopy;  Laterality: N/A;  . CORONARY ANGIOPLASTY WITH STENT PLACEMENT    . CYSTOSCOPY W/ RETROGRADES Bilateral 09/23/2019   Procedure: CYSTOSCOPY WITH RETROGRADE PYELOGRAM;  Surgeon: Hollice Espy, MD;  Location: [facility];  Service: Urology;  Laterality: Bilateral;  . CYSTOSCOPY WITH URETHRAL DILATATION N/A 09/23/2019   Procedure: CYSTOSCOPY WITH URETHRAL DILATATION;  Surgeon: Hollice Espy, MD;  Location: [facility];  Service: Urology;  Laterality: N/A;  . ESOPHAGOGASTRODUODENOSCOPY N/A 11/07/2017   Procedure: ESOPHAGOGASTRODUODENOSCOPY (EGD);  Surgeon: Jules Husbands, MD;  Location: [facility];  Service: General;  Laterality: N/A;  . ESOPHAGOGASTRODUODENOSCOPY (EGD) WITH PROPOFOL  03/02/2018   Procedure: ESOPHAGOGASTRODUODENOSCOPY (EGD) WITH PROPOFOL;  Surgeon: [name], MD;  Location: [place];  Service: Endoscopy;;  . EUS N/A 07/13/2017   Procedure: FULL UPPER ENDOSCOPIC ULTRASOUND (EUS) RADIAL;  Surgeon: Jola Schmidt, MD;  Location: [facility];  Service: Endoscopy;  Laterality: N/A;  . HIGH DOSE RADIATION IMPLANT INSERTION  2006  . LAPAROSCOPIC REMOVAL ABDOMINAL MASS N/A 11/07/2017   Procedure: LAPAROSCOPIC GASTRIC MASS RESECTION;  Surgeon: Jules Husbands, MD;  Location: [facility];  Service: General;  Laterality: N/A;  . LOWER EXTREMITY ANGIOGRAPHY Left 07/04/2018   Procedure: LOWER EXTREMITY ANGIOGRAPHY;  Surgeon: Katha Cabal, MD;  Location: [place] [facility];  Service: Cardiovascular;  Laterality: Left;  . LOWER EXTREMITY ANGIOGRAPHY Left 08/10/2018   Procedure: LOWER EXTREMITY ANGIOGRAPHY;  Surgeon: Katha Cabal, MD;  Location: [place] [facility];  Service: Cardiovascular;  Laterality: Left;  . LOWER EXTREMITY ANGIOGRAPHY Left 09/12/2018   Procedure: LOWER EXTREMITY ANGIOGRAPHY;  Surgeon: Katha Cabal, MD;  Location: [place] [facility];  Service: Cardiovascular;  Laterality: Left;  . POLYPECTOMY  03/02/2018   Procedure: POLYPECTOMY;  Surgeon: [name], MD;  Location: [place];  Service: Endoscopy;;  . TRANSURETHRAL RESECTION OF BLADDER TUMOR N/A 09/23/2019   Procedure: TRANSURETHRAL RESECTION OF BLADDER TUMOR (TURBT);  Surgeon: Hollice Espy, MD;  Location: [facility];  Service: Urology;  Laterality: N/A;    Social History   Tobacco Use  . Smoking status: Current Every Day Smoker    Packs/day: 1.00    Years: 66.00    Pack years: 66.00    Types: Cigarettes    Start  date: 11/18/1952  . Smokeless tobacco: Never Used  . Tobacco comment:      Vaping Use  . Vaping Use: Never used  Substance Use Topics  . Alcohol use: Yes    Alcohol/week: 3.0 standard drinks    Types: 3 Cans of beer per week  . Drug use: Never     Medication list has been reviewed and updated.  Current Meds  Medication Sig  . amLODipine (NORVASC) 10  MG tablet Take 1 tablet (10 mg total) by mouth daily.  Marland Kitchen aspirin EC 81 MG tablet Take 81 mg by mouth daily.  Marland Kitchen atorvastatin (LIPITOR) 40 MG tablet Take 1 tablet (40 mg total) by mouth daily.  . cilostazol (PLETAL) 100 MG tablet TAKE 1 TABLET(100 MG) BY MOUTH TWICE DAILY  . clopidogrel (PLAVIX) 75 MG tablet Take 1 tablet (75 mg total) by mouth daily. TAKE 1 TABLET(75 MG) BY MOUTH DAILY  . lisinopril (ZESTRIL) 5 MG tablet Take 1 tablet (5 mg total) by mouth daily.  . Multiple Vitamins-Minerals (MULTIVITAMIN ADULT PO) Take 1 tablet by mouth daily.   . Vitamin D, Cholecalciferol, 1000 units CAPS Take 1 capsule by mouth daily. (Patient taking differently: Take 2,000 Units by mouth daily. )    PHQ 2/9 Scores 11/15/2019 08/12/2019 05/20/2019 08/08/2018  PHQ - 2 Score 0 0 0 0  PHQ- 9 Score 0 - 0 0    GAD 7 : Generalized Anxiety Score 11/15/2019 05/20/2019  Nervous, Anxious, on Edge 0 0  Control/stop worrying 0 0  Worry too much - different things 0 0  Trouble relaxing 0 0  Restless 0 0  Easily annoyed or irritable 0 0  Afraid - awful might happen 0 0  Total GAD 7 Score 0 0    BP Readings from Last 3 Encounters:  11/15/19 122/62  09/26/19 130/68  09/23/19 121/68    Physical Exam Vitals and nursing note reviewed.  HENT:     Head: Normocephalic.     Right Ear: Tympanic membrane, ear canal and external ear normal.     Left Ear: Tympanic membrane, ear canal and external ear normal.     Nose: Nose normal. No congestion or rhinorrhea.  Eyes:     General: No scleral icterus.       Right eye: No discharge.        Left eye: No discharge.     Conjunctiva/sclera: Conjunctivae normal.     Pupils: Pupils are equal, round, and reactive to light.  Neck:     Thyroid: No thyromegaly.     Vascular: No JVD.     Trachea: No tracheal deviation.  Cardiovascular:     Rate and Rhythm: Normal rate and regular rhythm.     Pulses:          Posterior tibial pulses are 1+ on the right side and 1+ on the left  side.     Heart sounds: Normal heart sounds, S1 normal and S2 normal. No murmur heard.  No systolic murmur is present.  No diastolic murmur is present.  No friction rub. No gallop.   Pulmonary:     Effort: No respiratory distress.     Breath sounds: Normal breath sounds. No wheezing, rhonchi or rales.  Abdominal:     General: Bowel sounds are normal.     Palpations: Abdomen is soft. There is no mass.     Tenderness: There is no abdominal tenderness. There is no guarding or rebound.  Musculoskeletal:  General: No tenderness. Normal range of motion.     Cervical back: Normal range of motion and neck supple.  Lymphadenopathy:     Cervical: No cervical adenopathy.  Skin:    General: Skin is warm.     Capillary Refill: Capillary refill takes less than 2 seconds.     Findings: No rash.  Neurological:     Mental Status: He is alert and oriented to person, place, and time.     Cranial Nerves: No cranial nerve deficit.     Deep Tendon Reflexes: Reflexes are normal and symmetric.     Wt Readings from Last 3 Encounters:  11/15/19 139 lb (63 kg)  09/23/19 140 lb (63.5 kg)  09/06/19 138 lb (62.6 kg)    BP 122/62   Pulse 60   Ht 5\' 7"  (1.702 m)   Wt 139 lb (63 kg)   BMI 21.77 kg/m   Assessment and Plan: 1. Essential hypertension .  Controlled.  Stable.  Blood pressure today is 122/62.  We will continue amlodipine 10 mg once a day and lisinopril 5 mg for blood pressure control. - amLODipine (NORVASC) 10 MG tablet; Take 1 tablet (10 mg total) by mouth daily.  Dispense: 90 tablet; Refill: 1 - lisinopril (ZESTRIL) 5 MG tablet; Take 1 tablet (5 mg total) by mouth daily.  Dispense: 90 tablet; Refill: 0  2. Hyperlipidemia, mixed Chronic.  Controlled.  Stable.  Continue atorvastatin 40 mg once a day and we will check lipid panel. - atorvastatin (LIPITOR) 40 MG tablet; Take 1 tablet (40 mg total) by mouth daily.  Dispense: 90 tablet; Refill: 1 - Lipid Panel With LDL/HDL Ratio  3.  Coronary artery disease involving native coronary artery of native heart without angina pectoris Followed by cardiology Dr. Nehemiah Massed.  We will continue above medications for blood pressure control.  Reviewed previous stress test evaluation.  In the meantime patient will continue low-dose aspirin 81 mg as well as Plavix. - amLODipine (NORVASC) 10 MG tablet; Take 1 tablet (10 mg total) by mouth daily.  Dispense: 90 tablet; Refill: 1 - clopidogrel (PLAVIX) 75 MG tablet; Take 1 tablet (75 mg total) by mouth daily. TAKE 1 TABLET(75 MG) BY MOUTH DAILY  Dispense: 90 tablet; Refill: 1 - lisinopril (ZESTRIL) 5 MG tablet; Take 1 tablet (5 mg total) by mouth daily.  Dispense: 90 tablet; Refill: 0  4. PAD (peripheral artery disease) (HCC) Chronic.  Controlled.  Stable.  As noted above. - amLODipine (NORVASC) 10 MG tablet; Take 1 tablet (10 mg total) by mouth daily.  Dispense: 90 tablet; Refill: 1 - lisinopril (ZESTRIL) 5 MG tablet; Take 1 tablet (5 mg total) by mouth daily.  Dispense: 90 tablet; Refill: 0  5. Atherosclerotic peripheral vascular disease with intermittent claudication ([place]) .  Controlled.  Stable.  Followed by Dr. Delana Meyer [facility].  Patient is currently on triple therapy with Pletal low-dose aspirin and Plavix. - cilostazol (PLETAL) 100 MG tablet; 1 tablet twice a day  Dispense: 180 tablet; Refill: 0 - clopidogrel (PLAVIX) 75 MG tablet; Take 1 tablet (75 mg total) by mouth daily. TAKE 1 TABLET(75 MG) BY MOUTH DAILY  Dispense: 90 tablet; Refill: 1  6. Benign gastrointestinal stromal tumor (GIST) Noted previous and February 2020 last seen by Dr. Perrin Maltese with suggested continuation of follow-up because of gastroparesis with Dr. Allen Norris.  Referral was made for follow-up and any further therapy. - Ambulatory referral to Gastroenterology  7. Current every day smoker Patient has been advised of  the health risks of smoking and counseled concerning cessation of tobacco products. I spent  over 3 minutes for discussion and to answer questions.  8. Need for immunization against influenza Discussed and administered - Flu Vaccine QUAD High Dose(Fluad)

## 2019-11-15 NOTE — Telephone Encounter (Unsigned)
Copied from [place] [id number]. Topic: General - Inquiry >> Nov 15, 2019  1:20 PM Oneta Rack wrote: Reason for CRM: patient would like a follow up call today regarding why he was being referred to a GI, advised patient of referral notes. Patient requesting to speak with PCP or nurse.

## 2019-11-15 NOTE — Telephone Encounter (Signed)
Called and explained GI to him again

## 2019-11-16 LAB — LIPID PANEL WITH LDL/HDL RATIO
Cholesterol, Total: 133 mg/dL (ref 100–199)
HDL: 62 mg/dL (ref >39)
LDL Chol Calc (NIH): 57 mg/dL (ref 0–99)
LDL/HDL Ratio: 0.9 ratio (ref 0.0–3.6)
Triglycerides: 71 mg/dL (ref 0–149)
VLDL Cholesterol Cal: 14 mg/dL (ref 5–40)

## 2019-11-26 NOTE — Progress Notes (Signed)
Primary Care Physician: Juline Patch, MD  Primary Gastroenterologist:  Dr. [name]  Chief Complaint  Patient presents with  . Follow-up    Colonoscopy     HPI: Dylan Villanueva is a 79 y.o. male here for follow-up with a history of having a colonoscopy by me in January 2020 that showed:  - Two 3 to 5 mm polyps in the transverse colon, removed with a cold snare. Resected and retrieved. - One 5 mm polyp in the ascending colon, removed with a cold snare. Resected and retrieved. - Non-bleeding internal hemorrhoids.  In 2019 the patient underwent a endoscopic ultrasound for a lesion in the cardia that was diagnosed as a GIST.  In January 2020 the patient's upper endoscopy showed staples where the resection had taken place.  The pathology of the lesion showed:  DIAGNOSIS:  A. GASTRIC MASS; RESECTION:  - GASTROINTESTINAL STROMAL TUMOR (GIST), SPINDLE CELL TYPE.  - ADJACENT NECROSIS WITH CALCIFICATION, SEE COMMENT.  - TWO LYMPH NODES NEGATIVE FOR MALIGNANCY (0/2).  - MARGINS ARE NEGATIVE (10MM TO CLOSEST PERIPHERAL MARGIN).   The patient had the lesion removed surgically with laparoscopic assist with a wedge resection of the stomach by Dr. Dahlia Byes.  Patient margin of his resection were clean.  The patient is not having any symptoms at the present time.  The patient is here just for follow-up and wants to know when he needs his next EGD.  Past Medical History:  Diagnosis Date  . Cancer [facility]) 2006   prostate   . Colon polyps   . Coronary artery disease   . Dyspnea    With exertion climbing up hill  . Hyperlipidemia   . Hypertension   . Myocardial infarction ([place])   . Prostate CA ([place])   . Smoker   . Squamous acanthoma of external ear, right     Current Outpatient Medications  Medication Sig Dispense Refill  . amLODipine (NORVASC) 10 MG tablet Take 1 tablet (10 mg total) by mouth daily. 90 tablet 1  . aspirin EC 81 MG tablet Take 81 mg by mouth daily.    Marland Kitchen atorvastatin  (LIPITOR) 40 MG tablet Take 1 tablet (40 mg total) by mouth daily. 90 tablet 1  . cilostazol (PLETAL) 100 MG tablet 1 tablet twice a day 180 tablet 0  . clopidogrel (PLAVIX) 75 MG tablet Take 1 tablet (75 mg total) by mouth daily. TAKE 1 TABLET(75 MG) BY MOUTH DAILY 90 tablet 1  . lisinopril (ZESTRIL) 5 MG tablet Take 1 tablet (5 mg total) by mouth daily. 90 tablet 0  . Multiple Vitamins-Minerals (MULTIVITAMIN ADULT PO) Take 1 tablet by mouth daily.     . Vitamin D, Cholecalciferol, 1000 units CAPS Take 1 capsule by mouth daily. (Patient taking differently: Take 2,000 Units by mouth daily. ) 60 capsule 3   No current facility-administered medications for this visit.    Allergies as of 11/27/2019 - Review Complete 11/27/2019  Allergen Reaction Noted  . Penicillins Nausea Only 05/03/2017  . Sulfa antibiotics Diarrhea 09/13/2019    ROS:  General: Negative for anorexia, weight loss, fever, chills, fatigue, weakness. ENT: Negative for hoarseness, difficulty swallowing , nasal congestion. CV: Negative for chest pain, angina, palpitations, dyspnea on exertion, peripheral edema.  Respiratory: Negative for dyspnea at rest, dyspnea on exertion, cough, sputum, wheezing.  GI: See history of present illness. GU:  Negative for dysuria, hematuria, urinary incontinence, urinary frequency, nocturnal urination.  Endo: Negative for unusual weight change.  Physical Examination:   BP (!) 119/52   Pulse 74   Ht 5\' 7"  (1.702 m)   Wt 138 lb 3.2 oz (62.7 kg)   BMI 21.65 kg/m   General: Well-nourished, well-developed in no acute distress.  Eyes: No icterus. Conjunctivae pink. Neuro: Alert and oriented x 3.  Grossly intact. Skin: Warm and dry, no jaundice.   Psych: Alert and cooperative, normal mood and affect.  Labs:    Imaging Studies: No results found.  Assessment and Plan:   Dylan Villanueva is a 79 y.o. y/o male who comes in today with a history of a GIST with clean margins removed  surgically.  The patient has not had any further problems and states he is feeling well.  The patient had his last EGD approximately year ago.  He has been told that a repeat EGD can be considered in 1 to 2 years from now and depending on his level of comfort and his age that may be the last EGD he needs.  The patient does not need anything at the present time.  The patient has been explained the plan agrees with it.     [name], MD. Marval Regal    Note: This dictation was prepared with Dragon dictation along with smaller phrase technology. Any transcriptional errors that result from this process are unintentional.

## 2019-11-27 ENCOUNTER — Encounter: Payer: Self-pay | Admitting: Gastroenterology

## 2019-11-27 ENCOUNTER — Other Ambulatory Visit: Payer: Self-pay

## 2019-11-27 ENCOUNTER — Ambulatory Visit (INDEPENDENT_AMBULATORY_CARE_PROVIDER_SITE_OTHER): Payer: Medicare Other | Admitting: Gastroenterology

## 2019-11-27 VITALS — BP 119/52 | HR 74 | Ht 67.0 in | Wt 138.2 lb

## 2019-11-27 DIAGNOSIS — I70219 Atherosclerosis of native arteries of extremities with intermittent claudication, unspecified extremity: Secondary | ICD-10-CM | POA: Diagnosis not present

## 2019-11-27 DIAGNOSIS — D214 Benign neoplasm of connective and other soft tissue of abdomen: Secondary | ICD-10-CM

## 2019-12-08 DIAGNOSIS — Z23 Encounter for immunization: Secondary | ICD-10-CM | POA: Diagnosis not present

## 2019-12-31 ENCOUNTER — Other Ambulatory Visit: Payer: Self-pay | Admitting: Urology

## 2019-12-31 ENCOUNTER — Other Ambulatory Visit: Payer: Self-pay

## 2019-12-31 ENCOUNTER — Ambulatory Visit (INDEPENDENT_AMBULATORY_CARE_PROVIDER_SITE_OTHER): Payer: Medicare Other | Admitting: Urology

## 2019-12-31 VITALS — BP 130/68 | HR 80

## 2019-12-31 DIAGNOSIS — C675 Malignant neoplasm of bladder neck: Secondary | ICD-10-CM | POA: Diagnosis not present

## 2019-12-31 DIAGNOSIS — Z8551 Personal history of malignant neoplasm of bladder: Secondary | ICD-10-CM | POA: Diagnosis not present

## 2019-12-31 DIAGNOSIS — N99111 Postprocedural bulbous urethral stricture: Secondary | ICD-10-CM

## 2019-12-31 LAB — MICROSCOPIC EXAMINATION: Bacteria, UA: NONE SEEN

## 2019-12-31 LAB — URINALYSIS, COMPLETE
Bilirubin, UA: NEGATIVE
Glucose, UA: NEGATIVE
Leukocytes,UA: NEGATIVE
Nitrite, UA: NEGATIVE
Specific Gravity, UA: 1.02 (ref 1.005–1.030)
Urobilinogen, Ur: 1 mg/dL (ref 0.2–1.0)
pH, UA: 6 (ref 5.0–7.5)

## 2019-12-31 MED ORDER — CEPHALEXIN 250 MG PO CAPS
500.0000 mg | ORAL_CAPSULE | Freq: Once | ORAL | Status: AC
  Administered 2019-12-31: 500 mg via ORAL

## 2019-12-31 NOTE — Progress Notes (Signed)
   12/31/19  CC:  Chief Complaint  Patient presents with  . Hematuria    HPI: 79 year old male with personal history of low risk bladder cancer returns today for surveillance.  Initially presented with gross hematuria.  Status post radiation for prostate cancer and had a bulbar urethral stricture, unable to accommodate cystoscopy in the office.  He was taken to the operating room on 09/23/2019 for urethral dilation as well as TURBT of a tumor noted at the time, proximally 1 cm at the left bladder neck.  Surgical pathology was consistent with low-grade noninvasive tumor.  He returns today for surveillance cystoscopy.  He has been voiding independently without a catheter.  Urinary symptoms are minimal.  Gross hematuria is resolved.   Blood pressure 130/68, pulse 80. NED. A&Ox3.   No respiratory distress   Abd soft, NT, ND Normal phallus with bilateral descended testicles  Cystoscopy Procedure Note  Patient identification was confirmed, informed consent was obtained, and patient was prepped using Betadine solution.  Lidocaine jelly was administered per urethral meatus.     Pre-Procedure: - Inspection reveals a normal caliber ureteral meatus.  Procedure: The flexible cystoscope was introduced without difficulty -Recurrent bulbar urethral stricture appreciated, initially thought that I could pass a 27 [place] but ultimately able raise a small flap of somewhat necrotic appearing tissue but the true lumen could still be easily seen.  I was able to sneak a sensor wire through the true lumen and advance the scope over the wire successfully actually relatively easily into the bladder. -Bladder with mild trabeculation and some sequela/stigmata of radiation, mildly trabeculated -previous TUR site with stellate scar left bladder neck.  Retroflexion unremarkable  Post-Procedure: - Patient tolerated the procedure well  Assessment/ Plan:  1. History of bladder cancer No evidence of  disease Per NCCN guidelines, will plan for surveillance cystoscopy in about 6 months, consider increasing interval thereafter. - Urinalysis, Complete - cephALEXin (KEFLEX) capsule 500 mg  2. Postprocedural bulbous urethral stricture Cystoscopy over a wire today, ultimately able to advance 92 [place] scope in over a wire Upon scope removal, there was marked flattening of the mucosal fold and is able to void High risk for significant or symptomatic recurrence Return sooner if he develops difficulty urinating Given need for mild intervention today, he was given Keflex 500 mg x 1  Cystoscopy in 6 months  Hollice Espy, MD

## 2020-02-10 ENCOUNTER — Other Ambulatory Visit: Payer: Self-pay | Admitting: Family Medicine

## 2020-02-10 DIAGNOSIS — I70219 Atherosclerosis of native arteries of extremities with intermittent claudication, unspecified extremity: Secondary | ICD-10-CM

## 2020-02-10 NOTE — Telephone Encounter (Signed)
Requested Prescriptions  Pending Prescriptions Disp Refills  . cilostazol (PLETAL) 100 MG tablet [Pharmacy Med Name: CILOSTAZOL 100MG  TABLETS] 180 tablet 0    Sig: TAKE 1 TABLET(100 MG) BY MOUTH TWICE DAILY     Hematology: Antiplatelets - cilostazol Failed - 02/10/2020 10:24 AM      Failed - HCT in normal range and within 180 days    HCT  Date Value Ref Range Status  09/16/2019 37.6 (L) 39.0 - 52.0 % Final   Hematocrit  Date Value Ref Range Status  05/08/2017 44.3 37.5 - 51.0 % Final         Passed - HGB in normal range and within 180 days    Hemoglobin  Date Value Ref Range Status  09/16/2019 13.7 13.0 - 17.0 g/dL Final  11/16/2019 63/14/9702 13.0 - 17.7 g/dL Final         Passed - PLT in normal range and within 180 days    Platelets  Date Value Ref Range Status  09/16/2019 263 150 - 400 K/uL Final  05/08/2017 220 150 - 379 x10E3/uL Final         Passed - WBC in normal range and within 180 days    WBC  Date Value Ref Range Status  09/16/2019 7.5 4.0 - 10.5 K/uL Final         Passed - Valid encounter within last 6 months    Recent Outpatient Visits          2 months ago Essential hypertension   [facility] 11/16/2019, MD   8 months ago Taking medication for chronic disease   [facility] Duanne Limerick, MD   1 year ago Essential hypertension   [facility] Duanne Limerick, MD   1 year ago Atherosclerotic peripheral vascular disease with intermittent claudication [facility])   [facility], MD   1 year ago Abdominal fullness   [facility] Duanne Limerick, MD      Future Appointments            In 3 months Duanne Limerick, MD [facility]

## 2020-02-12 DIAGNOSIS — E782 Mixed hyperlipidemia: Secondary | ICD-10-CM | POA: Diagnosis not present

## 2020-02-12 DIAGNOSIS — I70219 Atherosclerosis of native arteries of extremities with intermittent claudication, unspecified extremity: Secondary | ICD-10-CM | POA: Diagnosis not present

## 2020-02-12 DIAGNOSIS — I10 Essential (primary) hypertension: Secondary | ICD-10-CM | POA: Diagnosis not present

## 2020-02-12 DIAGNOSIS — F17200 Nicotine dependence, unspecified, uncomplicated: Secondary | ICD-10-CM | POA: Diagnosis not present

## 2020-02-12 DIAGNOSIS — I6523 Occlusion and stenosis of bilateral carotid arteries: Secondary | ICD-10-CM | POA: Diagnosis not present

## 2020-02-12 DIAGNOSIS — I2510 Atherosclerotic heart disease of native coronary artery without angina pectoris: Secondary | ICD-10-CM | POA: Diagnosis not present

## 2020-03-31 DIAGNOSIS — E785 Hyperlipidemia, unspecified: Secondary | ICD-10-CM | POA: Diagnosis not present

## 2020-03-31 DIAGNOSIS — I6523 Occlusion and stenosis of bilateral carotid arteries: Secondary | ICD-10-CM | POA: Diagnosis not present

## 2020-03-31 DIAGNOSIS — I10 Essential (primary) hypertension: Secondary | ICD-10-CM | POA: Diagnosis not present

## 2020-04-08 DIAGNOSIS — I498 Other specified cardiac arrhythmias: Secondary | ICD-10-CM | POA: Diagnosis not present

## 2020-04-08 DIAGNOSIS — F17200 Nicotine dependence, unspecified, uncomplicated: Secondary | ICD-10-CM | POA: Insufficient documentation

## 2020-04-08 DIAGNOSIS — I10 Essential (primary) hypertension: Secondary | ICD-10-CM | POA: Diagnosis not present

## 2020-04-08 DIAGNOSIS — I6523 Occlusion and stenosis of bilateral carotid arteries: Secondary | ICD-10-CM | POA: Diagnosis not present

## 2020-04-08 DIAGNOSIS — I2510 Atherosclerotic heart disease of native coronary artery without angina pectoris: Secondary | ICD-10-CM | POA: Diagnosis not present

## 2020-04-08 DIAGNOSIS — I214 Non-ST elevation (NSTEMI) myocardial infarction: Secondary | ICD-10-CM | POA: Diagnosis not present

## 2020-04-08 DIAGNOSIS — I70219 Atherosclerosis of native arteries of extremities with intermittent claudication, unspecified extremity: Secondary | ICD-10-CM | POA: Diagnosis not present

## 2020-04-08 DIAGNOSIS — E782 Mixed hyperlipidemia: Secondary | ICD-10-CM | POA: Diagnosis not present

## 2020-05-16 ENCOUNTER — Other Ambulatory Visit: Payer: Self-pay | Admitting: Family Medicine

## 2020-05-16 DIAGNOSIS — I10 Essential (primary) hypertension: Secondary | ICD-10-CM

## 2020-05-16 DIAGNOSIS — I70219 Atherosclerosis of native arteries of extremities with intermittent claudication, unspecified extremity: Secondary | ICD-10-CM

## 2020-05-16 DIAGNOSIS — I2510 Atherosclerotic heart disease of native coronary artery without angina pectoris: Secondary | ICD-10-CM

## 2020-05-16 DIAGNOSIS — I739 Peripheral vascular disease, unspecified: Secondary | ICD-10-CM

## 2020-05-16 NOTE — Telephone Encounter (Signed)
Requested Prescriptions  Pending Prescriptions Disp Refills  . amLODipine (NORVASC) 10 MG tablet [Pharmacy Med Name: AMLODIPINE BESYLATE 10MG  TABLETS] 90 tablet 0    Sig: TAKE 1 TABLET(10 MG) BY MOUTH DAILY     Cardiovascular:  Calcium Channel Blockers Failed - 05/16/2020  2:05 PM      Failed - Valid encounter within last 6 months    Recent Outpatient Visits          6 months ago Essential hypertension   [facility] Juline Patch, MD   12 months ago Taking medication for chronic disease   [facility] Juline Patch, MD   1 year ago Essential hypertension   [facility] Juline Patch, MD   1 year ago Atherosclerotic peripheral vascular disease with intermittent claudication [facility])   [facility] Juline Patch, MD   2 years ago Abdominal fullness   [facility] Juline Patch, MD      Future Appointments            In 2 days Juline Patch, MD [facility], [place] BP in normal range    BP Readings from Last 1 Encounters:  12/31/19 130/68         . cilostazol (PLETAL) 100 MG tablet [Pharmacy Med Name: CILOSTAZOL 100MG  TABLETS] 180 tablet 0    Sig: TAKE 1 TABLET(100 MG) BY MOUTH TWICE DAILY     Hematology: Antiplatelets - cilostazol Failed - 05/16/2020  2:05 PM      Failed - HCT in normal range and within 180 days    HCT  Date Value Ref Range Status  09/16/2019 37.6 (L) 39.0 - 52.0 % Final   Hematocrit  Date Value Ref Range Status  05/08/2017 44.3 37.5 - 51.0 % Final         Failed - HGB in normal range and within 180 days    Hemoglobin  Date Value Ref Range Status  09/16/2019 13.7 13.0 - 17.0 g/dL Final  05/08/2017 15.4 13.0 - 17.7 g/dL Final         Failed - PLT in normal range and within 180 days    Platelets  Date Value Ref Range Status  09/16/2019 263 150 - 400 K/uL Final  05/08/2017 220 150 - 379 x10E3/uL Final         Failed - WBC in normal range and within 180 days     WBC  Date Value Ref Range Status  09/16/2019 7.5 4.0 - 10.5 K/uL Final         Failed - Valid encounter within last 6 months    Recent Outpatient Visits          6 months ago Essential hypertension   [place], Deanna C, MD   12 months ago Taking medication for chronic disease   [place], Deanna C, MD   1 year ago Essential hypertension   [facility] Juline Patch, MD   1 year ago Atherosclerotic peripheral vascular disease with intermittent claudication [facility])   [facility] Juline Patch, MD   2 years ago Abdominal fullness   [place], Deanna C, MD      Future Appointments            In 2 days Juline Patch, MD [facility], [facility]

## 2020-05-18 ENCOUNTER — Other Ambulatory Visit: Payer: Self-pay

## 2020-05-18 ENCOUNTER — Ambulatory Visit (INDEPENDENT_AMBULATORY_CARE_PROVIDER_SITE_OTHER): Payer: Medicare Other | Admitting: Family Medicine

## 2020-05-18 ENCOUNTER — Encounter: Payer: Self-pay | Admitting: Family Medicine

## 2020-05-18 VITALS — BP 120/76 | HR 68 | Ht 67.0 in | Wt 138.0 lb

## 2020-05-18 DIAGNOSIS — I2510 Atherosclerotic heart disease of native coronary artery without angina pectoris: Secondary | ICD-10-CM | POA: Diagnosis not present

## 2020-05-18 DIAGNOSIS — I70219 Atherosclerosis of native arteries of extremities with intermittent claudication, unspecified extremity: Secondary | ICD-10-CM | POA: Diagnosis not present

## 2020-05-18 DIAGNOSIS — I10 Essential (primary) hypertension: Secondary | ICD-10-CM

## 2020-05-18 DIAGNOSIS — H6123 Impacted cerumen, bilateral: Secondary | ICD-10-CM | POA: Diagnosis not present

## 2020-05-18 DIAGNOSIS — I739 Peripheral vascular disease, unspecified: Secondary | ICD-10-CM

## 2020-05-18 DIAGNOSIS — R69 Illness, unspecified: Secondary | ICD-10-CM | POA: Diagnosis not present

## 2020-05-18 DIAGNOSIS — E782 Mixed hyperlipidemia: Secondary | ICD-10-CM

## 2020-05-18 DIAGNOSIS — D649 Anemia, unspecified: Secondary | ICD-10-CM

## 2020-05-18 MED ORDER — CILOSTAZOL 100 MG PO TABS
ORAL_TABLET | ORAL | 1 refills | Status: DC
Start: 2020-05-18 — End: 2020-08-14

## 2020-05-18 MED ORDER — ATORVASTATIN CALCIUM 40 MG PO TABS: 40.0000 mg | ORAL_TABLET | Freq: Every day | ORAL | 1 refills | Status: DC

## 2020-05-18 MED ORDER — AMLODIPINE BESYLATE 10 MG PO TABS: 10.0000 mg | ORAL_TABLET | Freq: Every day | ORAL | 1 refills | Status: DC

## 2020-05-18 MED ORDER — LISINOPRIL 5 MG PO TABS: 5.0000 mg | ORAL_TABLET | Freq: Every day | ORAL | 1 refills | Status: DC

## 2020-05-18 MED ORDER — CLOPIDOGREL BISULFATE 75 MG PO TABS: 75.0000 mg | ORAL_TABLET | Freq: Every day | ORAL | 1 refills | Status: DC

## 2020-05-18 NOTE — Progress Notes (Signed)
Date:  05/18/2020   Name:  Dylan Villanueva   DOB:  1941-01-10   MRN:  [id number]   Chief Complaint: Hypertension, Hyperlipidemia, Coronary Artery Disease, and peripheral artery disea  Hypertension This is a chronic problem. The current episode started more than 1 year ago. The problem has been gradually improving since onset. The problem is controlled. Pertinent negatives include no anxiety, blurred vision, chest pain, headaches, malaise/fatigue, neck pain, orthopnea, palpitations, peripheral edema, PND, shortness of breath or sweats. There are no associated agents to hypertension. There are no known risk factors for coronary artery disease. Past treatments include ACE inhibitors and calcium channel blockers. The current treatment provides moderate improvement. There are no compliance problems.  Hypertensive end-organ damage includes CAD/MI and PVD. There is no history of angina, kidney disease, CVA, heart failure, left ventricular hypertrophy or retinopathy. There is no history of chronic renal disease, a hypertension causing med or renovascular disease.  Hyperlipidemia This is a chronic problem. The current episode started more than 1 year ago. The problem is controlled. Recent lipid tests were reviewed and are normal. He has no history of chronic renal disease, diabetes or hypothyroidism. Pertinent negatives include no chest pain, focal sensory loss, focal weakness, leg pain, myalgias or shortness of breath. Current antihyperlipidemic treatment includes statins. The current treatment provides moderate improvement of lipids. There are no compliance problems.   Coronary Artery Disease Presents for follow-up visit. Pertinent negatives include no chest pain, chest pressure, chest tightness, dizziness, leg swelling, muscle weakness, palpitations, shortness of breath or weight gain. Risk factors include hyperlipidemia and hypertension. The symptoms have been stable.    Lab Results  Component Value Date    CREATININE 1.07 09/16/2019   BUN 14 09/16/2019   NA 139 09/16/2019   K 3.9 09/16/2019   CL 103 09/16/2019   CO2 27 09/16/2019   Lab Results  Component Value Date   CHOL 133 11/15/2019   HDL 62 11/15/2019   LDLCALC 57 11/15/2019   TRIG 71 11/15/2019   CHOLHDL 3.6 05/08/2017   Lab Results  Component Value Date   TSH 1.050 05/08/2017   No results found for: HGBA1C Lab Results  Component Value Date   WBC 7.5 09/16/2019   HGB 13.7 09/16/2019   HCT 37.6 (L) 09/16/2019   MCV 95.9 09/16/2019   PLT 263 09/16/2019   Lab Results  Component Value Date   ALT 16 05/20/2019   AST 20 05/20/2019   ALKPHOS 74 05/20/2019   BILITOT 0.3 05/20/2019     Review of Systems  Constitutional: Negative for chills, fever, malaise/fatigue and weight gain.  HENT: Negative for drooling, ear discharge, ear pain and sore throat.   Eyes: Negative for blurred vision.  Respiratory: Negative for cough, chest tightness, shortness of breath and wheezing.   Cardiovascular: Negative for chest pain, palpitations, orthopnea, leg swelling and PND.  Gastrointestinal: Negative for abdominal pain, blood in stool, constipation, diarrhea and nausea.  Endocrine: Negative for polydipsia.  Genitourinary: Negative for dysuria, frequency, hematuria and urgency.  Musculoskeletal: Negative for back pain, myalgias, muscle weakness and neck pain.  Skin: Negative for rash.  Allergic/Immunologic: Negative for environmental allergies.  Neurological: Negative for dizziness, focal weakness and headaches.  Hematological: Does not bruise/bleed easily.  Psychiatric/Behavioral: Negative for suicidal ideas. The patient is not nervous/anxious.     Patient Active Problem List   Diagnosis Date Noted  . Atherosclerosis of native arteries of extremity with intermittent claudication ([place]) 06/25/2018  . Current every day smoker  06/25/2018  . Benign gastrointestinal stromal tumor (GIST)   . History of colonic polyps   . Benign  neoplasm of transverse colon   . Benign neoplasm of ascending colon   . Gastric mass   . Bilateral carotid artery stenosis 08/30/2017  . Benign essential HTN 05/25/2017  . Non-ST elevation myocardial infarction (NSTEMI), subendocardial infarction, subsequent episode of care ([place]) 05/25/2017  . Chronic venous insufficiency 05/08/2017  . Coronary artery disease involving native coronary artery of native heart 05/08/2017  . Hyperlipidemia, mixed 05/08/2017  . Hypertension 05/08/2017  . History of prostate cancer 05/08/2017  . Gait instability 05/08/2017  . Vitamin D deficiency 05/08/2017  . Presence of stent in coronary artery in patient with coronary artery disease 05/08/2017  . History of shingles 05/08/2017    Allergies  Allergen Reactions  . Penicillins Nausea Only    Has patient had a PCN reaction causing immediate rash, facial/tongue/throat swelling, SOB or lightheadedness with hypotension: no Has patient had a PCN reaction causing severe rash involving mucus membranes or skin necrosis: no Has patient had a PCN reaction that required hospitalization: no Has patient had a PCN reaction occurring within the last 10 years: no If all of the above answers are "NO", then may proceed with Cephalosporin use.   . Sulfa Antibiotics Diarrhea    Past Surgical History:  Procedure Laterality Date  . APPENDECTOMY  1980  . CARDIAC SURGERY    . COLONOSCOPY WITH PROPOFOL N/A 03/02/2018   Procedure: COLONOSCOPY WITH PROPOFOL;  Surgeon: [name], MD;  Location: [place];  Service: Endoscopy;  Laterality: N/A;  . CORONARY ANGIOPLASTY WITH STENT PLACEMENT    . CYSTOSCOPY W/ RETROGRADES Bilateral 09/23/2019   Procedure: CYSTOSCOPY WITH RETROGRADE PYELOGRAM;  Surgeon: Hollice Espy, MD;  Location: [facility];  Service: Urology;  Laterality: Bilateral;  . CYSTOSCOPY WITH URETHRAL DILATATION N/A 09/23/2019   Procedure: CYSTOSCOPY WITH URETHRAL DILATATION;  Surgeon: Hollice Espy, MD;   Location: [facility];  Service: Urology;  Laterality: N/A;  . ESOPHAGOGASTRODUODENOSCOPY N/A 11/07/2017   Procedure: ESOPHAGOGASTRODUODENOSCOPY (EGD);  Surgeon: Jules Husbands, MD;  Location: [facility];  Service: General;  Laterality: N/A;  . ESOPHAGOGASTRODUODENOSCOPY (EGD) WITH PROPOFOL  03/02/2018   Procedure: ESOPHAGOGASTRODUODENOSCOPY (EGD) WITH PROPOFOL;  Surgeon: [name], MD;  Location: [place];  Service: Endoscopy;;  . EUS N/A 07/13/2017   Procedure: FULL UPPER ENDOSCOPIC ULTRASOUND (EUS) RADIAL;  Surgeon: Jola Schmidt, MD;  Location: [facility];  Service: Endoscopy;  Laterality: N/A;  . HIGH DOSE RADIATION IMPLANT INSERTION  2006  . LAPAROSCOPIC REMOVAL ABDOMINAL MASS N/A 11/07/2017   Procedure: LAPAROSCOPIC GASTRIC MASS RESECTION;  Surgeon: Jules Husbands, MD;  Location: [facility];  Service: General;  Laterality: N/A;  . LOWER EXTREMITY ANGIOGRAPHY Left 07/04/2018   Procedure: LOWER EXTREMITY ANGIOGRAPHY;  Surgeon: Katha Cabal, MD;  Location: [place] [facility];  Service: Cardiovascular;  Laterality: Left;  . LOWER EXTREMITY ANGIOGRAPHY Left 08/10/2018   Procedure: LOWER EXTREMITY ANGIOGRAPHY;  Surgeon: Katha Cabal, MD;  Location: [place] [facility];  Service: Cardiovascular;  Laterality: Left;  . LOWER EXTREMITY ANGIOGRAPHY Left 09/12/2018   Procedure: LOWER EXTREMITY ANGIOGRAPHY;  Surgeon: Katha Cabal, MD;  Location: [place] [facility];  Service: Cardiovascular;  Laterality: Left;  . POLYPECTOMY  03/02/2018   Procedure: POLYPECTOMY;  Surgeon: [name], MD;  Location: [place];  Service: Endoscopy;;  . TRANSURETHRAL RESECTION OF BLADDER TUMOR N/A 09/23/2019   Procedure: TRANSURETHRAL RESECTION OF BLADDER TUMOR (TURBT);  Surgeon: Hollice Espy, MD;  Location: [facility];  Service: Urology;  Laterality: N/A;    Social History   Tobacco Use  . Smoking status: Current Every Day Smoker    Packs/day: 1.00    Years: 66.00    Pack years:  66.00    Types: Cigarettes    Start date: 11/18/1952  . Smokeless tobacco: Never Used  . Tobacco comment:      Vaping Use  . Vaping Use: Never used  Substance Use Topics  . Alcohol use: Yes    Alcohol/week: 3.0 standard drinks    Types: 3 Cans of beer per week  . Drug use: Never     Medication list has been reviewed and updated.  Current Meds  Medication Sig  . amLODipine (NORVASC) 10 MG tablet TAKE 1 TABLET(10 MG) BY MOUTH DAILY  . aspirin EC 81 MG tablet Take 81 mg by mouth daily.  Marland Kitchen atorvastatin (LIPITOR) 40 MG tablet Take 1 tablet (40 mg total) by mouth daily.  . cilostazol (PLETAL) 100 MG tablet TAKE 1 TABLET(100 MG) BY MOUTH TWICE DAILY  . clopidogrel (PLAVIX) 75 MG tablet Take 1 tablet (75 mg total) by mouth daily. TAKE 1 TABLET(75 MG) BY MOUTH DAILY  . lisinopril (ZESTRIL) 5 MG tablet Take 1 tablet (5 mg total) by mouth daily.  . Multiple Vitamins-Minerals (MULTIVITAMIN ADULT PO) Take 1 tablet by mouth daily.   . Vitamin D, Cholecalciferol, 1000 units CAPS Take 1 capsule by mouth daily. (Patient taking differently: Take 2,000 Units by mouth daily.)    PHQ 2/9 Scores 05/18/2020 11/15/2019 08/12/2019 05/20/2019  PHQ - 2 Score 0 0 0 0  PHQ- 9 Score 0 0 - 0    GAD 7 : Generalized Anxiety Score 05/18/2020 11/15/2019 05/20/2019  Nervous, Anxious, on Edge 0 0 0  Control/stop worrying 0 0 0  Worry too much - different things 0 0 0  Trouble relaxing 0 0 0  Restless 0 0 0  Easily annoyed or irritable 0 0 0  Afraid - awful might happen 0 0 0  Total GAD 7 Score 0 0 0    BP Readings from Last 3 Encounters:  05/18/20 120/76  12/31/19 130/68  11/27/19 (!) 119/52    Physical Exam Vitals and nursing note reviewed.  HENT:     Head: Normocephalic.     Right Ear: Tympanic membrane, ear canal and external ear normal. There is no impacted cerumen.     Left Ear: Tympanic membrane, ear canal and external ear normal. There is no impacted cerumen.     Nose: Nose normal. No congestion or  rhinorrhea.     Mouth/Throat:     Mouth: Mucous membranes are moist.  Eyes:     General: No scleral icterus.       Right eye: No discharge.        Left eye: No discharge.     Conjunctiva/sclera: Conjunctivae normal.     Pupils: Pupils are equal, round, and reactive to light.  Neck:     Thyroid: No thyromegaly.     Vascular: No JVD.     Trachea: No tracheal deviation.  Cardiovascular:     Rate and Rhythm: Normal rate and regular rhythm.     Heart sounds: Normal heart sounds. No murmur heard. No friction rub. No gallop.   Pulmonary:     Effort: No respiratory distress.     Breath sounds: Normal breath sounds. No wheezing, rhonchi or rales.  Chest:  Chest wall: No tenderness.  Abdominal:     General: Bowel sounds are normal.     Palpations: Abdomen is soft. There is no mass.     Tenderness: There is no abdominal tenderness. There is no guarding or rebound.  Musculoskeletal:        General: No tenderness. Normal range of motion.     Cervical back: Normal range of motion and neck supple.  Lymphadenopathy:     Cervical: No cervical adenopathy.  Skin:    General: Skin is warm.     Findings: No bruising, erythema or rash.  Neurological:     Mental Status: He is alert and oriented to person, place, and time.     Cranial Nerves: No cranial nerve deficit.     Deep Tendon Reflexes: Reflexes are normal and symmetric.     Wt Readings from Last 3 Encounters:  05/18/20 138 lb (62.6 kg)  11/27/19 138 lb 3.2 oz (62.7 kg)  11/15/19 139 lb (63 kg)    BP 120/76   Pulse 68   Ht 5\' 7"  (1.702 m)   Wt 138 lb (62.6 kg)   BMI 21.61 kg/m   Assessment and Plan:  1. Essential hypertension Chronic.  Controlled.  Stable.  Blood pressure today is 120/76.  We will continue amlodipine 10 mg once a day and lisinopril 5 mg once a day.  Will check renal function panel for electrolytes and GFR. - amLODipine (NORVASC) 10 MG tablet; Take 1 tablet (10 mg total) by mouth daily.  Dispense: 90  tablet; Refill: 1 - lisinopril (ZESTRIL) 5 MG tablet; Take 1 tablet (5 mg total) by mouth daily.  Dispense: 90 tablet; Refill: 1 - Basic Metabolic Panel (BMET)  2. 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  Controlled.  Stable.  Patient has history of CAD and PVD and will continue Pletal 100 mg 1 twice a day and Plavix 75 mg once a day. - cilostazol (PLETAL) 100 MG tablet; Take 1 tablet twice a day  Dispense: 180 tablet; Refill: 1 - clopidogrel (PLAVIX) 75 MG tablet; Take 1 tablet (75 mg total) by mouth daily. TAKE 1 TABLET(75 MG) BY MOUTH DAILY  Dispense: 90 tablet; Refill: 1  3. Hyperlipidemia, mixed Chronic.  Controlled.  Stable.  Continue Lipitor 40 mg once a day.  Will check lipid panel for current status of lipid control. - atorvastatin (LIPITOR) 40 MG tablet; Take 1 tablet (40 mg total) by mouth daily.  Dispense: 90 tablet; Refill: 1 - Lipid Panel With LDL/HDL Ratio  4. Coronary artery disease involving native coronary artery of native heart without angina pectoris Chronic.  Controlled.  Stable.  As noted above patient has had a history of coronary artery disease.  Patient is currently stable without angina or cardiac incidents.  We will continue amlodipine and chlorthalidone 12 at this time. - amLODipine (NORVASC) 10 MG tablet; Take 1 tablet (10 mg total) by mouth daily.  Dispense: 90 tablet; Refill: 1 - clopidogrel (PLAVIX) 75 MG tablet; Take 1 tablet (75 mg total) by mouth daily. TAKE 1 TABLET(75 MG) BY MOUTH DAILY  Dispense: 90 tablet; Refill: 1 - lisinopril (ZESTRIL) 5 MG tablet; Take 1 tablet (5 mg total) by mouth daily.  Dispense: 90 tablet; Refill: 1  5. PAD (peripheral artery disease) ([place]) Patient with history of PAD requiring controlled blood pressure as well as Pletal and Plavix for antiplatelet therapy. - amLODipine (NORVASC) 10 MG tablet; Take 1 tablet (10 mg total) by mouth daily.  Dispense: 90 tablet; Refill: 1 - lisinopril  (ZESTRIL) 5 MG tablet; Take 1 tablet (5 mg total) by mouth daily.  Dispense: 90 tablet; Refill: 1  6. Taking medication for chronic disease Patient is on Plavix at this time and we will check CBC for hypoplatelet activity. - CBC w/Diff/Platelet  7. Bilateral impacted cerumen Patient with history of cerumen impaction with impaction of the left canal as well as partial impaction of the right.  Will refer to ear nose and throat for evaluation and treatment - Ambulatory referral to ENT  8. Anemia, unspecified type As noted patient has a history of anemia we will recheck a CBC for current status of hemoglobin. - CBC w/Diff/Platelet

## 2020-05-19 LAB — CBC WITH DIFFERENTIAL/PLATELET
Basophils Absolute: 0.1 10*3/uL (ref 0.0–0.2)
Basos: 1 %
EOS (ABSOLUTE): 0.2 10*3/uL (ref 0.0–0.4)
Eos: 3 %
Hematocrit: 37.1 % — ABNORMAL LOW (ref 37.5–51.0)
Hemoglobin: 12.9 g/dL — ABNORMAL LOW (ref 13.0–17.7)
Immature Grans (Abs): 0 10*3/uL (ref 0.0–0.1)
Immature Granulocytes: 0 %
Lymphocytes Absolute: 1.6 10*3/uL (ref 0.7–3.1)
Lymphs: 24 %
MCH: 33.7 pg — ABNORMAL HIGH (ref 26.6–33.0)
MCHC: 34.8 g/dL (ref 31.5–35.7)
MCV: 97 fL (ref 79–97)
Monocytes Absolute: 0.6 10*3/uL (ref 0.1–0.9)
Monocytes: 9 %
Neutrophils Absolute: 4.3 10*3/uL (ref 1.4–7.0)
Neutrophils: 63 %
Platelets: 249 10*3/uL (ref 150–450)
RBC: 3.83 x10E6/uL — ABNORMAL LOW (ref 4.14–5.80)
RDW: 11.6 % (ref 11.6–15.4)
WBC: 6.8 10*3/uL (ref 3.4–10.8)

## 2020-05-19 LAB — BASIC METABOLIC PANEL
BUN/Creatinine Ratio: 17 (ref 10–24)
BUN: 14 mg/dL (ref 8–27)
CO2: 25 mmol/L (ref 20–29)
Calcium: 9.1 mg/dL (ref 8.6–10.2)
Chloride: 105 mmol/L (ref 96–106)
Creatinine, Ser: 0.83 mg/dL (ref 0.76–1.27)
Glucose: 92 mg/dL (ref 65–99)
Potassium: 4.5 mmol/L (ref 3.5–5.2)
Sodium: 143 mmol/L (ref 134–144)
eGFR: 88 mL/min/{1.73_m2} (ref >59)

## 2020-05-19 LAB — LIPID PANEL WITH LDL/HDL RATIO
Cholesterol, Total: 116 mg/dL (ref 100–199)
HDL: 61 mg/dL (ref >39)
LDL Chol Calc (NIH): 44 mg/dL (ref 0–99)
LDL/HDL Ratio: 0.7 ratio (ref 0.0–3.6)
Triglycerides: 42 mg/dL (ref 0–149)
VLDL Cholesterol Cal: 11 mg/dL (ref 5–40)

## 2020-05-20 LAB — SPECIMEN STATUS REPORT

## 2020-05-20 LAB — B12 AND FOLATE PANEL
Folate: >20 ng/mL (ref >3.0)
Vitamin B-12: 635 pg/mL (ref 232–1245)

## 2020-05-20 LAB — FERRITIN: Ferritin: 60 ng/mL (ref 30–400)

## 2020-05-22 ENCOUNTER — Ambulatory Visit (INDEPENDENT_AMBULATORY_CARE_PROVIDER_SITE_OTHER): Payer: Medicare Other

## 2020-05-22 DIAGNOSIS — D649 Anemia, unspecified: Secondary | ICD-10-CM | POA: Diagnosis not present

## 2020-05-22 LAB — HEMOCCULT GUIAC POC 1CARD (OFFICE)
Card #2 Fecal Occult Blod, POC: NEGATIVE
Card #3 Fecal Occult Blood, POC: NEGATIVE
Fecal Occult Blood, POC: NEGATIVE

## 2020-05-22 NOTE — Progress Notes (Signed)
Entered Hem card results- 3 out of 3 negative

## 2020-06-14 ENCOUNTER — Other Ambulatory Visit: Payer: Self-pay | Admitting: Family Medicine

## 2020-06-14 DIAGNOSIS — I739 Peripheral vascular disease, unspecified: Secondary | ICD-10-CM

## 2020-06-14 DIAGNOSIS — I10 Essential (primary) hypertension: Secondary | ICD-10-CM

## 2020-06-14 DIAGNOSIS — I2510 Atherosclerotic heart disease of native coronary artery without angina pectoris: Secondary | ICD-10-CM

## 2020-06-14 DIAGNOSIS — I70219 Atherosclerosis of native arteries of extremities with intermittent claudication, unspecified extremity: Secondary | ICD-10-CM

## 2020-06-16 DIAGNOSIS — H6122 Impacted cerumen, left ear: Secondary | ICD-10-CM | POA: Diagnosis not present

## 2020-06-16 DIAGNOSIS — H903 Sensorineural hearing loss, bilateral: Secondary | ICD-10-CM | POA: Diagnosis not present

## 2020-06-19 ENCOUNTER — Other Ambulatory Visit: Payer: Self-pay | Admitting: Family Medicine

## 2020-06-19 DIAGNOSIS — E782 Mixed hyperlipidemia: Secondary | ICD-10-CM

## 2020-06-30 ENCOUNTER — Other Ambulatory Visit (INDEPENDENT_AMBULATORY_CARE_PROVIDER_SITE_OTHER): Payer: Self-pay | Admitting: Nurse Practitioner

## 2020-06-30 ENCOUNTER — Other Ambulatory Visit: Payer: Self-pay

## 2020-06-30 ENCOUNTER — Ambulatory Visit (INDEPENDENT_AMBULATORY_CARE_PROVIDER_SITE_OTHER): Payer: Medicare Other | Admitting: Urology

## 2020-06-30 VITALS — BP 124/69 | HR 68 | Ht 67.0 in | Wt 135.0 lb

## 2020-06-30 DIAGNOSIS — Z8551 Personal history of malignant neoplasm of bladder: Secondary | ICD-10-CM | POA: Diagnosis not present

## 2020-06-30 DIAGNOSIS — I739 Peripheral vascular disease, unspecified: Secondary | ICD-10-CM

## 2020-06-30 MED ORDER — CEPHALEXIN 250 MG PO CAPS
500.0000 mg | ORAL_CAPSULE | Freq: Once | ORAL | Status: AC
  Administered 2020-06-30: 500 mg via ORAL

## 2020-06-30 NOTE — Progress Notes (Signed)
   06/30/20  CC:  Chief Complaint  Patient presents with  . Cysto    HPI: 80 year old male with personal history of low risk bladder cancer returns today for surveillance.  Initially presented with gross hematuria.  Status post radiation for prostate cancer and had a bulbar urethral stricture, unable to accommodate cystoscopy in the office.  He was taken to the operating room on 09/23/2019 for urethral dilation as well as TURBT of a tumor noted at the time, proximally 1 cm at the left bladder neck.  Surgical pathology was consistent with low-grade noninvasive tumor.  He denies any urinary symptoms today.  No dysuria or gross hematuria.  Good stream.   Blood pressure 130/68, pulse 80. NED. A&Ox3.   No respiratory distress   Abd soft, NT, ND Normal phallus with bilateral descended testicles  Cystoscopy Procedure Note  Patient identification was confirmed, informed consent was obtained, and patient was prepped using Betadine solution.  Lidocaine jelly was administered per urethral meatus.     Pre-Procedure: - Inspection reveals a normal caliber ureteral meatus.  Procedure: The flexible cystoscope was introduced without difficulty -Recurrent bulbar urethral stricture appreciated, initially thought that I could pass a 79 [place] but ultimately able raise a small flap of somewhat necrotic appearing tissue but the true lumen could still be easily seen.  I was able to advance a super stiff wire wire through the true lumen and advance the scope over the wire successfully actually relatively easily into the bladde similar to the previous occation. -Bladder with mild trabeculation and some sequela/stigmata of radiation, mildly trabeculated -previous TUR site with stellate scar left bladder neck.  Retroflexion unremarkable  Post-Procedure: - Patient tolerated the procedure well  Assessment/ Plan:  1. History of bladder cancer No evidence of disease Per NCCN guidelines, will plan for  surveillance cystoscopy in about 6 months x 2 years then less frequently thereafter - Urinalysis, Complete - cephALEXin (KEFLEX) capsule 500 mg  2. Postprocedural bulbous urethral stricture Cystoscopy over a wire today, ultimately able to advance 29 [place] scope in over a wire Upon scope removal, there was marked flattening of the mucosal fold and is able to void High risk for significant or symptomatic recurrence Return sooner if he develops difficulty urinating Given need for mild intervention today, he was given Keflex 500 mg x 1  Cystoscopy in 6 months/ possible urethral dilation  Hollice Espy, MD

## 2020-07-01 NOTE — Progress Notes (Signed)
MRN : [id number]  Dylan Villanueva is a 80 y.o. (1940/06/30) male who presents with chief complaint of No chief complaint on file. Marland Kitchen  History of Present Illness:   The patient returns to the office for followup and review status post angiogram with interventionon 09/12/2018.  Procedure(s) Performed: 1. Introduction catheter intoleftlower extremity 3rd order catheter placement from right femoral approach 2.Introduction catheter into left lower extremity third order catheter placement from left ankle approach  3. Ultrasound-guided access to theright common femoral artery and left posterior tibial arteryartery 4.Crosser atherectomy withpercutaneous transluminal angioplastyand stent placement leftsuperficial femoral and popliteal arteries 4. Percutaneous transluminal angioplastyleft proximal posterior tibial and tibial peroneal trunk  5. Percutaneous transluminal angioplasty left external iliac artery secondary to in-stent restenosis 6.Star close closurerightcommon femoral arteriotomyand manual pressure held left posterior tibial at the ankle  The patient notes improvement in the lower extremity symptoms. No interval shortening of the patient's claudication distance or rest pain symptoms. No new ulcers or wounds have occurred since the last visit.  There have been no significant changes to the patient's overall health care.  The patient denies amaurosis fugax or recent TIA symptoms. There are no recent neurological changes noted. The patient denies history of DVT, PE or superficial thrombophlebitis. The patient denies recent episodes of angina or shortness of breath.   ABI's Rt=1.08and Lt=1.22(previous ABI's Rt=0.98and Lt=1.17)  Previous duplex ultrasound of the left SFA mild to moderate stenosis of the proximal SFA, the SFA stent is widely patent  No outpatient medications  have been marked as taking for the 07/02/20 encounter (Appointment) with Delana Meyer, Dolores Lory, MD.    Past Medical History:  Diagnosis Date  . Cancer [facility]) 2006   prostate   . Colon polyps   . Coronary artery disease   . Dyspnea    With exertion climbing up hill  . Hyperlipidemia   . Hypertension   . Myocardial infarction ([place])   . Prostate CA ([place])   . Smoker   . Squamous acanthoma of external ear, right     Past Surgical History:  Procedure Laterality Date  . APPENDECTOMY  1980  . CARDIAC SURGERY    . COLONOSCOPY WITH PROPOFOL N/A 03/02/2018   Procedure: COLONOSCOPY WITH PROPOFOL;  Surgeon: [name], MD;  Location: [place];  Service: Endoscopy;  Laterality: N/A;  . CORONARY ANGIOPLASTY WITH STENT PLACEMENT    . CYSTOSCOPY W/ RETROGRADES Bilateral 09/23/2019   Procedure: CYSTOSCOPY WITH RETROGRADE PYELOGRAM;  Surgeon: Hollice Espy, MD;  Location: [facility];  Service: Urology;  Laterality: Bilateral;  . CYSTOSCOPY WITH URETHRAL DILATATION N/A 09/23/2019   Procedure: CYSTOSCOPY WITH URETHRAL DILATATION;  Surgeon: Hollice Espy, MD;  Location: [facility];  Service: Urology;  Laterality: N/A;  . ESOPHAGOGASTRODUODENOSCOPY N/A 11/07/2017   Procedure: ESOPHAGOGASTRODUODENOSCOPY (EGD);  Surgeon: Jules Husbands, MD;  Location: [facility];  Service: General;  Laterality: N/A;  . ESOPHAGOGASTRODUODENOSCOPY (EGD) WITH PROPOFOL  03/02/2018   Procedure: ESOPHAGOGASTRODUODENOSCOPY (EGD) WITH PROPOFOL;  Surgeon: [name], MD;  Location: [place];  Service: Endoscopy;;  . EUS N/A 07/13/2017   Procedure: FULL UPPER ENDOSCOPIC ULTRASOUND (EUS) RADIAL;  Surgeon: Jola Schmidt, MD;  Location: [facility];  Service: Endoscopy;  Laterality: N/A;  . HIGH DOSE RADIATION IMPLANT INSERTION  2006  . LAPAROSCOPIC REMOVAL ABDOMINAL MASS N/A 11/07/2017   Procedure: LAPAROSCOPIC GASTRIC MASS RESECTION;  Surgeon: Jules Husbands, MD;  Location: [facility];  Service: General;  Laterality:  N/A;  . LOWER EXTREMITY ANGIOGRAPHY Left 07/04/2018  Procedure: LOWER EXTREMITY ANGIOGRAPHY;  Surgeon: Katha Cabal, MD;  Location: [place] [facility];  Service: Cardiovascular;  Laterality: Left;  . LOWER EXTREMITY ANGIOGRAPHY Left 08/10/2018   Procedure: LOWER EXTREMITY ANGIOGRAPHY;  Surgeon: Katha Cabal, MD;  Location: [place] [facility];  Service: Cardiovascular;  Laterality: Left;  . LOWER EXTREMITY ANGIOGRAPHY Left 09/12/2018   Procedure: LOWER EXTREMITY ANGIOGRAPHY;  Surgeon: Katha Cabal, MD;  Location: [place] [facility];  Service: Cardiovascular;  Laterality: Left;  . POLYPECTOMY  03/02/2018   Procedure: POLYPECTOMY;  Surgeon: [name], MD;  Location: [place];  Service: Endoscopy;;  . TRANSURETHRAL RESECTION OF BLADDER TUMOR N/A 09/23/2019   Procedure: TRANSURETHRAL RESECTION OF BLADDER TUMOR (TURBT);  Surgeon: Hollice Espy, MD;  Location: [facility];  Service: Urology;  Laterality: N/A;    Social History Social History   Tobacco Use  . Smoking status: Current Every Day Smoker    Packs/day: 1.00    Years: 66.00    Pack years: 66.00    Types: Cigarettes    Start date: 11/18/1952  . Smokeless tobacco: Never Used  . Tobacco comment:      Vaping Use  . Vaping Use: Never used  Substance Use Topics  . Alcohol use: Yes    Alcohol/week: 3.0 standard drinks    Types: 3 Cans of beer per week  . Drug use: Never    Family History Family History  Problem Relation Age of Onset  . Cancer Mother        pancreatic  . Dementia Father   . Cancer Sister        breast   . Aneurysm Brother   . Bladder Cancer Neg Hx   . Prostate cancer Neg Hx   . Kidney cancer Neg Hx     Allergies  Allergen Reactions  . Penicillins Nausea Only    Has patient had a PCN reaction causing immediate rash, facial/tongue/throat swelling, SOB or lightheadedness with hypotension: no Has patient had a PCN reaction causing severe rash involving mucus membranes or skin  necrosis: no Has patient had a PCN reaction that required hospitalization: no Has patient had a PCN reaction occurring within the last 10 years: no If all of the above answers are "NO", then may proceed with Cephalosporin use.   . Sulfa Antibiotics Diarrhea     REVIEW OF SYSTEMS (Negative unless checked)  Constitutional: [] Weight loss  [] Fever  [] Chills Cardiac: [] Chest pain   [] Chest pressure   [] Palpitations   [] Shortness of breath when laying flat   [] Shortness of breath with exertion. Vascular:  [x] Pain in legs with walking   [] Pain in legs at rest  [] History of DVT   [] Phlebitis   [] Swelling in legs   [] Varicose veins   [] Non-healing ulcers Pulmonary:   [] Uses home oxygen   [] Productive cough   [] Hemoptysis   [] Wheeze  [] COPD   [] Asthma Neurologic:  [] Dizziness   [] Seizures   [] History of stroke   [] History of TIA  [] Aphasia   [] Vissual changes   [] Weakness or numbness in arm   [] Weakness or numbness in leg Musculoskeletal:   [] Joint swelling   [] Joint pain   [] Low back pain Hematologic:  [] Easy bruising  [] Easy bleeding   [] Hypercoagulable state   [] Anemic Gastrointestinal:  [] Diarrhea   [] Vomiting  [] Gastroesophageal reflux/heartburn   [] Difficulty swallowing. Genitourinary:  [] Chronic kidney disease   [] Difficult urination  [] Frequent urination   [] Blood in urine Skin:  [] Rashes   [] Ulcers  Psychological:  []   History of anxiety   []  History of major depression.  Physical Examination  There were no vitals filed for this visit. There is no height or weight on file to calculate BMI. Gen: WD/WN, NAD Head: [place]/AT, No temporalis wasting.  Ear/Nose/Throat: Hearing grossly intact, nares w/o erythema or drainage Eyes: PER, EOMI, sclera nonicteric.  Neck: Supple, no large masses.   Pulmonary:  Good air movement, no audible wheezing bilaterally, no use of accessory muscles.  Cardiac: RRR, no JVD Vascular: Vessel Right Left  Radial Palpable Palpable  PT Palpable Palpable  DP Palpable  Palpable  Gastrointestinal: Non-distended. No guarding/no peritoneal signs.  Musculoskeletal: M/S 5/5 throughout.  No deformity or atrophy.  Neurologic: CN 2-12 intact. Symmetrical.  Speech is fluent. Motor exam as listed above. Psychiatric: Judgment intact, Mood & affect appropriate for pt's clinical situation. Dermatologic: No rashes or ulcers noted.  No changes consistent with cellulitis. Lymph : No lichenification or skin changes of chronic lymphedema.  CBC Lab Results  Component Value Date   WBC 6.8 05/18/2020   HGB 12.9 (L) 05/18/2020   HCT 37.1 (L) 05/18/2020   MCV 97 05/18/2020   PLT 249 05/18/2020    BMET    Component Value Date/Time   NA 143 05/18/2020 1107   K 4.5 05/18/2020 1107   CL 105 05/18/2020 1107   CO2 25 05/18/2020 1107   GLUCOSE 92 05/18/2020 1107   GLUCOSE 114 (H) 09/16/2019 1032   BUN 14 05/18/2020 1107   CREATININE 0.83 05/18/2020 1107   CALCIUM 9.1 05/18/2020 1107   GFRNONAA >60 09/16/2019 1032   GFRAA >60 09/16/2019 1032   CrCl cannot be calculated (Patient's most recent lab result is older than the maximum 21 days allowed.).  COAG Lab Results  Component Value Date   INR 0.98 11/02/2017    Radiology No results found.   Assessment/Plan 1. Atherosclerosis of native artery of both lower extremities with intermittent claudication (HCC)  Recommend:  The patient has evidence of atherosclerosis of the lower extremities with claudication.  The patient does not voice lifestyle limiting changes at this point in time.  Noninvasive studies do not suggest clinically significant change.  No invasive studies, angiography or surgery at this time The patient should continue walking and begin a more formal exercise program.  The patient should continue antiplatelet therapy and aggressive treatment of the lipid abnormalities  No changes in the patient's medications at this time  The patient should continue wearing graduated compression socks 10-15  mmHg strength to control the mild edema.   - VAS [place] ABI WITH/WO TBI; Future  2. Bilateral carotid artery stenosis Recommend:  Given the patient's asymptomatic subcritical stenosis no further invasive testing or surgery at this time.  Continue antiplatelet therapy as prescribed Continue management of CAD, HTN and Hyperlipidemia Healthy heart diet,  encouraged exercise at least 4 times per week Follow up in 12 months with duplex ultrasound and physical exam   3. Chronic venous insufficiency  No surgery or intervention at this point in time.    I have reviewed my previous discussion with the patient regarding swelling and why it  causes symptoms.  The patient is doing well with compression and will continue wearing graduated compression stockings class 1 (20-30 mmHg) on a daily basis a prescription was given. The patient will  continue wearing the stockings first thing in the morning and removing them in the evening. The patient is instructed specifically not to sleep in the stockings.    In addition,  behavioral modification including elevation during the day and exercise will be continued.    Patient should follow-up on an annual basis   4. Coronary artery disease involving native coronary artery of native heart without angina pectoris Continue cardiac and antihypertensive medications as already ordered and reviewed, no changes at this time.  Continue statin as ordered and reviewed, no changes at this time  Nitrates PRN for chest pain   5. Primary hypertension Continue antihypertensive medications as already ordered, these medications have been reviewed and there are no changes at this time.   6. Hyperlipidemia, mixed Continue statin as ordered and reviewed, no changes at this time    Hortencia Pilar, MD  07/01/2020 2:24 PM

## 2020-07-02 ENCOUNTER — Ambulatory Visit (INDEPENDENT_AMBULATORY_CARE_PROVIDER_SITE_OTHER): Payer: Medicare Other

## 2020-07-02 ENCOUNTER — Ambulatory Visit (INDEPENDENT_AMBULATORY_CARE_PROVIDER_SITE_OTHER): Payer: Medicare Other | Admitting: Vascular Surgery

## 2020-07-02 ENCOUNTER — Encounter (INDEPENDENT_AMBULATORY_CARE_PROVIDER_SITE_OTHER): Payer: Self-pay | Admitting: Vascular Surgery

## 2020-07-02 ENCOUNTER — Other Ambulatory Visit: Payer: Self-pay

## 2020-07-02 VITALS — BP 124/70 | HR 64 | Resp 16 | Ht 67.0 in | Wt 134.0 lb

## 2020-07-02 DIAGNOSIS — I10 Essential (primary) hypertension: Secondary | ICD-10-CM | POA: Diagnosis not present

## 2020-07-02 DIAGNOSIS — I2510 Atherosclerotic heart disease of native coronary artery without angina pectoris: Secondary | ICD-10-CM | POA: Diagnosis not present

## 2020-07-02 DIAGNOSIS — I739 Peripheral vascular disease, unspecified: Secondary | ICD-10-CM | POA: Diagnosis not present

## 2020-07-02 DIAGNOSIS — I872 Venous insufficiency (chronic) (peripheral): Secondary | ICD-10-CM | POA: Diagnosis not present

## 2020-07-02 DIAGNOSIS — I70213 Atherosclerosis of native arteries of extremities with intermittent claudication, bilateral legs: Secondary | ICD-10-CM

## 2020-07-02 DIAGNOSIS — E782 Mixed hyperlipidemia: Secondary | ICD-10-CM | POA: Diagnosis not present

## 2020-07-02 DIAGNOSIS — I6523 Occlusion and stenosis of bilateral carotid arteries: Secondary | ICD-10-CM

## 2020-07-02 LAB — MICROSCOPIC EXAMINATION
Bacteria, UA: NONE SEEN
Epithelial Cells (non renal): NONE SEEN /hpf (ref 0–10)
WBC, UA: NONE SEEN /hpf (ref 0–5)

## 2020-07-02 LAB — URINALYSIS, COMPLETE
Bilirubin, UA: NEGATIVE
Glucose, UA: NEGATIVE
Ketones, UA: NEGATIVE
Leukocytes,UA: NEGATIVE
Nitrite, UA: NEGATIVE
Protein,UA: NEGATIVE
RBC, UA: NEGATIVE
Specific Gravity, UA: 1.02 (ref 1.005–1.030)
Urobilinogen, Ur: 1 mg/dL (ref 0.2–1.0)
pH, UA: 7 (ref 5.0–7.5)

## 2020-07-15 ENCOUNTER — Ambulatory Visit: Payer: Medicare Other

## 2020-07-15 DIAGNOSIS — D649 Anemia, unspecified: Secondary | ICD-10-CM

## 2020-07-15 NOTE — Progress Notes (Signed)
Cbc ordered

## 2020-07-16 ENCOUNTER — Ambulatory Visit (INDEPENDENT_AMBULATORY_CARE_PROVIDER_SITE_OTHER): Payer: Medicare Other | Admitting: Family Medicine

## 2020-07-16 ENCOUNTER — Encounter: Payer: Self-pay | Admitting: Family Medicine

## 2020-07-16 ENCOUNTER — Other Ambulatory Visit: Payer: Self-pay

## 2020-07-16 VITALS — BP 130/60 | HR 88 | Ht 67.0 in | Wt 133.0 lb

## 2020-07-16 DIAGNOSIS — Z8509 Personal history of malignant neoplasm of other digestive organs: Secondary | ICD-10-CM

## 2020-07-16 DIAGNOSIS — D539 Nutritional anemia, unspecified: Secondary | ICD-10-CM

## 2020-07-16 DIAGNOSIS — I6523 Occlusion and stenosis of bilateral carotid arteries: Secondary | ICD-10-CM | POA: Diagnosis not present

## 2020-07-16 DIAGNOSIS — Z903 Acquired absence of stomach [part of]: Secondary | ICD-10-CM

## 2020-07-16 DIAGNOSIS — R634 Abnormal weight loss: Secondary | ICD-10-CM | POA: Diagnosis not present

## 2020-07-16 DIAGNOSIS — R142 Eructation: Secondary | ICD-10-CM

## 2020-07-16 LAB — CBC WITH DIFFERENTIAL/PLATELET
Basophils Absolute: 0.1 10*3/uL (ref 0.0–0.2)
Basos: 1 %
EOS (ABSOLUTE): 0.3 10*3/uL (ref 0.0–0.4)
Eos: 3 %
Hematocrit: 37.7 % (ref 37.5–51.0)
Hemoglobin: 12.9 g/dL — ABNORMAL LOW (ref 13.0–17.7)
Immature Grans (Abs): 0 10*3/uL (ref 0.0–0.1)
Immature Granulocytes: 0 %
Lymphocytes Absolute: 1.6 10*3/uL (ref 0.7–3.1)
Lymphs: 18 %
MCH: 34 pg — ABNORMAL HIGH (ref 26.6–33.0)
MCHC: 34.2 g/dL (ref 31.5–35.7)
MCV: 100 fL — ABNORMAL HIGH (ref 79–97)
Monocytes Absolute: 0.9 10*3/uL (ref 0.1–0.9)
Monocytes: 11 %
Neutrophils Absolute: 5.8 10*3/uL (ref 1.4–7.0)
Neutrophils: 67 %
Platelets: 219 10*3/uL (ref 150–450)
RBC: 3.79 x10E6/uL — ABNORMAL LOW (ref 4.14–5.80)
RDW: 12.2 % (ref 11.6–15.4)
WBC: 8.6 10*3/uL (ref 3.4–10.8)

## 2020-07-16 NOTE — Progress Notes (Signed)
Date:  07/16/2020   Name:  Dylan Villanueva   DOB:  01-29-1941   MRN:  [id number]   Chief Complaint: Anemia (Discuss lab results)  Anemia Presents for follow-up visit. Symptoms include bruises/bleeds easily and weight loss. There has been no abdominal pain, anorexia, confusion, fever, leg swelling, light-headedness, malaise/fatigue, pallor, palpitations or paresthesias. Signs of blood loss that are not present include hematemesis, hematochezia and melena. There are no compliance problems.     Lab Results  Component Value Date   CREATININE 0.83 05/18/2020   BUN 14 05/18/2020   NA 143 05/18/2020   K 4.5 05/18/2020   CL 105 05/18/2020   CO2 25 05/18/2020   Lab Results  Component Value Date   CHOL 116 05/18/2020   HDL 61 05/18/2020   LDLCALC 44 05/18/2020   TRIG 42 05/18/2020   CHOLHDL 3.6 05/08/2017   Lab Results  Component Value Date   TSH 1.050 05/08/2017   No results found for: HGBA1C Lab Results  Component Value Date   WBC 8.6 07/15/2020   HGB 12.9 (L) 07/15/2020   HCT 37.7 07/15/2020   MCV 100 (H) 07/15/2020   PLT 219 07/15/2020   Lab Results  Component Value Date   ALT 16 05/20/2019   AST 20 05/20/2019   ALKPHOS 74 05/20/2019   BILITOT 0.3 05/20/2019     Review of Systems  Constitutional: Positive for weight loss. Negative for fever and malaise/fatigue.  Cardiovascular: Negative for palpitations.  Gastrointestinal: Negative for abdominal pain, anorexia, hematemesis, hematochezia and melena.  Skin: Negative for pallor.  Neurological: Negative for light-headedness and paresthesias.  Hematological: Bruises/bleeds easily.  Psychiatric/Behavioral: Negative for confusion.    Patient Active Problem List   Diagnosis Date Noted  . Current smoker 04/08/2020  . Atherosclerosis of native arteries of extremity with intermittent claudication ([place]) 06/25/2018  . Current every day smoker 06/25/2018  . Benign gastrointestinal stromal tumor (GIST)   . History of  colonic polyps   . Benign neoplasm of transverse colon   . Benign neoplasm of ascending colon   . Gastric mass   . Bilateral carotid artery stenosis 08/30/2017  . Benign essential HTN 05/25/2017  . Non-ST elevation myocardial infarction (NSTEMI), subendocardial infarction, subsequent episode of care ([place]) 05/25/2017  . Chronic venous insufficiency 05/08/2017  . Coronary artery disease involving native coronary artery of native heart 05/08/2017  . Hyperlipidemia, mixed 05/08/2017  . Hypertension 05/08/2017  . History of prostate cancer 05/08/2017  . Gait instability 05/08/2017  . Vitamin D deficiency 05/08/2017  . Presence of stent in coronary artery in patient with coronary artery disease 05/08/2017  . History of shingles 05/08/2017    Allergies  Allergen Reactions  . Penicillins Nausea Only    Has patient had a PCN reaction causing immediate rash, facial/tongue/throat swelling, SOB or lightheadedness with hypotension: no Has patient had a PCN reaction causing severe rash involving mucus membranes or skin necrosis: no Has patient had a PCN reaction that required hospitalization: no Has patient had a PCN reaction occurring within the last 10 years: no If all of the above answers are "NO", then may proceed with Cephalosporin use.   . Sulfa Antibiotics Diarrhea    Past Surgical History:  Procedure Laterality Date  . APPENDECTOMY  1980  . CARDIAC SURGERY    . COLONOSCOPY WITH PROPOFOL N/A 03/02/2018   Procedure: COLONOSCOPY WITH PROPOFOL;  Surgeon: [name], MD;  Location: [place];  Service: Endoscopy;  Laterality: N/A;  . CORONARY ANGIOPLASTY  WITH STENT PLACEMENT    . CYSTOSCOPY W/ RETROGRADES Bilateral 09/23/2019   Procedure: CYSTOSCOPY WITH RETROGRADE PYELOGRAM;  Surgeon: Hollice Espy, MD;  Location: [facility];  Service: Urology;  Laterality: Bilateral;  . CYSTOSCOPY WITH URETHRAL DILATATION N/A 09/23/2019   Procedure: CYSTOSCOPY WITH URETHRAL DILATATION;  Surgeon:  Hollice Espy, MD;  Location: [facility];  Service: Urology;  Laterality: N/A;  . ESOPHAGOGASTRODUODENOSCOPY N/A 11/07/2017   Procedure: ESOPHAGOGASTRODUODENOSCOPY (EGD);  Surgeon: Jules Husbands, MD;  Location: [facility];  Service: General;  Laterality: N/A;  . ESOPHAGOGASTRODUODENOSCOPY (EGD) WITH PROPOFOL  03/02/2018   Procedure: ESOPHAGOGASTRODUODENOSCOPY (EGD) WITH PROPOFOL;  Surgeon: [name], MD;  Location: [place];  Service: Endoscopy;;  . EUS N/A 07/13/2017   Procedure: FULL UPPER ENDOSCOPIC ULTRASOUND (EUS) RADIAL;  Surgeon: Jola Schmidt, MD;  Location: [facility];  Service: Endoscopy;  Laterality: N/A;  . HIGH DOSE RADIATION IMPLANT INSERTION  2006  . LAPAROSCOPIC REMOVAL ABDOMINAL MASS N/A 11/07/2017   Procedure: LAPAROSCOPIC GASTRIC MASS RESECTION;  Surgeon: Jules Husbands, MD;  Location: [facility];  Service: General;  Laterality: N/A;  . LOWER EXTREMITY ANGIOGRAPHY Left 07/04/2018   Procedure: LOWER EXTREMITY ANGIOGRAPHY;  Surgeon: Katha Cabal, MD;  Location: [place] [facility];  Service: Cardiovascular;  Laterality: Left;  . LOWER EXTREMITY ANGIOGRAPHY Left 08/10/2018   Procedure: LOWER EXTREMITY ANGIOGRAPHY;  Surgeon: Katha Cabal, MD;  Location: [place] [facility];  Service: Cardiovascular;  Laterality: Left;  . LOWER EXTREMITY ANGIOGRAPHY Left 09/12/2018   Procedure: LOWER EXTREMITY ANGIOGRAPHY;  Surgeon: Katha Cabal, MD;  Location: [place] [facility];  Service: Cardiovascular;  Laterality: Left;  . POLYPECTOMY  03/02/2018   Procedure: POLYPECTOMY;  Surgeon: [name], MD;  Location: [place];  Service: Endoscopy;;  . TRANSURETHRAL RESECTION OF BLADDER TUMOR N/A 09/23/2019   Procedure: TRANSURETHRAL RESECTION OF BLADDER TUMOR (TURBT);  Surgeon: Hollice Espy, MD;  Location: [facility];  Service: Urology;  Laterality: N/A;    Social History   Tobacco Use  . Smoking status: Current Every Day Smoker    Packs/day: 1.00    Years:  66.00    Pack years: 66.00    Types: Cigarettes    Start date: 11/18/1952  . Smokeless tobacco: Never Used  . Tobacco comment:      Vaping Use  . Vaping Use: Never used  Substance Use Topics  . Alcohol use: Yes    Alcohol/week: 3.0 standard drinks    Types: 3 Cans of beer per week  . Drug use: Never     Medication list has been reviewed and updated.  Current Meds  Medication Sig  . amLODipine (NORVASC) 10 MG tablet Take 1 tablet (10 mg total) by mouth daily.  Marland Kitchen aspirin EC 81 MG tablet Take 81 mg by mouth daily.  Marland Kitchen atorvastatin (LIPITOR) 40 MG tablet Take 1 tablet (40 mg total) by mouth daily.  . cilostazol (PLETAL) 100 MG tablet Take 1 tablet twice a day  . clopidogrel (PLAVIX) 75 MG tablet Take 1 tablet (75 mg total) by mouth daily. TAKE 1 TABLET(75 MG) BY MOUTH DAILY  . lisinopril (ZESTRIL) 5 MG tablet Take 1 tablet (5 mg total) by mouth daily.  . Multiple Vitamins-Minerals (MULTIVITAMIN ADULT PO) Take 1 tablet by mouth daily.   . Vitamin D, Cholecalciferol, 1000 units CAPS Take 1 capsule by mouth daily. (Patient taking differently: Take 2,000 Units by mouth daily.)    PHQ 2/9 Scores 05/18/2020 11/15/2019 08/12/2019 05/20/2019  PHQ - 2  Score 0 0 0 0  PHQ- 9 Score 0 0 - 0    GAD 7 : Generalized Anxiety Score 05/18/2020 11/15/2019 05/20/2019  Nervous, Anxious, on Edge 0 0 0  Control/stop worrying 0 0 0  Worry too much - different things 0 0 0  Trouble relaxing 0 0 0  Restless 0 0 0  Easily annoyed or irritable 0 0 0  Afraid - awful might happen 0 0 0  Total GAD 7 Score 0 0 0    BP Readings from Last 3 Encounters:  07/16/20 130/60  07/02/20 124/70  06/30/20 124/69    Physical Exam Vitals and nursing note reviewed.  HENT:     Head: Normocephalic.     Right Ear: External ear normal.     Left Ear: External ear normal.     Nose: Nose normal.  Eyes:     General: No scleral icterus.       Right eye: No discharge.        Left eye: No discharge.     Conjunctiva/sclera:  Conjunctivae normal.     Pupils: Pupils are equal, round, and reactive to light.  Neck:     Thyroid: No thyromegaly.     Vascular: No JVD.     Trachea: No tracheal deviation.  Cardiovascular:     Rate and Rhythm: Normal rate and regular rhythm.     Heart sounds: Normal heart sounds. No murmur heard. No friction rub. No gallop.   Pulmonary:     Effort: No respiratory distress.     Breath sounds: Normal breath sounds. No wheezing or rales.  Abdominal:     General: Bowel sounds are normal.     Palpations: Abdomen is soft. There is no hepatomegaly, splenomegaly or mass.     Tenderness: There is no abdominal tenderness. There is no guarding or rebound.  Musculoskeletal:        General: No tenderness. Normal range of motion.     Cervical back: Normal range of motion and neck supple.  Lymphadenopathy:     Cervical: No cervical adenopathy.  Skin:    General: Skin is warm.     Findings: No rash.  Neurological:     Mental Status: He is alert and oriented to person, place, and time.     Cranial Nerves: No cranial nerve deficit.     Deep Tendon Reflexes: Reflexes are normal and symmetric.     Wt Readings from Last 3 Encounters:  07/16/20 133 lb (60.3 kg)  07/02/20 134 lb (60.8 kg)  06/30/20 135 lb (61.2 kg)    BP 130/60   Pulse 88   Ht 5\' 7"  (1.702 m)   Wt 133 lb (60.3 kg)   BMI 20.83 kg/m   Assessment and Plan: 1. Anemia, macrocytic Chronic.  Mild.  Persistent.  Patient has had a mild decrease in his hemoglobin and hematocrit with a macrocytic indices.  On evaluation of F02 and folic acid these were noted to be in normal range.  On questioning patient had a GIST tumor of the stomach that was removed presumably with a partial gastrectomy.  I am wondering if there may be in removal of enough of the intrinsic factor generating cells that may be contributing to a mild macrocytic anemia. - Ambulatory referral to Gastroenterology  2. Belching symptom Chronic.  Controlled.  Stable.   Patient does admit to having some belching symptoms most recently.  Early on in the problems with his gastric circumstance patient had gastroparesis  but does not have any presently. - Ambulatory referral to Gastroenterology  3. History of partial gastrectomy As noted above there was a partial gastrectomy due to gastric tumor that was thought to be benign. - Ambulatory referral to Gastroenterology  4. Weight loss New onset.  It has been noted that there is been a significant weight loss since the  time patient was seen on 05/18/2020.  Patient has lost 5 pounds.  Given the weight loss history of a GIST tumor that was removed by previous surgery some upper GI complaints of belching and anemia I will refer to gastroenterology and have spoke to Dr. Allen Norris about my concerns and he will proceed with further evaluation. - Ambulatory referral to Gastroenterology  5. History of gastrointestinal stromal tumor (GIST) As noted above patient does have a history of a GIST tumor for which it was removed by general surgery several years ago. - Ambulatory referral to Gastroenterology

## 2020-08-12 ENCOUNTER — Ambulatory Visit (INDEPENDENT_AMBULATORY_CARE_PROVIDER_SITE_OTHER): Payer: Medicare Other

## 2020-08-12 DIAGNOSIS — Z0000 Encounter for general adult medical examination without abnormal findings: Secondary | ICD-10-CM | POA: Diagnosis not present

## 2020-08-12 NOTE — Patient Instructions (Signed)
[name] , Thank you for taking time to come for your Medicare Wellness Visit. I appreciate your ongoing commitment to your health goals. Please review the following plan we discussed and let me know if I can assist you in the future.   Screening recommendations/referrals: Colonoscopy: no longer required Recommended yearly ophthalmology/optometry visit for glaucoma screening and checkup Recommended yearly dental visit for hygiene and checkup  Vaccinations: Influenza vaccine: done 11/15/19 Pneumococcal vaccine: done 05/20/19 Tdap vaccine: done 05/16/17 Shingles vaccine: done 05/16/17 & 07/26/17   Covid-19: done 02/28/19, 03/22/19 & 12/08/19  Advanced directives: Please bring a copy of your health care power of attorney and living will to the office at your convenience.   Conditions/risks identified: If you wish to quit smoking, help is available. For free tobacco cessation program offerings call the [facility] at 7187368803 or Live Well Line at [phone]. You may also visit [email or web address] or email [email or web address] for more information on other programs.   Next appointment: Follow up in one year for your annual wellness visit.   Preventive Care 109 Years and Older, Male Preventive care refers to lifestyle choices and visits with your health care provider that can promote health and wellness. What does preventive care include? A yearly physical exam. This is also called an annual well check. Dental exams once or twice a year. Routine eye exams. Ask your health care provider how often you should have your eyes checked. Personal lifestyle choices, including: Daily care of your teeth and gums. Regular physical activity. Eating a healthy diet. Avoiding tobacco and drug use. Limiting alcohol use. Practicing safe sex. Taking low doses of aspirin every day. Taking vitamin and mineral supplements as recommended by your health care provider. What happens during an  annual well check? The services and screenings done by your health care provider during your annual well check will depend on your age, overall health, lifestyle risk factors, and family history of disease. Counseling  Your health care provider may ask you questions about your: Alcohol use. Tobacco use. Drug use. Emotional well-being. Home and relationship well-being. Sexual activity. Eating habits. History of falls. Memory and ability to understand (cognition). Work and work Statistician. Screening  You may have the following tests or measurements: Height, weight, and BMI. Blood pressure. Lipid and cholesterol levels. These may be checked every 5 years, or more frequently if you are over 72 years old. Skin check. Lung cancer screening. You may have this screening every year starting at age 15 if you have a 30-pack-year history of smoking and currently smoke or have quit within the past 15 years. Fecal occult blood test (FOBT) of the stool. You may have this test every year starting at age 17. Flexible sigmoidoscopy or colonoscopy. You may have a sigmoidoscopy every 5 years or a colonoscopy every 10 years starting at age 82. Prostate cancer screening. Recommendations will vary depending on your family history and other risks. Hepatitis C blood test. Hepatitis B blood test. Sexually transmitted disease (STD) testing. Diabetes screening. This is done by checking your blood sugar (glucose) after you have not eaten for a while (fasting). You may have this done every 1-3 years. Abdominal aortic aneurysm (AAA) screening. You may need this if you are a current or former smoker. Osteoporosis. You may be screened starting at age 75 if you are at high risk. Talk with your health care provider about your test results, treatment options, and if necessary, the need for more tests. Vaccines  Your  health care provider may recommend certain vaccines, such as: Influenza vaccine. This is recommended  every year. Tetanus, diphtheria, and acellular pertussis (Tdap, Td) vaccine. You may need a Td booster every 10 years. Zoster vaccine. You may need this after age 47. Pneumococcal 13-valent conjugate (PCV13) vaccine. One dose is recommended after age 31. Pneumococcal polysaccharide (PPSV23) vaccine. One dose is recommended after age 46. Talk to your health care provider about which screenings and vaccines you need and how often you need them. This information is not intended to replace advice given to you by your health care provider. Make sure you discuss any questions you have with your health care provider. Document Released: 02/27/2015 Document Revised: 10/21/2015 Document Reviewed: 12/02/2014 Elsevier Interactive Patient Education  2017 [place] Prevention in the Home Falls can cause injuries. They can happen to people of all ages. There are many things you can do to make your home safe and to help prevent falls. What can I do on the outside of my home? Regularly fix the edges of walkways and driveways and fix any cracks. Remove anything that might make you trip as you walk through a door, such as a raised step or threshold. Trim any bushes or trees on the path to your home. Use bright outdoor lighting. Clear any walking paths of anything that might make someone trip, such as rocks or tools. Regularly check to see if handrails are loose or broken. Make sure that both sides of any steps have handrails. Any raised decks and porches should have guardrails on the edges. Have any leaves, snow, or ice cleared regularly. Use sand or salt on walking paths during winter. Clean up any spills in your garage right away. This includes oil or grease spills. What can I do in the bathroom? Use night lights. Install grab bars by the toilet and in the tub and shower. Do not use towel bars as grab bars. Use non-skid mats or decals in the tub or shower. If you need to sit down in the shower,  use a plastic, non-slip stool. Keep the floor dry. Clean up any water that spills on the floor as soon as it happens. Remove soap buildup in the tub or shower regularly. Attach bath mats securely with double-sided non-slip rug tape. Do not have throw rugs and other things on the floor that can make you trip. What can I do in the bedroom? Use night lights. Make sure that you have a light by your bed that is easy to reach. Do not use any sheets or blankets that are too big for your bed. They should not hang down onto the floor. Have a firm chair that has side arms. You can use this for support while you get dressed. Do not have throw rugs and other things on the floor that can make you trip. What can I do in the kitchen? Clean up any spills right away. Avoid walking on wet floors. Keep items that you use a lot in easy-to-reach places. If you need to reach something above you, use a strong step stool that has a grab bar. Keep electrical cords out of the way. Do not use floor polish or wax that makes floors slippery. If you must use wax, use non-skid floor wax. Do not have throw rugs and other things on the floor that can make you trip. What can I do with my stairs? Do not leave any items on the stairs. Make sure that there are handrails  on both sides of the stairs and use them. Fix handrails that are broken or loose. Make sure that handrails are as long as the stairways. Check any carpeting to make sure that it is firmly attached to the stairs. Fix any carpet that is loose or worn. Avoid having throw rugs at the top or bottom of the stairs. If you do have throw rugs, attach them to the floor with carpet tape. Make sure that you have a light switch at the top of the stairs and the bottom of the stairs. If you do not have them, ask someone to add them for you. What else can I do to help prevent falls? Wear shoes that: Do not have high heels. Have rubber bottoms. Are comfortable and fit you  well. Are closed at the toe. Do not wear sandals. If you use a stepladder: Make sure that it is fully opened. Do not climb a closed stepladder. Make sure that both sides of the stepladder are locked into place. Ask someone to hold it for you, if possible. Clearly mark and make sure that you can see: Any grab bars or handrails. First and last steps. Where the edge of each step is. Use tools that help you move around (mobility aids) if they are needed. These include: Canes. Walkers. Scooters. Crutches. Turn on the lights when you go into a dark area. Replace any light bulbs as soon as they burn out. Set up your furniture so you have a clear path. Avoid moving your furniture around. If any of your floors are uneven, fix them. If there are any pets around you, be aware of where they are. Review your medicines with your doctor. Some medicines can make you feel dizzy. This can increase your chance of falling. Ask your doctor what other things that you can do to help prevent falls. This information is not intended to replace advice given to you by your health care provider. Make sure you discuss any questions you have with your health care provider. Document Released: 11/27/2008 Document Revised: 07/09/2015 Document Reviewed: 03/07/2014 Elsevier Interactive Patient Education  2017 [employer].

## 2020-08-12 NOTE — Progress Notes (Signed)
Subjective:   Dylan Villanueva is a 80 y.o. male who presents for Medicare Annual/Subsequent preventive examination.  Virtual Visit via Telephone Note  I connected with  Hussam Muniz on 08/12/20 at  8:40 AM EDT by telephone and verified that I am speaking with the correct person using two identifiers.  Location: Patient: home Provider: [facility] Persons participating in the virtual visit: [place]   I discussed the limitations, risks, security and privacy concerns of performing an evaluation and management service by telephone and the availability of in person appointments. The patient expressed understanding and agreed to proceed.  Interactive audio and video telecommunications were attempted between this nurse and patient, however failed, due to patient having technical difficulties OR patient did not have access to video capability.  We continued and completed visit with audio only.  Some vital signs may be absent or patient reported.   Clemetine Marker, LPN   Review of Systems     Cardiac Risk Factors include: advanced age (>30men, >46 women);dyslipidemia;male gender;hypertension;smoking/ tobacco exposure     Objective:    Today's Vitals   08/12/20 0829  PainSc: 2    There is no height or weight on file to calculate BMI.  Advanced Directives 08/12/2020 09/23/2019 09/13/2019 08/12/2019 09/12/2018 08/10/2018 08/08/2018  Does Patient Have a Medical Advance Directive? Yes Yes Yes Yes Yes Yes Yes  Type of Paramedic of [place];Living will Healthcare Power of [place];Living will [place];Living will [place];Living will [place];Living will [place];Living will  Does patient want to make changes to medical advance directive? - No - Patient declined - - No - Patient declined No - Patient declined -  Copy of Experiment in Chart? No  - copy requested No - copy requested No - copy requested No - copy requested No - copy requested No - copy requested No - copy requested    Current Medications (verified) Outpatient Encounter Medications as of 08/12/2020  Medication Sig   amLODipine (NORVASC) 10 MG tablet Take 1 tablet (10 mg total) by mouth daily.   aspirin EC 81 MG tablet Take 81 mg by mouth daily.   atorvastatin (LIPITOR) 40 MG tablet Take 1 tablet (40 mg total) by mouth daily.   cilostazol (PLETAL) 100 MG tablet Take 1 tablet twice a day   clopidogrel (PLAVIX) 75 MG tablet Take 1 tablet (75 mg total) by mouth daily. TAKE 1 TABLET(75 MG) BY MOUTH DAILY   lisinopril (ZESTRIL) 5 MG tablet Take 1 tablet (5 mg total) by mouth daily.   Multiple Vitamins-Minerals (MULTIVITAMIN ADULT PO) Take 1 tablet by mouth daily.    Vitamin D, Cholecalciferol, 1000 units CAPS Take 1 capsule by mouth daily. (Patient taking differently: Take 1,000 Units by mouth daily.)   No facility-administered encounter medications on file as of 08/12/2020.    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[place])    Smoker    Squamous acanthoma of external ear, right    Past Surgical History:  Procedure Laterality Date   [place]  COLONOSCOPY WITH PROPOFOL N/A 03/02/2018   Procedure: COLONOSCOPY WITH PROPOFOL;  Surgeon: [name], MD;  Location: [facility];  Service: Endoscopy;  Laterality: N/A;   CORONARY ANGIOPLASTY WITH STENT PLACEMENT     CYSTOSCOPY W/ RETROGRADES Bilateral 09/23/2019   Procedure: CYSTOSCOPY WITH RETROGRADE PYELOGRAM;  Surgeon: Hollice Espy, MD;  Location: [facility];  Service: Urology;  Laterality: Bilateral;   CYSTOSCOPY WITH  URETHRAL DILATATION N/A 09/23/2019   Procedure: CYSTOSCOPY WITH URETHRAL DILATATION;  Surgeon: Hollice Espy, MD;  Location: [facility];  Service: Urology;  Laterality: N/A;   ESOPHAGOGASTRODUODENOSCOPY N/A 11/07/2017   Procedure: ESOPHAGOGASTRODUODENOSCOPY (EGD);  Surgeon: Jules Husbands, MD;  Location: [facility];  Service: General;  Laterality: N/A;   ESOPHAGOGASTRODUODENOSCOPY (EGD) WITH PROPOFOL  03/02/2018   Procedure: ESOPHAGOGASTRODUODENOSCOPY (EGD) WITH PROPOFOL;  Surgeon: [name], MD;  Location: [place];  Service: Endoscopy;;   EUS N/A 07/13/2017   Procedure: FULL UPPER ENDOSCOPIC ULTRASOUND (EUS) RADIAL;  Surgeon: Jola Schmidt, MD;  Location: [facility];  Service: Endoscopy;  Laterality: N/A;   HIGH DOSE RADIATION IMPLANT INSERTION  2006   LAPAROSCOPIC REMOVAL ABDOMINAL MASS N/A 11/07/2017   Procedure: LAPAROSCOPIC GASTRIC MASS RESECTION;  Surgeon: Jules Husbands, MD;  Location: [facility];  Service: General;  Laterality: N/A;   LOWER EXTREMITY ANGIOGRAPHY Left 07/04/2018   Procedure: LOWER EXTREMITY ANGIOGRAPHY;  Surgeon: Katha Cabal, MD;  Location: [place] [facility];  Service: Cardiovascular;  Laterality: Left;   LOWER EXTREMITY ANGIOGRAPHY Left 08/10/2018   Procedure: LOWER EXTREMITY ANGIOGRAPHY;  Surgeon: Katha Cabal, MD;  Location: [place] [facility];  Service: Cardiovascular;  Laterality: Left;   LOWER EXTREMITY ANGIOGRAPHY Left 09/12/2018   Procedure: LOWER EXTREMITY ANGIOGRAPHY;  Surgeon: Katha Cabal, MD;  Location: [place] [facility];  Service: Cardiovascular;  Laterality: Left;   POLYPECTOMY  03/02/2018   Procedure: POLYPECTOMY;  Surgeon: [name], MD;  Location: [facility];  Service: Endoscopy;;   TRANSURETHRAL RESECTION OF BLADDER TUMOR N/A 09/23/2019   Procedure: TRANSURETHRAL RESECTION OF BLADDER TUMOR (TURBT);  Surgeon: Hollice Espy, MD;  Location: [facility];  Service: Urology;  Laterality: N/A;   Family History  Problem  Relation Age of Onset   Cancer Mother        pancreatic   Dementia Father    Cancer Sister        breast    Aneurysm Brother    Bladder Cancer Neg Hx    Prostate cancer Neg Hx    Kidney cancer Neg Hx    Social History   Socioeconomic History   Marital status: Married    Spouse name: Jana Half   Number of children: 2   Years of education: Not on file   Highest education level: Bachelor's degree (e.g., BA, AB, BS)  Occupational History   Occupation: retired   Tobacco Use   Smoking status: Every Day    Packs/day: 1.00    Years: 66.00    Pack years: 66.00    Types: Cigarettes    Start date: 11/18/1952   Smokeless tobacco: Never   Tobacco comments:         Vaping Use   Vaping Use: Never used  Substance and Sexual Activity   Alcohol use: Yes    Alcohol/week: 3.0 standard drinks    Types: 3 Cans of beer per week   Drug use: Never   Sexual activity: Not Currently  Other Topics Concern   Not on file  Social History Narrative  Not on file   Social Determinants of Health   Financial Resource Strain: Low Risk    Difficulty of Paying Living Expenses: Not hard at all  Food Insecurity: No Food Insecurity   Worried About Charity fundraiser in the Last Year: Never true   Person in the Last Year: Never true  Transportation Needs: No Transportation Needs   Lack of Transportation (Medical): No   Lack of Transportation (Non-Medical): No  Physical Activity: Inactive   Days of Exercise per Week: 0 days   Minutes of Exercise per Session: 0 min  Stress: No Stress Concern Present   Feeling of Stress : Not at all  Social Connections: Moderately Isolated   Frequency of Communication with Friends and Family: More than three times a week   Frequency of Social Gatherings with Friends and Family: Three times a week   Attends Religious Services: Never   Active Member of Clubs or Organizations: No   Attends Archivist Meetings: Never   Marital Status: Married     Tobacco Counseling Ready to quit: No Counseling given: Not Answered Tobacco comments:       Clinical Intake:  Pre-visit preparation completed: Yes  Pain : 0-10 Pain Score: 2  Pain Type: Acute pain Pain Location: Mouth (recent dental work) Pain Onset: 1 to 4 weeks ago Pain Frequency: Intermittent     Nutritional Risks: None Diabetes: No  How often do you need to have someone help you when you read instructions, pamphlets, or other written materials from your doctor or pharmacy?: 1 - Never  Diabetic?  Interpreter Needed?: No  Information entered by :: Clemetine Marker LPN   Activities of Daily Living In your present state of health, do you have any difficulty performing the following activities: 08/12/2020 09/13/2019  Hearing? Y N  Comment qualifies for hearing -  Vision? N N  Difficulty concentrating or making decisions? N N  Walking or climbing stairs? Y N  Dressing or bathing? N N  Doing errands, shopping? N N  Preparing Food and eating ? N -  Using the Toilet? N -  In the past six months, have you accidently leaked urine? Y -  Comment wears pad for protection -  Do you have problems with loss of bowel control? N -  Managing your Medications? N -  Managing your Finances? N -  Housekeeping or managing your Housekeeping? N -  Some recent data might be hidden    Patient Care Team: Juline Patch, MD as PCP - General (Family Medicine) Corey Skains, MD as Consulting Physician (Cardiology) Hollice Espy, MD as Consulting Physician (Urology) Schnier, Dolores Lory, MD (Vascular Surgery) [name], MD as Consulting Physician (Gastroenterology)  Indicate any recent Medical Services you may have received from other than [facility] providers in the past year (date may be approximate).     Assessment:   This is a routine wellness examination for [place].  Hearing/Vision screen Hearing Screening - Comments:: Pt c/o mild hearing loss; had hearing evaluation and  qualifies for hearing aids; pt planning to follow up for hearing aid options  Vision Screening - Comments:: Past due for eye exam; declines referral   Dietary issues and exercise activities discussed: Current Exercise Habits: The patient does not participate in regular exercise at present, Exercise limited by: Other - see comments (vascular conditions)   Goals Addressed             This Visit's Progress  DIET - INCREASE WATER INTAKE   On track    Recommend to drink at least 6-8 8oz glasses of water per day.        Depression Screen PHQ 2/9 Scores 08/12/2020 05/18/2020 11/15/2019 08/12/2019 05/20/2019 08/08/2018 06/08/2018  PHQ - 2 Score 0 0 0 0 0 0 0  PHQ- 9 Score - 0 0 - 0 0 0    Fall Risk Fall Risk  08/12/2020 11/15/2019 08/12/2019 05/20/2019 11/19/2018  Falls in the past year? 0 0 0 0 0  Number falls in past yr: 0 - 0 - 0  Injury with Fall? 0 - 0 - 0  Risk for fall due to : No Fall Risks - No Fall Risks - -  Risk for fall due to: Comment - - - - -  Follow up Falls prevention discussed Falls evaluation completed Falls prevention discussed Falls evaluation completed -    FALL RISK PREVENTION PERTAINING TO THE HOME:  Any stairs in or around the home? Yes  If so, are there any without handrails? No  Home free of loose throw rugs in walkways, pet beds, electrical cords, etc? Yes  Adequate lighting in your home to reduce risk of falls? Yes   ASSISTIVE DEVICES UTILIZED TO PREVENT FALLS:  Life alert? No  Use of a cane, walker or w/c? No  Grab bars in the bathroom? Yes  Shower chair or bench in shower? No  Elevated toilet seat or a handicapped toilet? Yes  TIMED UP AND GO:  Was the test performed? No . Telephonic visit.   Cognitive Function: Normal cognitive status assessed by direct observation by this Nurse Health Advisor. No abnormalities found.       6CIT Screen 08/12/2019 08/08/2018 06/01/2017  What Year? 0 points 0 points 0 points  What month? 0 points 0 points 0 points   What time? 0 points 0 points 0 points  Count back from 20 0 points 0 points 0 points  Months in reverse 0 points 0 points 0 points  Repeat phrase 0 points 0 points 0 points  Total Score 0 0 0    Immunizations Immunization History  Administered Date(s) Administered   Fluad Quad(high Dose 65+) 11/15/2019   Influenza, High Dose Seasonal PF 01/20/2017, 12/13/2017, 11/10/2018   PFIZER(Purple Top)SARS-COV-2 Vaccination 02/28/2019, 03/22/2019, 12/08/2019   Pneumococcal Conjugate-13 05/08/2017   Pneumococcal Polysaccharide-23 05/20/2019   Tdap 05/16/2017   Zoster Recombinat (Shingrix) 05/16/2017, 07/26/2017    TDAP status: Up to date  Flu Vaccine status: Up to date  Pneumococcal vaccine status: Up to date  Covid-19 vaccine status: Completed vaccines  Qualifies for Shingles Vaccine? Yes   Zostavax completed Yes   Shingrix Completed?: Yes  Screening Tests Health Maintenance  Topic Date Due   COVID-19 Vaccine (4 - Booster for Pfizer series) 03/09/2020   INFLUENZA VACCINE  09/14/2020   TETANUS/TDAP  05/17/2027   PNA vac Low Risk Adult  Completed   Zoster Vaccines- Shingrix  Completed   HPV VACCINES  Aged Out    Health Maintenance  Health Maintenance Due  Topic Date Due   COVID-19 Vaccine (4 - Booster for Pfizer series) 03/09/2020    Colorectal cancer screening: No longer required.   Lung Cancer Screening: (Low Dose CT Chest recommended if Age 13-80 years, 30 pack-year currently smoking OR have quit w/in 15years.) does qualify due to smoking history but past age limit; pt declines screening.   Additional Screening:  Hepatitis C Screening: does not qualify.  Vision Screening: Recommended  annual ophthalmology exams for early detection of glaucoma and other disorders of the eye. Is the patient up to date with their annual eye exam?  No  Who is the provider or what is the name of the office in which the patient attends annual eye exams? Not established.   Dental  Screening: Recommended annual dental exams for proper oral hygiene  Community Resource Referral / Chronic Care Management: CRR required this visit?  No   CCM required this visit?  No      Plan:     I have personally reviewed and noted the following in the patient's chart:   Medical and social history Use of alcohol, tobacco or illicit drugs  Current medications and supplements including opioid prescriptions. Patient is not currently taking opioid prescriptions. Functional ability and status Nutritional status Physical activity Advanced directives List of other physicians Hospitalizations, surgeries, and ER visits in previous 12 months Vitals Screenings to include cognitive, depression, and falls Referrals and appointments  In addition, I have reviewed and discussed with patient certain preventive protocols, quality metrics, and best practice recommendations. A written personalized care plan for preventive services as well as general preventive health recommendations were provided to patient.     Clemetine Marker, LPN   [id number]   Nurse Notes: none

## 2020-08-14 ENCOUNTER — Other Ambulatory Visit: Payer: Self-pay | Admitting: Family Medicine

## 2020-08-14 DIAGNOSIS — I70219 Atherosclerosis of native arteries of extremities with intermittent claudication, unspecified extremity: Secondary | ICD-10-CM

## 2020-09-13 NOTE — Progress Notes (Signed)
Primary Care Physician: Juline Patch, MD  Primary Gastroenterologist:  Dr. [name]  Chief Complaint  Patient presents with   New Patient (Initial Visit)    Anemia Belching     HPI: Dylan Villanueva is a 80 y.o. male here with a history of having a colonoscopy in 2020 with 2 polyps removed at that time that were adenomatous.  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 The patient now comes with a report of anemia and belching.  The patient's most recent blood work showed his hemoglobin and hematocrit to be:  Component     Latest Ref Rng & Units 09/16/2019 12/31/2019 05/18/2020 06/30/2020  Hemoglobin     13.0 - 17.7 g/dL 13.7  12.9 (L)   HCT     37.5 - 51.0 % 37.6 (L)  37.1 (L)    Component     Latest Ref Rng & Units 07/15/2020  Hemoglobin     13.0 - 17.7 g/dL 12.9 (L)  HCT     37.5 - 51.0 % 37.7   The patient's ferritin was also sent off and was normal at 60.The patient's MCV has been normal or high since 2019 and has shown a trend of:  Component     Latest Ref Rng & Units 11/07/2017 03/06/2018 09/16/2019 12/31/2019  MCV     79 - 97 fL 101.6 (H) 100.7 (H) 95.9    Component     Latest Ref Rng & Units 05/18/2020 06/30/2020 07/15/2020  MCV     79 - 97 fL 97  100 (H)   The patient also had Hemoccult sent off of his stools which were Hemoccult negative. His folate and B12 were also normal but there were no iron studies sent except for his ferritin which is stated above was normal.  The patient endoscopy note from his postsurgical follow-up showed that only the nodule was removed and the patient did not have a antrectomy but rather the nodule was removed in the area close to the GE junction.  The patient's pictures on his EGD showed a normal antrum.  The patient also reports that he has had an issue with soft stools although not watery.  He reports that he has to use more than a usual amount of solid paper to clean  himself.  Past Medical History:  Diagnosis Date   Cancer [facility]) 2006   prostate    Colon polyps    Coronary artery disease    Dyspnea    With exertion climbing up hill   Hyperlipidemia    Hypertension    Myocardial infarction [facility])    Prostate CA (HCC)    Smoker    Squamous acanthoma of external ear, right     Current Outpatient Medications  Medication Sig Dispense Refill   amLODipine (NORVASC) 10 MG tablet Take 1 tablet (10 mg total) by mouth daily. 90 tablet 1   aspirin EC 81 MG tablet Take 81 mg by mouth daily.     atorvastatin (LIPITOR) 40 MG tablet Take 1 tablet (40 mg total) by mouth daily. 90 tablet 1   cilostazol (PLETAL) 100 MG tablet TAKE 1 TABLET(100 MG) BY MOUTH TWICE DAILY 180 tablet 1   clopidogrel (PLAVIX) 75 MG tablet Take 1 tablet (75 mg total) by mouth daily. TAKE 1 TABLET(75 MG) BY MOUTH DAILY 90 tablet 1   lisinopril (ZESTRIL) 5 MG tablet Take 1 tablet (5 mg total) by mouth daily.  90 tablet 1   Multiple Vitamins-Minerals (MULTIVITAMIN ADULT PO) Take 1 tablet by mouth daily.      Vitamin D, Cholecalciferol, 1000 units CAPS Take 1 capsule by mouth daily. (Patient taking differently: Take 1,000 Units by mouth daily.) 60 capsule 3   No current facility-administered medications for this visit.    Allergies as of 09/14/2020 - Review Complete 09/14/2020  Allergen Reaction Noted   Penicillins Nausea Only 05/03/2017   Sulfa antibiotics Diarrhea 09/13/2019    ROS:  General: Negative for anorexia, weight loss, fever, chills, fatigue, weakness. ENT: Negative for hoarseness, difficulty swallowing , nasal congestion. CV: Negative for chest pain, angina, palpitations, dyspnea on exertion, peripheral edema.  Respiratory: Negative for dyspnea at rest, dyspnea on exertion, cough, sputum, wheezing.  GI: See history of present illness. GU:  Negative for dysuria, hematuria, urinary incontinence, urinary frequency, nocturnal urination.  Endo: Negative for unusual weight  change.    Physical Examination:   BP 114/64 (BP Location: Left Arm, Patient Position: Sitting, Cuff Size: Normal)   Pulse 74   Temp 98 F (36.7 C) (Temporal)   Ht '5\' 7"'$  (1.702 m)   Wt 135 lb 3.2 oz (61.3 kg)   BMI 21.18 kg/m   General: Well-nourished, well-developed in no acute distress.  Eyes: No icterus. Conjunctivae pink. Neuro: Alert and oriented x 3.  Grossly intact. Skin: Warm and dry, no jaundice.   Psych: Alert and cooperative, normal mood and affect.  Labs:    Imaging Studies: No results found.  Assessment and Plan:   Dylan Villanueva is a 80 y.o. y/o male comes in today with a history of soft stools and a macrocytic anemia.  The patient had a normal ferritin but no iron studies recently.  The patient's folate and B12 were normal.  There was a concern that this patient may be lacking intrinsic factor due to his gastrectomy but on review of the post op note and postop endoscopy there was only a resection of the gastrointestinal stromal tumor with margins and a intact stomach Including the antrum. The patient will have his blood sent off for iron studies.  He will also try and increase fiber in his diet and back off on his dairy products since he states that he has a bowl of cereal every morning and a glass of milk with every meal.  The patient has been explained the plan and agrees with it.      [name], MD. Marval Regal    Note: This dictation was prepared with Dragon dictation along with smaller phrase technology. Any transcriptional errors that result from this process are unintentional.

## 2020-09-14 ENCOUNTER — Encounter: Payer: Self-pay | Admitting: Gastroenterology

## 2020-09-14 ENCOUNTER — Ambulatory Visit (INDEPENDENT_AMBULATORY_CARE_PROVIDER_SITE_OTHER): Payer: Medicare Other | Admitting: Gastroenterology

## 2020-09-14 VITALS — BP 114/64 | HR 74 | Temp 98.0°F | Ht 67.0 in | Wt 135.2 lb

## 2020-09-14 DIAGNOSIS — I6523 Occlusion and stenosis of bilateral carotid arteries: Secondary | ICD-10-CM

## 2020-09-14 DIAGNOSIS — R194 Change in bowel habit: Secondary | ICD-10-CM | POA: Diagnosis not present

## 2020-09-14 DIAGNOSIS — D500 Iron deficiency anemia secondary to blood loss (chronic): Secondary | ICD-10-CM | POA: Diagnosis not present

## 2020-09-15 LAB — IRON AND TIBC
Iron Saturation: 39 % (ref 15–55)
Iron: 114 ug/dL (ref 38–169)
Total Iron Binding Capacity: 293 ug/dL (ref 250–450)
UIBC: 179 ug/dL (ref 111–343)

## 2020-09-15 LAB — FERRITIN: Ferritin: 87 ng/mL (ref 30–400)

## 2020-09-16 ENCOUNTER — Telehealth: Payer: Self-pay

## 2020-09-16 NOTE — Telephone Encounter (Signed)
Pt notified of lab results

## 2020-09-16 NOTE — Telephone Encounter (Signed)
-----   Message from [name], MD sent at 09/15/2020  7:54 AM EDT ----- Let the patient know that the iron studies were completely normal.  Therefore it does not appear that he is losing blood.

## 2020-10-07 DIAGNOSIS — Z23 Encounter for immunization: Secondary | ICD-10-CM | POA: Diagnosis not present

## 2020-10-13 DIAGNOSIS — I6523 Occlusion and stenosis of bilateral carotid arteries: Secondary | ICD-10-CM | POA: Diagnosis not present

## 2020-10-13 DIAGNOSIS — I2510 Atherosclerotic heart disease of native coronary artery without angina pectoris: Secondary | ICD-10-CM | POA: Diagnosis not present

## 2020-10-13 DIAGNOSIS — E782 Mixed hyperlipidemia: Secondary | ICD-10-CM | POA: Diagnosis not present

## 2020-10-13 DIAGNOSIS — I70219 Atherosclerosis of native arteries of extremities with intermittent claudication, unspecified extremity: Secondary | ICD-10-CM | POA: Diagnosis not present

## 2020-10-13 DIAGNOSIS — F17200 Nicotine dependence, unspecified, uncomplicated: Secondary | ICD-10-CM | POA: Diagnosis not present

## 2020-10-13 DIAGNOSIS — I10 Essential (primary) hypertension: Secondary | ICD-10-CM | POA: Diagnosis not present

## 2020-11-23 ENCOUNTER — Other Ambulatory Visit: Payer: Self-pay

## 2020-11-23 ENCOUNTER — Encounter: Payer: Self-pay | Admitting: Family Medicine

## 2020-11-23 ENCOUNTER — Ambulatory Visit (INDEPENDENT_AMBULATORY_CARE_PROVIDER_SITE_OTHER): Payer: Medicare Other | Admitting: Family Medicine

## 2020-11-23 VITALS — BP 130/58 | HR 83 | Ht 67.0 in | Wt 134.0 lb

## 2020-11-23 DIAGNOSIS — I70219 Atherosclerosis of native arteries of extremities with intermittent claudication, unspecified extremity: Secondary | ICD-10-CM | POA: Diagnosis not present

## 2020-11-23 DIAGNOSIS — I739 Peripheral vascular disease, unspecified: Secondary | ICD-10-CM

## 2020-11-23 DIAGNOSIS — E782 Mixed hyperlipidemia: Secondary | ICD-10-CM

## 2020-11-23 DIAGNOSIS — I6523 Occlusion and stenosis of bilateral carotid arteries: Secondary | ICD-10-CM | POA: Diagnosis not present

## 2020-11-23 DIAGNOSIS — I2510 Atherosclerotic heart disease of native coronary artery without angina pectoris: Secondary | ICD-10-CM | POA: Diagnosis not present

## 2020-11-23 DIAGNOSIS — Z23 Encounter for immunization: Secondary | ICD-10-CM | POA: Diagnosis not present

## 2020-11-23 DIAGNOSIS — I10 Essential (primary) hypertension: Secondary | ICD-10-CM

## 2020-11-23 MED ORDER — LISINOPRIL 5 MG PO TABS: 5.0000 mg | ORAL_TABLET | Freq: Every day | ORAL | 1 refills | Status: DC

## 2020-11-23 MED ORDER — AMLODIPINE BESYLATE 10 MG PO TABS: 10.0000 mg | ORAL_TABLET | Freq: Every day | ORAL | 1 refills | Status: DC

## 2020-11-23 MED ORDER — CLOPIDOGREL BISULFATE 75 MG PO TABS: 75.0000 mg | ORAL_TABLET | Freq: Every day | ORAL | 1 refills | Status: DC

## 2020-11-23 MED ORDER — ATORVASTATIN CALCIUM 40 MG PO TABS: 40.0000 mg | ORAL_TABLET | Freq: Every day | ORAL | 1 refills | Status: DC

## 2020-11-23 NOTE — Progress Notes (Addendum)
Date:  11/23/2020   Name:  Dylan Villanueva   DOB:  05-Oct-1940   MRN:  [id number]   Chief Complaint: No chief complaint on file.  Hypertension Pertinent negatives include no chest pain, palpitations or shortness of breath. Past treatments include calcium channel blockers and ACE inhibitors. The current treatment provides moderate improvement. There are no compliance problems.  There is no history of angina, kidney disease, CAD/MI, CVA, heart failure, left ventricular hypertrophy, PVD or retinopathy. There is no history of chronic renal disease, a hypertension causing med or renovascular disease.  Hyperlipidemia This is a chronic problem. The current episode started more than 1 year ago. The problem is controlled. Recent lipid tests were reviewed and are normal. He has no history of chronic renal disease, diabetes, hypothyroidism, liver disease, obesity or nephrotic syndrome. Pertinent negatives include no chest pain, myalgias or shortness of breath. Current antihyperlipidemic treatment includes statins. The current treatment provides moderate improvement of lipids. There are no compliance problems.  Risk factors for coronary artery disease include dyslipidemia, hypertension and male sex.   Lab Results  Component Value Date   CREATININE 0.83 05/18/2020   BUN 14 05/18/2020   NA 143 05/18/2020   K 4.5 05/18/2020   CL 105 05/18/2020   CO2 25 05/18/2020   Lab Results  Component Value Date   CHOL 116 05/18/2020   HDL 61 05/18/2020   LDLCALC 44 05/18/2020   TRIG 42 05/18/2020   CHOLHDL 3.6 05/08/2017   Lab Results  Component Value Date   TSH 1.050 05/08/2017   No results found for: HGBA1C Lab Results  Component Value Date   WBC 8.6 07/15/2020   HGB 12.9 (L) 07/15/2020   HCT 37.7 07/15/2020   MCV 100 (H) 07/15/2020   PLT 219 07/15/2020   Lab Results  Component Value Date   ALT 16 05/20/2019   AST 20 05/20/2019   ALKPHOS 74 05/20/2019   BILITOT 0.3 05/20/2019     Review of  Systems  Constitutional:  Negative for fatigue and fever.  HENT:  Negative for congestion.   Eyes:  Negative for itching.  Respiratory:  Negative for shortness of breath and wheezing.   Cardiovascular:  Negative for chest pain, palpitations and leg swelling.  Gastrointestinal:  Negative for abdominal pain.  Genitourinary:  Negative for decreased urine volume and hematuria.       Stress urinary incontinence  Musculoskeletal:  Positive for arthralgias. Negative for back pain and myalgias.  Skin:  Negative for color change, pallor, rash and wound.  Hematological:  Negative for adenopathy. Does not bruise/bleed easily.   Patient Active Problem List   Diagnosis Date Noted   Current smoker 04/08/2020   Atherosclerosis of native arteries of extremity with intermittent claudication ([place]) 06/25/2018   Current every day smoker 06/25/2018   Benign gastrointestinal stromal tumor (GIST)    History of colonic polyps    Benign neoplasm of transverse colon    Benign neoplasm of ascending colon    Gastric mass    Bilateral carotid artery stenosis 08/30/2017   Benign essential HTN 05/25/2017   Non-ST elevation myocardial infarction (NSTEMI), subendocardial infarction, subsequent episode of care ([place]) 05/25/2017   Chronic venous insufficiency 05/08/2017   Coronary artery disease involving native coronary artery of native heart 05/08/2017   Hyperlipidemia, mixed 05/08/2017   Hypertension 05/08/2017   History of prostate cancer 05/08/2017   Gait instability 05/08/2017   Vitamin D deficiency 05/08/2017   Presence of stent in coronary artery in  patient with coronary artery disease 05/08/2017   History of shingles 05/08/2017    Allergies  Allergen Reactions   Penicillins Nausea Only    Has patient had a PCN reaction causing immediate rash, facial/tongue/throat swelling, SOB or lightheadedness with hypotension: no Has patient had a PCN reaction causing severe rash involving mucus membranes or skin  necrosis: no Has patient had a PCN reaction that required hospitalization: no Has patient had a PCN reaction occurring within the last 10 years: no If all of the above answers are "NO", then may proceed with Cephalosporin use.    Sulfa Antibiotics Diarrhea    Past Surgical History:  Procedure Laterality Date   APPENDECTOMY  1980   CARDIAC SURGERY     COLONOSCOPY WITH PROPOFOL N/A 03/02/2018   Procedure: COLONOSCOPY WITH PROPOFOL;  Surgeon: [name], MD;  Location: [place];  Service: Endoscopy;  Laterality: N/A;   CORONARY ANGIOPLASTY WITH STENT PLACEMENT     CYSTOSCOPY W/ RETROGRADES Bilateral 09/23/2019   Procedure: CYSTOSCOPY WITH RETROGRADE PYELOGRAM;  Surgeon: Hollice Espy, MD;  Location: [facility];  Service: Urology;  Laterality: Bilateral;   CYSTOSCOPY WITH URETHRAL DILATATION N/A 09/23/2019   Procedure: CYSTOSCOPY WITH URETHRAL DILATATION;  Surgeon: Hollice Espy, MD;  Location: [facility];  Service: Urology;  Laterality: N/A;   ESOPHAGOGASTRODUODENOSCOPY N/A 11/07/2017   Procedure: ESOPHAGOGASTRODUODENOSCOPY (EGD);  Surgeon: Jules Husbands, MD;  Location: [facility];  Service: General;  Laterality: N/A;   ESOPHAGOGASTRODUODENOSCOPY (EGD) WITH PROPOFOL  03/02/2018   Procedure: ESOPHAGOGASTRODUODENOSCOPY (EGD) WITH PROPOFOL;  Surgeon: [name], MD;  Location: [place];  Service: Endoscopy;;   EUS N/A 07/13/2017   Procedure: FULL UPPER ENDOSCOPIC ULTRASOUND (EUS) RADIAL;  Surgeon: Jola Schmidt, MD;  Location: [facility];  Service: Endoscopy;  Laterality: N/A;   HIGH DOSE RADIATION IMPLANT INSERTION  2006   LAPAROSCOPIC REMOVAL ABDOMINAL MASS N/A 11/07/2017   Procedure: LAPAROSCOPIC GASTRIC MASS RESECTION;  Surgeon: Jules Husbands, MD;  Location: [facility];  Service: General;  Laterality: N/A;   LOWER EXTREMITY ANGIOGRAPHY Left 07/04/2018   Procedure: LOWER EXTREMITY ANGIOGRAPHY;  Surgeon: Katha Cabal, MD;  Location: [place] [facility];  Service:  Cardiovascular;  Laterality: Left;   LOWER EXTREMITY ANGIOGRAPHY Left 08/10/2018   Procedure: LOWER EXTREMITY ANGIOGRAPHY;  Surgeon: Katha Cabal, MD;  Location: [place] [facility];  Service: Cardiovascular;  Laterality: Left;   LOWER EXTREMITY ANGIOGRAPHY Left 09/12/2018   Procedure: LOWER EXTREMITY ANGIOGRAPHY;  Surgeon: Katha Cabal, MD;  Location: [place] [facility];  Service: Cardiovascular;  Laterality: Left;   POLYPECTOMY  03/02/2018   Procedure: POLYPECTOMY;  Surgeon: [name], MD;  Location: [place];  Service: Endoscopy;;   TRANSURETHRAL RESECTION OF BLADDER TUMOR N/A 09/23/2019   Procedure: TRANSURETHRAL RESECTION OF BLADDER TUMOR (TURBT);  Surgeon: Hollice Espy, MD;  Location: [facility];  Service: Urology;  Laterality: N/A;    Social History   Tobacco Use   Smoking status: Every Day    Packs/day: 1.00    Years: 66.00    Pack years: 66.00    Types: Cigarettes    Start date: 11/18/1952   Smokeless tobacco: Never   Tobacco comments:         Vaping Use   Vaping Use: Never used  Substance Use Topics   Alcohol use: Yes    Alcohol/week: 3.0 standard drinks    Types: 3 Cans of beer per week   Drug use: Never     Medication list has been  reviewed and updated.  No outpatient medications have been marked as taking for the 11/23/20 encounter (Office Visit) with Juline Patch, MD.    [facility] 2/9 Scores 08/12/2020 05/18/2020 11/15/2019 08/12/2019  PHQ - 2 Score 0 0 0 0  PHQ- 9 Score - 0 0 -    GAD 7 : Generalized Anxiety Score 05/18/2020 11/15/2019 05/20/2019  Nervous, Anxious, on Edge 0 0 0  Control/stop worrying 0 0 0  Worry too much - different things 0 0 0  Trouble relaxing 0 0 0  Restless 0 0 0  Easily annoyed or irritable 0 0 0  Afraid - awful might happen 0 0 0  Total GAD 7 Score 0 0 0    BP Readings from Last 3 Encounters:  09/14/20 114/64  07/16/20 130/60  07/02/20 124/70    Physical Exam Vitals and nursing note reviewed.  HENT:      Head: Normocephalic.     Right Ear: Tympanic membrane, ear canal and external ear normal. There is no impacted cerumen.     Left Ear: Tympanic membrane, ear canal and external ear normal. There is no impacted cerumen.     Nose: Nose normal. No congestion or rhinorrhea.  Eyes:     General: No scleral icterus.       Right eye: No discharge.        Left eye: No discharge.     Conjunctiva/sclera: Conjunctivae normal.     Pupils: Pupils are equal, round, and reactive to light.  Neck:     Thyroid: No thyromegaly.     Vascular: No carotid bruit or JVD.     Trachea: No tracheal deviation.  Cardiovascular:     Rate and Rhythm: Normal rate and regular rhythm.     Heart sounds: Normal heart sounds. No murmur heard.   No friction rub. No gallop.  Pulmonary:     Effort: No respiratory distress.     Breath sounds: Normal breath sounds. No stridor. No wheezing, rhonchi or rales.  Abdominal:     General: Bowel sounds are normal.     Palpations: Abdomen is soft. There is no mass.     Tenderness: There is no abdominal tenderness. There is no guarding or rebound.  Musculoskeletal:        General: No tenderness. Normal range of motion.     Cervical back: Normal range of motion and neck supple.  Lymphadenopathy:     Cervical: No cervical adenopathy.  Skin:    General: Skin is warm.     Findings: No rash.  Neurological:     Mental Status: He is alert and oriented to person, place, and time.     Cranial Nerves: No cranial nerve deficit.     Deep Tendon Reflexes: Reflexes are normal and symmetric.    Wt Readings from Last 3 Encounters:  09/14/20 135 lb 3.2 oz (61.3 kg)  07/16/20 133 lb (60.3 kg)  07/02/20 134 lb (60.8 kg)    There were no vitals taken for this visit.  Assessment and Plan:  1. Essential hypertension Chronic.  Controlled.  Stable.  Blood pressure 130/58.  Continue amlodipine 10 mg once a day and lisinopril 5 mg once a day. - amLODipine (NORVASC) 10 MG tablet; Take 1 tablet  (10 mg total) by mouth daily.  Dispense: 90 tablet; Refill: 1 - lisinopril (ZESTRIL) 5 MG tablet; Take 1 tablet (5 mg total) by mouth daily.  Dispense: 90 tablet; Refill: 1  2. Coronary artery disease involving  native coronary artery of native heart without angina pectoris Chronic.  Stable.  Controlled.  Continue amlodipine 10 mg once a day.  As well as continuing lisinopril 5 mg once a day and Plavix 75 mg once a day.  Patient has been suggested by cardiologist to discontinue aspirin. - amLODipine (NORVASC) 10 MG tablet; Take 1 tablet (10 mg total) by mouth daily.  Dispense: 90 tablet; Refill: 1 - clopidogrel (PLAVIX) 75 MG tablet; Take 1 tablet (75 mg total) by mouth daily. TAKE 1 TABLET(75 MG) BY MOUTH DAILY  Dispense: 90 tablet; Refill: 1 - lisinopril (ZESTRIL) 5 MG tablet; Take 1 tablet (5 mg total) by mouth daily.  Dispense: 90 tablet; Refill: 1  3. PAD (peripheral artery disease) ([place]) As noted above is followed by cardiology and is currently on Plavix and Pletal - amLODipine (NORVASC) 10 MG tablet; Take 1 tablet (10 mg total) by mouth daily.  Dispense: 90 tablet; Refill: 1 - lisinopril (ZESTRIL) 5 MG tablet; Take 1 tablet (5 mg total) by mouth daily.  Dispense: 90 tablet; Refill: 1  4. Hyperlipidemia, mixed Chronic.  Controlled.  Stable.  Continue atorvastatin 40 mg once a day. - atorvastatin (LIPITOR) 40 MG tablet; Take 1 tablet (40 mg total) by mouth daily.  Dispense: 90 tablet; Refill: 1  5. Atherosclerotic peripheral vascular disease with intermittent claudication (HCC) As noted above - clopidogrel (PLAVIX) 75 MG tablet; Take 1 tablet (75 mg total) by mouth daily. TAKE 1 TABLET(75 MG) BY MOUTH DAILY  Dispense: 90 tablet; Refill: 1   Influenza vaccination discussed and administered.

## 2020-11-25 DIAGNOSIS — Z23 Encounter for immunization: Secondary | ICD-10-CM | POA: Diagnosis not present

## 2020-11-27 IMAGING — US US RENAL
1 series · 14 of 25 positions shown · non-contrast
Comparison: 03/06/2018, 06/27/2017

CLINICAL DATA: Hematuria

EXAM:
RENAL / URINARY TRACT ULTRASOUND COMPLETE

[Series 1: us renal · 0.23mm/px · 56 acquisitions, 14 frames shown]
[im 1/56]
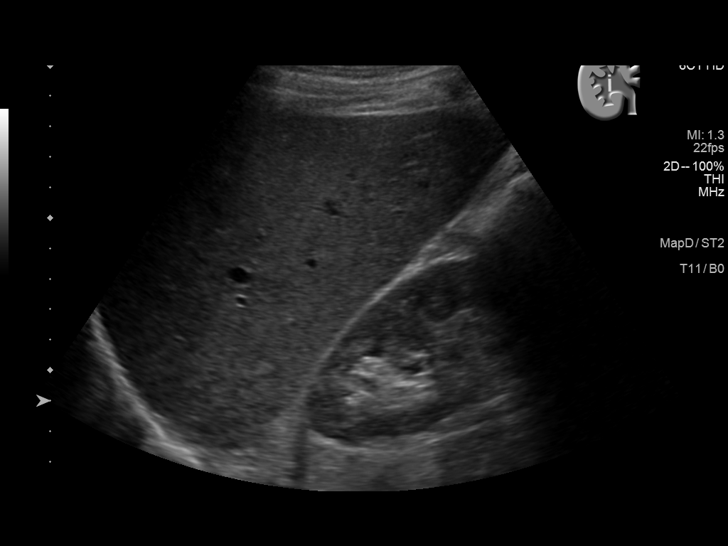
[im 5/56]
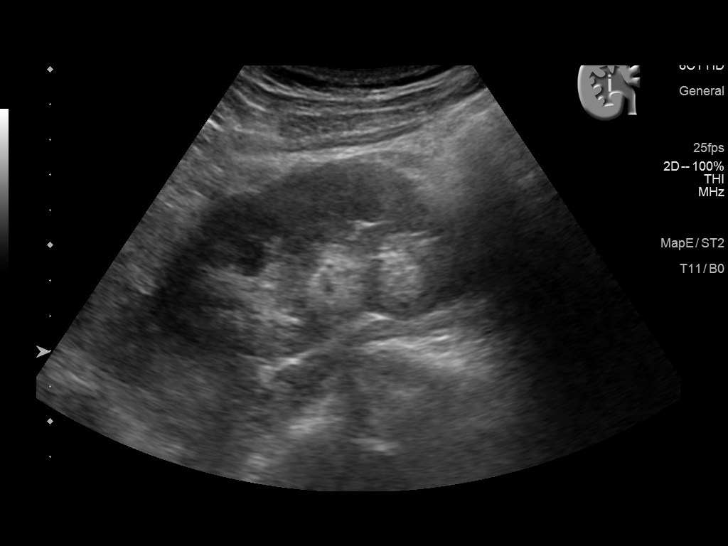
[im 10/56]
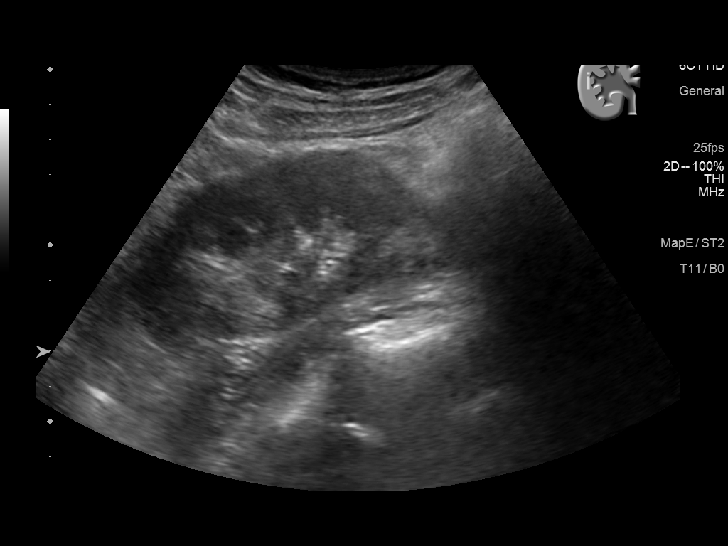
[im 14/56]
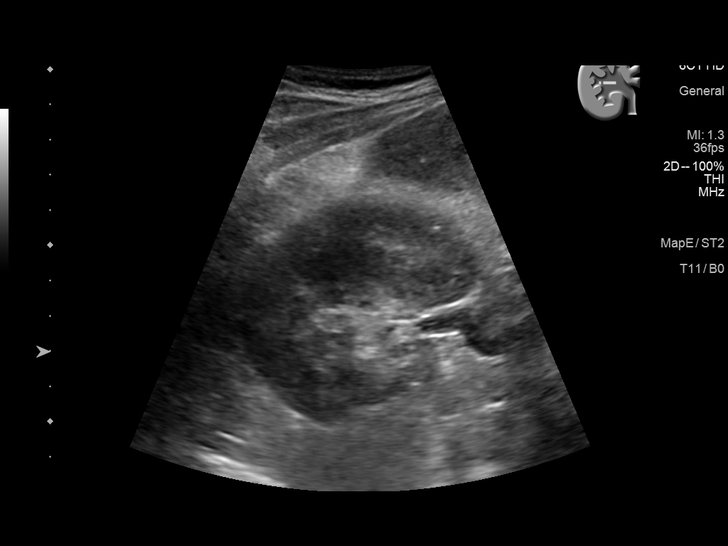
[im 19/56]
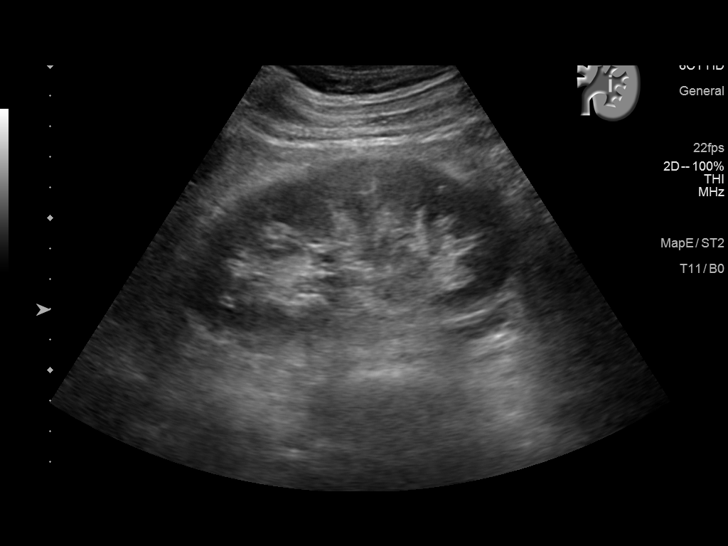
[im 21/56]
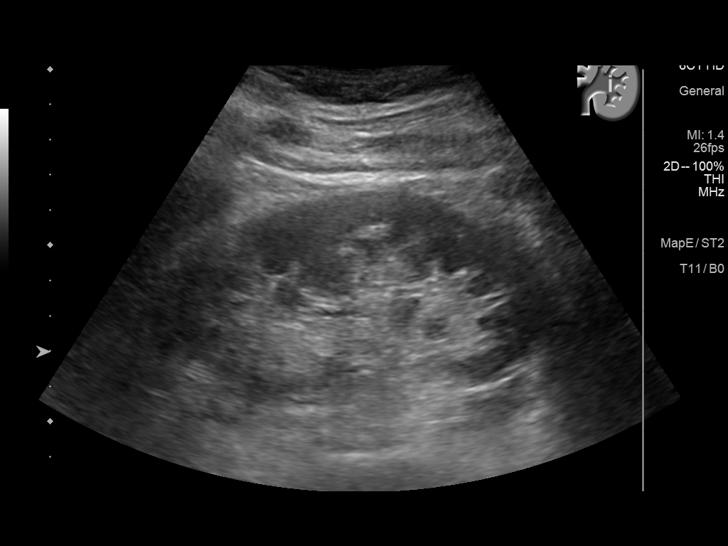
[im 26/56]
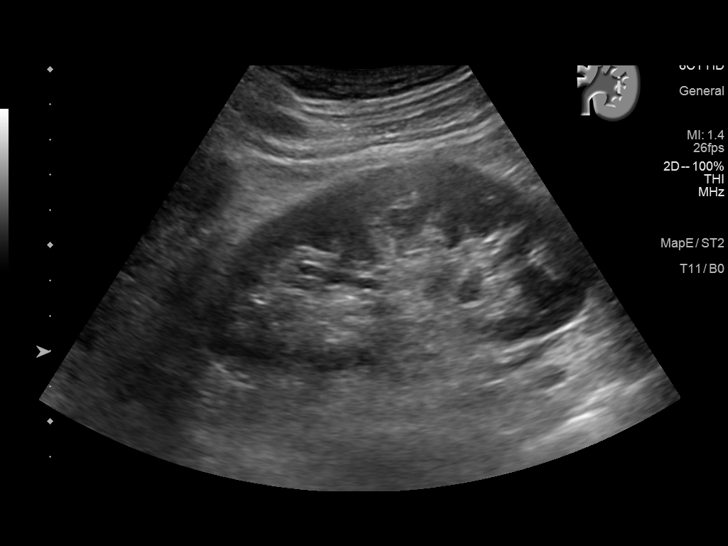
[im 30/56]
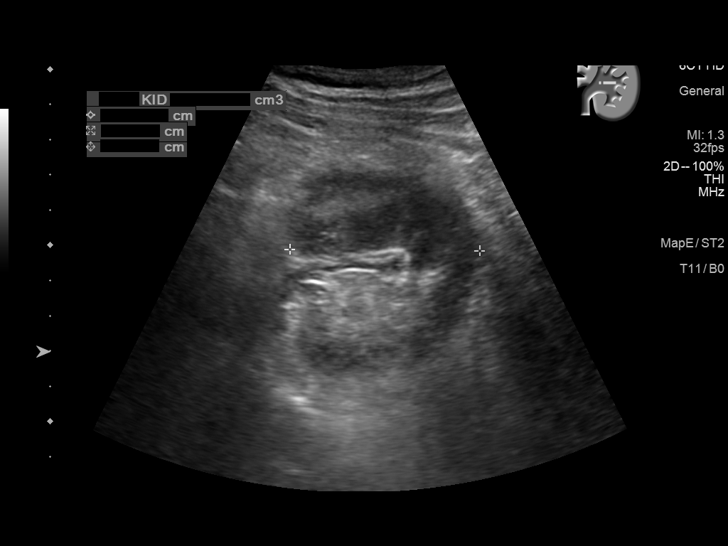
[im 35/56]
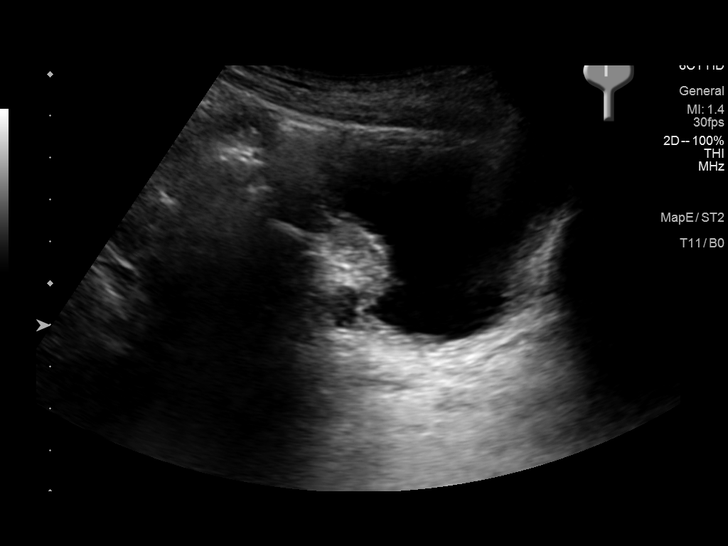
[im 37/56]
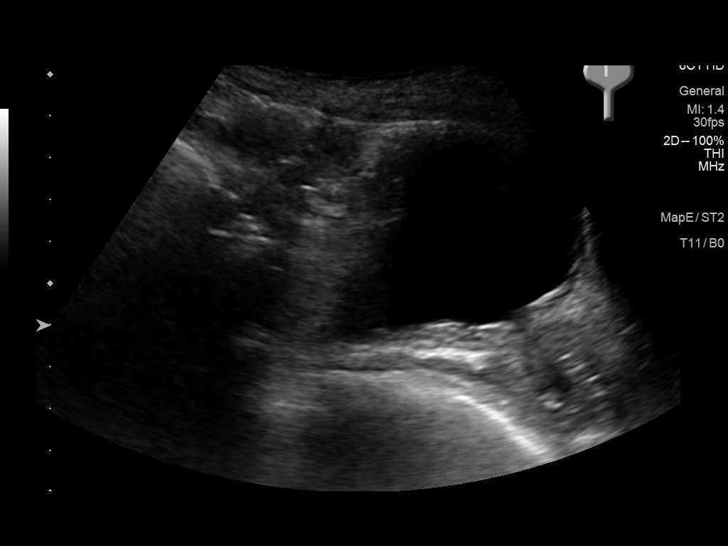
[im 42/56]
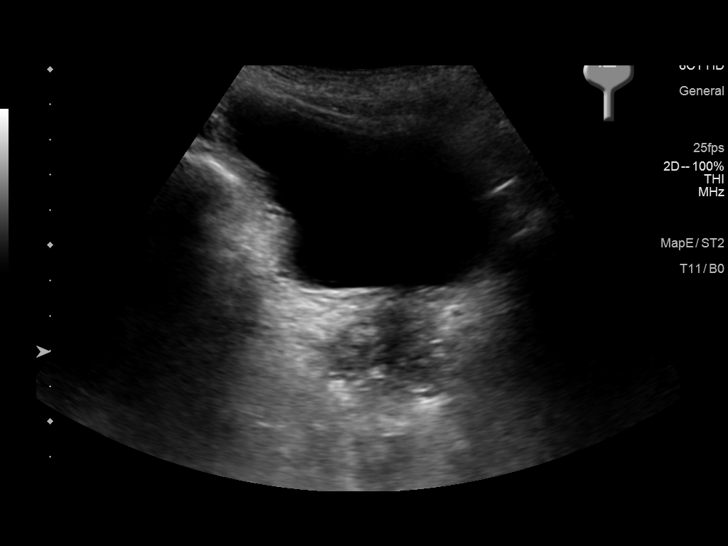
[im 46/56]
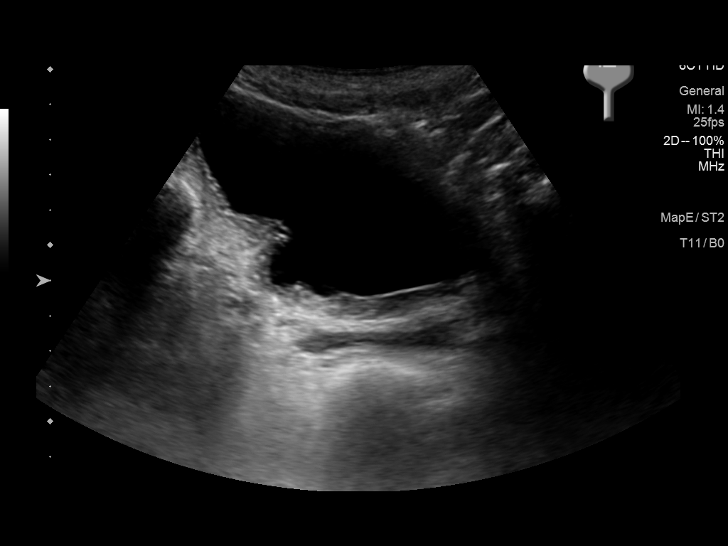
[im 51/56]
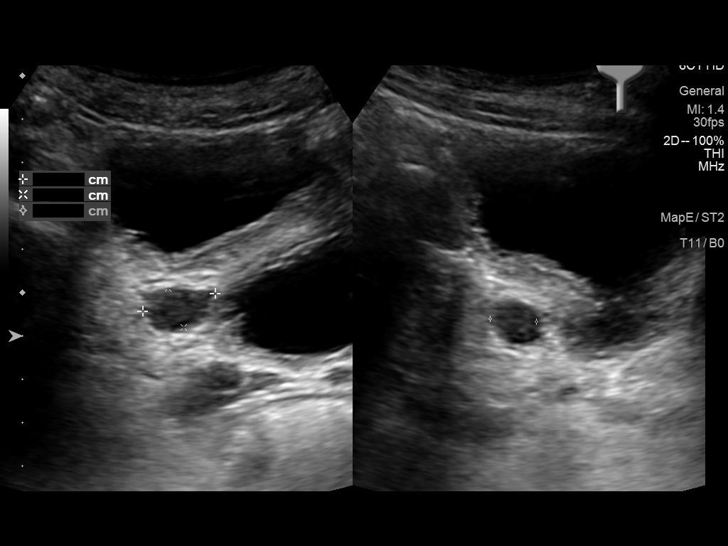
[im 56/56]
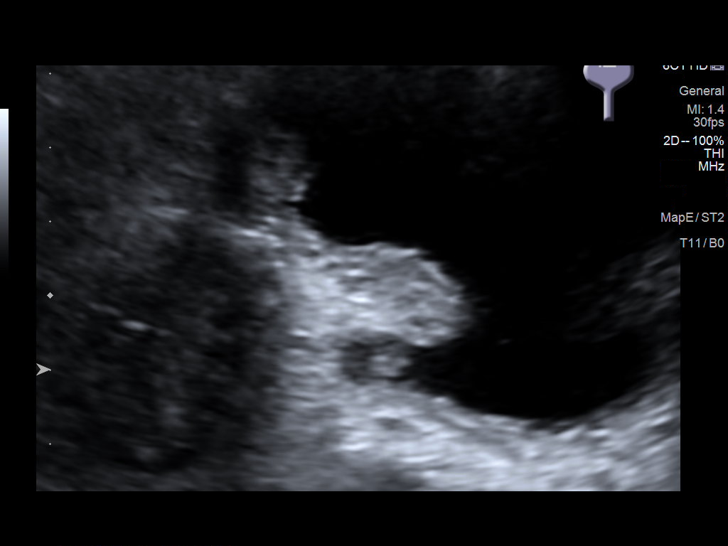

[14 of 25 positions shown; findings below may reference images not displayed]

FINDINGS: Right Kidney:

Renal measurements: 11.1 x 4.7 x 5.9 cm. = volume: 161 mL .
Echogenicity within normal limits. No mass or hydronephrosis
visualized.

Left Kidney:

Renal measurements: 11.2 x 4.7 x 5.4 cm. = volume: 150 mL.
Echogenicity within normal limits. No mass or hydronephrosis
visualized.

Bladder:

Appears normal for degree of bladder distention.

Other:

Small hypoechoic lesion is noted measuring 1.7 cm adjacent to the
bladder on the right. This could represent a mildly complicated
diverticulum. This corresponds to finding seen on previous CT from
0033.
IMPRESSION: Normal kidneys bilaterally.

Small right bladder diverticulum.

## 2021-01-04 NOTE — Progress Notes (Addendum)
   01/05/2021 CC:  Chief Complaint  Patient presents with   Cysto     HPI: Dylan Villanueva is a 80 y.o. 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   Initially presented with gross hematuria.  Status post radiation for prostate cancer and had a bulbar urethral stricture, unable to accommodate cystoscopy in the office.   He was taken to the operating room on 09/23/2019 for urethral dilation as well as TURBT of a tumor noted at the time, proximally 1 cm at the left bladder neck.  Surgical pathology was consistent with low-grade noninvasive tumor.  No recurrence to date.    Doing well, no urinary complaints.     Vitals:   01/05/21 1008  BP: 125/71  Pulse: 73  NED. A&Ox3.   No respiratory distress   Abd soft, NT, ND Normal phallus with bilateral descended testicles  Cystoscopy Procedure Note  Patient identification was confirmed, informed consent was obtained, and patient was prepped using Betadine solution.  Lidocaine jelly was administered per urethral meatus.     Pre-Procedure: - Inspection reveals a normal caliber ureteral meatus.  Procedure: The flexible cystoscope was introduced without difficulty - At junction of bulbar urethra there was a narrow area scope was advanced over a superstiff  wire  - Normal prostate  - Normal bladder neck, previous TUR site with stellate scar left bladder neck. - Bilateral ureteral orifices identified - Bladder mucosa  reveals no ulcers, tumors, or lesions - No bladder stones - No trabeculation  Retroflexion shows unremarkable    Post-Procedure: - Patient tolerated the procedure well  Assessment/ Plan:  1. History of bladder cancer No evidence of disease  Per NCCN guidelines, will plan for surveillance cystoscopy in about 6 months x 2 years then less frequently thereafter - Urinalysis, Complete  2. Postprocedural bulbous urethral stricture - Cystoscopy over a wire  today, ultimately able to advance 84 French scope in over a wire  - He was given Keflex today   Return in 6 months for cystoscopy   I,Kailey Littlejohn,acting as a scribe for Hollice Espy, MD.,have documented all relevant documentation on the behalf of Hollice Espy, MD,as directed by  Hollice Espy, MD while in the presence of Hollice Espy, MD.  I have reviewed the above documentation for accuracy and completeness, and I agree with the above.   Hollice Espy, MD

## 2021-01-05 ENCOUNTER — Other Ambulatory Visit: Payer: Self-pay

## 2021-01-05 ENCOUNTER — Ambulatory Visit (INDEPENDENT_AMBULATORY_CARE_PROVIDER_SITE_OTHER): Payer: Medicare Other | Admitting: Urology

## 2021-01-05 VITALS — BP 125/71 | HR 73 | Ht 67.0 in | Wt 130.0 lb

## 2021-01-05 DIAGNOSIS — Z8551 Personal history of malignant neoplasm of bladder: Secondary | ICD-10-CM | POA: Diagnosis not present

## 2021-01-05 LAB — URINALYSIS, COMPLETE
Bilirubin, UA: NEGATIVE
Glucose, UA: NEGATIVE
Ketones, UA: NEGATIVE
Leukocytes,UA: NEGATIVE
Nitrite, UA: NEGATIVE
Protein,UA: NEGATIVE
RBC, UA: NEGATIVE
Specific Gravity, UA: 1.025 (ref 1.005–1.030)
Urobilinogen, Ur: 0.2 mg/dL (ref 0.2–1.0)
pH, UA: 5.5 (ref 5.0–7.5)

## 2021-01-05 LAB — MICROSCOPIC EXAMINATION
Bacteria, UA: NONE SEEN
Epithelial Cells (non renal): NONE SEEN /hpf (ref 0–10)

## 2021-01-05 MED ORDER — CEPHALEXIN 250 MG PO CAPS
500.0000 mg | ORAL_CAPSULE | Freq: Once | ORAL | Status: AC
  Administered 2021-01-05: 500 mg via ORAL

## 2021-01-11 ENCOUNTER — Other Ambulatory Visit: Payer: Self-pay

## 2021-01-11 DIAGNOSIS — I70219 Atherosclerosis of native arteries of extremities with intermittent claudication, unspecified extremity: Secondary | ICD-10-CM

## 2021-01-11 DIAGNOSIS — I2510 Atherosclerotic heart disease of native coronary artery without angina pectoris: Secondary | ICD-10-CM

## 2021-01-11 DIAGNOSIS — E782 Mixed hyperlipidemia: Secondary | ICD-10-CM

## 2021-01-11 MED ORDER — CLOPIDOGREL BISULFATE 75 MG PO TABS: 75.0000 mg | ORAL_TABLET | Freq: Every day | ORAL | 0 refills | Status: DC

## 2021-01-11 MED ORDER — ATORVASTATIN CALCIUM 40 MG PO TABS: 40.0000 mg | ORAL_TABLET | Freq: Every day | ORAL | 0 refills | Status: DC

## 2021-02-03 ENCOUNTER — Other Ambulatory Visit: Payer: Self-pay

## 2021-02-03 DIAGNOSIS — I70219 Atherosclerosis of native arteries of extremities with intermittent claudication, unspecified extremity: Secondary | ICD-10-CM

## 2021-02-03 MED ORDER — CILOSTAZOL 100 MG PO TABS: ORAL_TABLET | ORAL | 0 refills | Status: DC

## 2021-03-23 DIAGNOSIS — F17200 Nicotine dependence, unspecified, uncomplicated: Secondary | ICD-10-CM | POA: Diagnosis not present

## 2021-03-23 DIAGNOSIS — I6523 Occlusion and stenosis of bilateral carotid arteries: Secondary | ICD-10-CM | POA: Diagnosis not present

## 2021-03-23 DIAGNOSIS — I2510 Atherosclerotic heart disease of native coronary artery without angina pectoris: Secondary | ICD-10-CM | POA: Diagnosis not present

## 2021-03-23 DIAGNOSIS — I10 Essential (primary) hypertension: Secondary | ICD-10-CM | POA: Diagnosis not present

## 2021-03-23 DIAGNOSIS — E782 Mixed hyperlipidemia: Secondary | ICD-10-CM | POA: Diagnosis not present

## 2021-03-23 DIAGNOSIS — I70219 Atherosclerosis of native arteries of extremities with intermittent claudication, unspecified extremity: Secondary | ICD-10-CM | POA: Diagnosis not present

## 2021-05-03 ENCOUNTER — Other Ambulatory Visit: Payer: Self-pay | Admitting: Family Medicine

## 2021-05-03 DIAGNOSIS — I70219 Atherosclerosis of native arteries of extremities with intermittent claudication, unspecified extremity: Secondary | ICD-10-CM

## 2021-05-04 ENCOUNTER — Other Ambulatory Visit: Payer: Self-pay | Admitting: Family Medicine

## 2021-05-04 DIAGNOSIS — I70219 Atherosclerosis of native arteries of extremities with intermittent claudication, unspecified extremity: Secondary | ICD-10-CM

## 2021-05-04 NOTE — Telephone Encounter (Signed)
Copied from [place] [phone] [id number]. Topic: Quick Communication - Rx Refill/Question ?>> May 04, 2021  9:37 AM Yvette Rack wrote: ?Medication: cilostazol (PLETAL) 100 MG tablet ? ?Has the patient contacted their pharmacy? Yes.  Pt stated pharmacy advised Rx not received or it was denied ?(Agent: If no, request that the patient contact the pharmacy for the refill. If patient does not wish to contact the pharmacy document the reason why and proceed with request.) ?(Agent: If yes, when and what did the pharmacy advise?) ? ?Preferred Pharmacy (with phone number or street name): [id number] - [place], [place] [address] AT [place]  ?Phone: [phone] ?Fax: [phone] ? ?Has the patient been seen for an appointment in the last year OR does the patient have an upcoming appointment? Yes.   ? ?Agent: Please be advised that RX refills may take up to 3 business days. We ask that you follow-up with your pharmacy. ?

## 2021-05-04 NOTE — Telephone Encounter (Signed)
[place] called and spoke to [place], [facility] about the refill(s) Cilostazol requested. Advised it was sent on 05/03/21 #180/0 refill(s). She says it's ready for pickup. Patient called, wife answered, on DPR, made aware.  ? ?

## 2021-05-04 NOTE — Telephone Encounter (Signed)
[place] called, extended hold time, will call again later. Medication shows received by pharmacy on 05/03/21. ?

## 2021-05-25 ENCOUNTER — Ambulatory Visit (INDEPENDENT_AMBULATORY_CARE_PROVIDER_SITE_OTHER): Payer: Medicare Other | Admitting: Family Medicine

## 2021-05-25 ENCOUNTER — Encounter: Payer: Self-pay | Admitting: Family Medicine

## 2021-05-25 VITALS — BP 130/58 | HR 80 | Ht 67.0 in | Wt 136.0 lb

## 2021-05-25 DIAGNOSIS — R710 Precipitous drop in hematocrit: Secondary | ICD-10-CM

## 2021-05-25 DIAGNOSIS — E782 Mixed hyperlipidemia: Secondary | ICD-10-CM | POA: Diagnosis not present

## 2021-05-25 DIAGNOSIS — R69 Illness, unspecified: Secondary | ICD-10-CM | POA: Diagnosis not present

## 2021-05-25 DIAGNOSIS — I70219 Atherosclerosis of native arteries of extremities with intermittent claudication, unspecified extremity: Secondary | ICD-10-CM | POA: Diagnosis not present

## 2021-05-25 DIAGNOSIS — I2510 Atherosclerotic heart disease of native coronary artery without angina pectoris: Secondary | ICD-10-CM

## 2021-05-25 DIAGNOSIS — I10 Essential (primary) hypertension: Secondary | ICD-10-CM

## 2021-05-25 DIAGNOSIS — I739 Peripheral vascular disease, unspecified: Secondary | ICD-10-CM | POA: Diagnosis not present

## 2021-05-25 MED ORDER — CLOPIDOGREL BISULFATE 75 MG PO TABS: 75.0000 mg | ORAL_TABLET | Freq: Every day | ORAL | 1 refills | Status: DC

## 2021-05-25 MED ORDER — CILOSTAZOL 100 MG PO TABS: ORAL_TABLET | ORAL | 1 refills | Status: DC

## 2021-05-25 MED ORDER — AMLODIPINE BESYLATE 10 MG PO TABS: 10.0000 mg | ORAL_TABLET | Freq: Every day | ORAL | 1 refills | Status: DC

## 2021-05-25 MED ORDER — LISINOPRIL 5 MG PO TABS: 5.0000 mg | ORAL_TABLET | Freq: Every day | ORAL | 1 refills | Status: DC

## 2021-05-25 MED ORDER — ATORVASTATIN CALCIUM 40 MG PO TABS: 40.0000 mg | ORAL_TABLET | Freq: Every day | ORAL | 1 refills | Status: DC

## 2021-05-25 NOTE — Progress Notes (Signed)
? ? ?Date:  05/25/2021  ? ?Name:  Dylan Villanueva   DOB:  1940/02/19   MRN:  [id number] ? ? ?Chief Complaint: Coronary Artery Disease (Sees cardio in November), Hypertension, and Hyperlipidemia ? ?Coronary Artery Disease ?Presents for follow-up visit. Symptoms include shortness of breath. Pertinent negatives include no chest pain, chest pressure, chest tightness, dizziness, leg swelling, muscle weakness, palpitations or weight gain. Risk factors include hyperlipidemia and hypertension. Risk factors do not include obesity. The symptoms have been stable. Compliance with diet is good. Compliance with exercise is variable. Compliance with medications is good.  ?Hypertension ?This is a chronic problem. The current episode started more than 1 year ago. The problem has been gradually improving since onset. The problem is controlled. Associated symptoms include shortness of breath. Pertinent negatives include no anxiety, blurred vision, chest pain, headaches, malaise/fatigue, neck pain, orthopnea, palpitations, peripheral edema, PND or sweats. Risk factors for coronary artery disease include dyslipidemia. Past treatments include ACE inhibitors and calcium channel blockers. The current treatment provides moderate improvement. There are no compliance problems.  There is no history of angina, kidney disease, CAD/MI, CVA, heart failure, left ventricular hypertrophy, PVD or retinopathy. There is no history of chronic renal disease, a hypertension causing med or renovascular disease.  ?Hyperlipidemia ?This is a chronic problem. The current episode started more than 1 year ago. The problem is controlled. Recent lipid tests were reviewed and are normal. He has no history of chronic renal disease, diabetes, hypothyroidism, liver disease, obesity or nephrotic syndrome. Associated symptoms include shortness of breath. Pertinent negatives include no chest pain or myalgias. Current antihyperlipidemic treatment includes statins. The current  treatment provides moderate improvement of lipids. There are no compliance problems.  Risk factors for coronary artery disease include dyslipidemia and hypertension.  ? ?Lab Results  ?Component Value Date  ? NA 143 05/18/2020  ? K 4.5 05/18/2020  ? CO2 25 05/18/2020  ? GLUCOSE 92 05/18/2020  ? BUN 14 05/18/2020  ? CREATININE 0.83 05/18/2020  ? CALCIUM 9.1 05/18/2020  ? EGFR 88 05/18/2020  ? GFRNONAA >60 09/16/2019  ? ?Lab Results  ?Component Value Date  ? CHOL 116 05/18/2020  ? HDL 61 05/18/2020  ? LDLCALC 44 05/18/2020  ? TRIG 42 05/18/2020  ? CHOLHDL 3.6 05/08/2017  ? ?Lab Results  ?Component Value Date  ? TSH 1.050 05/08/2017  ? ?No results found for: HGBA1C ?Lab Results  ?Component Value Date  ? WBC 8.6 07/15/2020  ? HGB 12.9 (L) 07/15/2020  ? HCT 37.7 07/15/2020  ? MCV 100 (H) 07/15/2020  ? PLT 219 07/15/2020  ? ?Lab Results  ?Component Value Date  ? ALT 16 05/20/2019  ? AST 20 05/20/2019  ? ALKPHOS 74 05/20/2019  ? BILITOT 0.3 05/20/2019  ? ?Lab Results  ?Component Value Date  ? VD25OH 36.5 05/08/2017  ?  ? ?Review of Systems  ?Constitutional:  Negative for chills, fever, malaise/fatigue and weight gain.  ?HENT:  Negative for drooling, ear discharge, ear pain and sore throat.   ?Eyes:  Negative for blurred vision.  ?Respiratory:  Positive for shortness of breath. Negative for cough, chest tightness and wheezing.   ?Cardiovascular:  Negative for chest pain, palpitations, orthopnea, leg swelling and PND.  ?Gastrointestinal:  Negative for abdominal pain, blood in stool, constipation, diarrhea and nausea.  ?Endocrine: Negative for polydipsia.  ?Genitourinary:  Negative for dysuria, frequency, hematuria and urgency.  ?Musculoskeletal:  Negative for back pain, myalgias, muscle weakness and neck pain.  ?Skin:  Negative for rash.  ?  Allergic/Immunologic: Negative for environmental allergies.  ?Neurological:  Negative for dizziness and headaches.  ?Hematological:  Does not bruise/bleed easily.  ?Psychiatric/Behavioral:   Negative for suicidal ideas. The patient is not nervous/anxious.   ? ?Patient Active Problem List  ? Diagnosis Date Noted  ? Current smoker 04/08/2020  ? Atherosclerosis of native arteries of extremity with intermittent claudication ([place]) 06/25/2018  ? Current every day smoker 06/25/2018  ? Benign gastrointestinal stromal tumor (GIST)   ? History of colonic polyps   ? Benign neoplasm of transverse colon   ? Benign neoplasm of ascending colon   ? Gastric mass   ? Bilateral carotid artery stenosis 08/30/2017  ? Benign essential HTN 05/25/2017  ? Non-ST elevation myocardial infarction (NSTEMI), subendocardial infarction, subsequent episode of care [facility]) 05/25/2017  ? Chronic venous insufficiency 05/08/2017  ? Coronary artery disease involving native coronary artery of native heart 05/08/2017  ? Hyperlipidemia, mixed 05/08/2017  ? Hypertension 05/08/2017  ? History of prostate cancer 05/08/2017  ? Gait instability 05/08/2017  ? Vitamin D deficiency 05/08/2017  ? Presence of stent in coronary artery in patient with coronary artery disease 05/08/2017  ? History of shingles 05/08/2017  ? ? ?Allergies  ?Allergen Reactions  ? Penicillins Nausea Only  ?  Has patient had a PCN reaction causing immediate rash, facial/tongue/throat swelling, SOB or lightheadedness with hypotension: no ?Has patient had a PCN reaction causing severe rash involving mucus membranes or skin necrosis: no ?Has patient had a PCN reaction that required hospitalization: no ?Has patient had a PCN reaction occurring within the last 10 years: no ?If all of the above answers are "NO", then may proceed with Cephalosporin use. ?  ? Sulfa Antibiotics Diarrhea  ? ? ?Past Surgical History:  ?Procedure Laterality Date  ? APPENDECTOMY  1980  ? CARDIAC SURGERY    ? COLONOSCOPY WITH PROPOFOL N/A 03/02/2018  ? Procedure: COLONOSCOPY WITH PROPOFOL;  Surgeon: [name], MD;  Location: [place];  Service: Endoscopy;  Laterality: N/A;  ? CORONARY ANGIOPLASTY  WITH STENT PLACEMENT    ? CYSTOSCOPY W/ RETROGRADES Bilateral 09/23/2019  ? Procedure: CYSTOSCOPY WITH RETROGRADE PYELOGRAM;  Surgeon: Hollice Espy, MD;  Location: [facility];  Service: Urology;  Laterality: Bilateral;  ? CYSTOSCOPY WITH URETHRAL DILATATION N/A 09/23/2019  ? Procedure: CYSTOSCOPY WITH URETHRAL DILATATION;  Surgeon: Hollice Espy, MD;  Location: [facility];  Service: Urology;  Laterality: N/A;  ? ESOPHAGOGASTRODUODENOSCOPY N/A 11/07/2017  ? Procedure: ESOPHAGOGASTRODUODENOSCOPY (EGD);  Surgeon: Jules Husbands, MD;  Location: [facility];  Service: General;  Laterality: N/A;  ? ESOPHAGOGASTRODUODENOSCOPY (EGD) WITH PROPOFOL  03/02/2018  ? Procedure: ESOPHAGOGASTRODUODENOSCOPY (EGD) WITH PROPOFOL;  Surgeon: [name], MD;  Location: [place];  Service: Endoscopy;;  ? EUS N/A 07/13/2017  ? Procedure: FULL UPPER ENDOSCOPIC ULTRASOUND (EUS) RADIAL;  Surgeon: Jola Schmidt, MD;  Location: [facility];  Service: Endoscopy;  Laterality: N/A;  ? HIGH DOSE RADIATION IMPLANT INSERTION  2006  ? LAPAROSCOPIC REMOVAL ABDOMINAL MASS N/A 11/07/2017  ? Procedure: LAPAROSCOPIC GASTRIC MASS RESECTION;  Surgeon: Jules Husbands, MD;  Location: [facility];  Service: General;  Laterality: N/A;  ? LOWER EXTREMITY ANGIOGRAPHY Left 07/04/2018  ? Procedure: LOWER EXTREMITY ANGIOGRAPHY;  Surgeon: Katha Cabal, MD;  Location: [place] [facility];  Service: Cardiovascular;  Laterality: Left;  ? LOWER EXTREMITY ANGIOGRAPHY Left 08/10/2018  ? Procedure: LOWER EXTREMITY ANGIOGRAPHY;  Surgeon: Katha Cabal, MD;  Location: [place] [facility];  Service: Cardiovascular;  Laterality: Left;  ? LOWER EXTREMITY  ANGIOGRAPHY Left 09/12/2018  ? Procedure: LOWER EXTREMITY ANGIOGRAPHY;  Surgeon: Katha Cabal, MD;  Location: [place] [facility];  Service: Cardiovascular;  Laterality: Left;  ? POLYPECTOMY  03/02/2018  ? Procedure: POLYPECTOMY;  Surgeon: [name], MD;  Location: [place];  Service: Endoscopy;;   ? TRANSURETHRAL RESECTION OF BLADDER TUMOR N/A 09/23/2019  ? Procedure: TRANSURETHRAL RESECTION OF BLADDER TUMOR (TURBT);  Surgeon: Hollice Espy, MD;  Location: [facility];  Service: Urology;  Josph Macho

## 2021-05-26 LAB — CBC WITH DIFFERENTIAL/PLATELET
Basophils Absolute: 0.1 10*3/uL (ref 0.0–0.2)
Basos: 1 %
EOS (ABSOLUTE): 0.3 10*3/uL (ref 0.0–0.4)
Eos: 4 %
Hematocrit: 40.7 % (ref 37.5–51.0)
Hemoglobin: 13.8 g/dL (ref 13.0–17.7)
Immature Grans (Abs): 0 10*3/uL (ref 0.0–0.1)
Immature Granulocytes: 0 %
Lymphocytes Absolute: 1.6 10*3/uL (ref 0.7–3.1)
Lymphs: 22 %
MCH: 34.2 pg — ABNORMAL HIGH (ref 26.6–33.0)
MCHC: 33.9 g/dL (ref 31.5–35.7)
MCV: 101 fL — ABNORMAL HIGH (ref 79–97)
Monocytes Absolute: 0.6 10*3/uL (ref 0.1–0.9)
Monocytes: 8 %
Neutrophils Absolute: 4.9 10*3/uL (ref 1.4–7.0)
Neutrophils: 65 %
Platelets: 238 10*3/uL (ref 150–450)
RBC: 4.03 x10E6/uL — ABNORMAL LOW (ref 4.14–5.80)
RDW: 12.4 % (ref 11.6–15.4)
WBC: 7.4 10*3/uL (ref 3.4–10.8)

## 2021-05-26 LAB — LIPID PANEL WITH LDL/HDL RATIO
Cholesterol, Total: 139 mg/dL (ref 100–199)
HDL: 77 mg/dL (ref >39)
LDL Chol Calc (NIH): 51 mg/dL (ref 0–99)
LDL/HDL Ratio: 0.7 ratio (ref 0.0–3.6)
Triglycerides: 51 mg/dL (ref 0–149)
VLDL Cholesterol Cal: 11 mg/dL (ref 5–40)

## 2021-05-26 LAB — COMPREHENSIVE METABOLIC PANEL
ALT: 29 IU/L (ref 0–44)
AST: 31 IU/L (ref 0–40)
Albumin/Globulin Ratio: 2.2 (ref 1.2–2.2)
Albumin: 4.6 g/dL (ref 3.6–4.6)
Alkaline Phosphatase: 86 IU/L (ref 44–121)
BUN/Creatinine Ratio: 10 (ref 10–24)
BUN: 9 mg/dL (ref 8–27)
Bilirubin Total: 0.3 mg/dL (ref 0.0–1.2)
CO2: 26 mmol/L (ref 20–29)
Calcium: 9.4 mg/dL (ref 8.6–10.2)
Chloride: 103 mmol/L (ref 96–106)
Creatinine, Ser: 0.89 mg/dL (ref 0.76–1.27)
Globulin, Total: 2.1 g/dL (ref 1.5–4.5)
Glucose: 102 mg/dL — ABNORMAL HIGH (ref 70–99)
Potassium: 4.2 mmol/L (ref 3.5–5.2)
Sodium: 142 mmol/L (ref 134–144)
Total Protein: 6.7 g/dL (ref 6.0–8.5)
eGFR: 86 mL/min/{1.73_m2} (ref >59)

## 2021-06-27 NOTE — Progress Notes (Signed)
MRN : [id number]  Dylan Villanueva is a 81 y.o. (Jul 07, 1940) male who presents with chief complaint of check circulation.  History of Present Illness:   The patient returns to the office for followup and review status post angiogram with intervention on 09/12/2018.    Procedure(s) Performed:             1.   Introduction catheter into left lower extremity 3rd order catheter placement from right femoral approach             2.   Introduction catheter into left lower extremity third order catheter placement from left ankle approach             3.   Ultrasound-guided access to the right common femoral artery and left posterior tibial artery artery             4.   Crosser atherectomy with percutaneous transluminal angioplasty and stent placement left superficial femoral and popliteal arteries             4.  Percutaneous transluminal angioplasty left proximal posterior tibial and tibial peroneal trunk             5.  Percutaneous transluminal angioplasty left external iliac artery secondary to in-stent restenosis             6.  Star close closure right common femoral arteriotomy and manual pressure held left posterior tibial at the ankle   The patient notes improvement in the lower extremity symptoms. No interval shortening of the patient's claudication distance or rest pain symptoms. No new ulcers or wounds have occurred since the last visit.   There have been no significant changes to the patient's overall health care.   The patient denies amaurosis fugax or recent TIA symptoms. There are no recent neurological changes noted. The patient denies history of DVT, PE or superficial thrombophlebitis. The patient denies recent episodes of angina or shortness of breath.    ABI's Rt=1.13 and Lt=1.15  (previous ABI's Rt=1.08 and Lt=1.22)   Previous duplex ultrasound of the left SFA mild to moderate stenosis of the proximal SFA, the SFA stent is widely patent    No outpatient medications have  been marked as taking for the 06/28/21 encounter (Appointment) with Delana Meyer, Dolores Lory, MD.    Past Medical History:  Diagnosis Date   Cancer [facility]) 2006   prostate    Colon polyps    Coronary artery disease    Dyspnea    With exertion climbing up hill   Hyperlipidemia    Hypertension    Myocardial infarction [facility])    Prostate CA ([place])    Smoker    Squamous acanthoma of external ear, right     Past Surgical History:  Procedure Laterality Date   APPENDECTOMY  1980   CARDIAC SURGERY     COLONOSCOPY WITH PROPOFOL N/A 03/02/2018   Procedure: COLONOSCOPY WITH PROPOFOL;  Surgeon: [name], MD;  Location: [place];  Service: Endoscopy;  Laterality: N/A;   CORONARY ANGIOPLASTY WITH STENT PLACEMENT     CYSTOSCOPY W/ RETROGRADES Bilateral 09/23/2019   Procedure: CYSTOSCOPY WITH RETROGRADE PYELOGRAM;  Surgeon: Hollice Espy, MD;  Location: [facility];  Service: Urology;  Laterality: Bilateral;   CYSTOSCOPY WITH URETHRAL DILATATION N/A 09/23/2019   Procedure: CYSTOSCOPY WITH URETHRAL DILATATION;  Surgeon: Hollice Espy, MD;  Location: [facility];  Service: Urology;  Laterality: N/A;   ESOPHAGOGASTRODUODENOSCOPY N/A 11/07/2017   Procedure: ESOPHAGOGASTRODUODENOSCOPY (EGD);  Surgeon: Dahlia Byes,  Marjory Lies, MD;  Location: [facility];  Service: General;  Laterality: N/A;   ESOPHAGOGASTRODUODENOSCOPY (EGD) WITH PROPOFOL  03/02/2018   Procedure: ESOPHAGOGASTRODUODENOSCOPY (EGD) WITH PROPOFOL;  Surgeon: [name], MD;  Location: [place];  Service: Endoscopy;;   EUS N/A 07/13/2017   Procedure: FULL UPPER ENDOSCOPIC ULTRASOUND (EUS) RADIAL;  Surgeon: Jola Schmidt, MD;  Location: [facility];  Service: Endoscopy;  Laterality: N/A;   HIGH DOSE RADIATION IMPLANT INSERTION  2006   LAPAROSCOPIC REMOVAL ABDOMINAL MASS N/A 11/07/2017   Procedure: LAPAROSCOPIC GASTRIC MASS RESECTION;  Surgeon: Jules Husbands, MD;  Location: [facility];  Service: General;  Laterality: N/A;   LOWER EXTREMITY  ANGIOGRAPHY Left 07/04/2018   Procedure: LOWER EXTREMITY ANGIOGRAPHY;  Surgeon: Katha Cabal, MD;  Location: [place] [facility];  Service: Cardiovascular;  Laterality: Left;   LOWER EXTREMITY ANGIOGRAPHY Left 08/10/2018   Procedure: LOWER EXTREMITY ANGIOGRAPHY;  Surgeon: Katha Cabal, MD;  Location: [place] [facility];  Service: Cardiovascular;  Laterality: Left;   LOWER EXTREMITY ANGIOGRAPHY Left 09/12/2018   Procedure: LOWER EXTREMITY ANGIOGRAPHY;  Surgeon: Katha Cabal, MD;  Location: [place] [facility];  Service: Cardiovascular;  Laterality: Left;   POLYPECTOMY  03/02/2018   Procedure: POLYPECTOMY;  Surgeon: [name], MD;  Location: [place];  Service: Endoscopy;;   TRANSURETHRAL RESECTION OF BLADDER TUMOR N/A 09/23/2019   Procedure: TRANSURETHRAL RESECTION OF BLADDER TUMOR (TURBT);  Surgeon: Hollice Espy, MD;  Location: [facility];  Service: Urology;  Laterality: N/A;    Social History Social History   Tobacco Use   Smoking status: Every Day    Packs/day: 1.00    Years: 66.00    Pack years: 66.00    Types: Cigarettes    Start date: 11/18/1952   Smokeless tobacco: Never   Tobacco comments:         Vaping Use   Vaping Use: Never used  Substance Use Topics   Alcohol use: Yes    Alcohol/week: 3.0 standard drinks    Types: 3 Cans of beer per week   Drug use: Never    Family History Family History  Problem Relation Age of Onset   Cancer Mother        pancreatic   Dementia Father    Cancer Sister        breast    Aneurysm Brother    Bladder Cancer Neg Hx    Prostate cancer Neg Hx    Kidney cancer Neg Hx     Allergies  Allergen Reactions   Penicillins Nausea Only    Has patient had a PCN reaction causing immediate rash, facial/tongue/throat swelling, SOB or lightheadedness with hypotension: no Has patient had a PCN reaction causing severe rash involving mucus membranes or skin necrosis: no Has patient had a PCN reaction that required  hospitalization: no Has patient had a PCN reaction occurring within the last 10 years: no If all of the above answers are "NO", then may proceed with Cephalosporin use.    Sulfa Antibiotics Diarrhea     REVIEW OF SYSTEMS (Negative unless checked)  Constitutional: '[]'$ Weight loss  '[]'$ Fever  '[]'$ Chills Cardiac: '[]'$ Chest pain   '[]'$ Chest pressure   '[]'$ Palpitations   '[]'$ Shortness of breath when laying flat   '[]'$ Shortness of breath with exertion. Vascular:  '[x]'$ Pain in legs with walking   '[]'$ Pain in legs at rest  '[]'$ History of DVT   '[]'$ Phlebitis   '[]'$ Swelling in legs   '[]'$ Varicose veins   '[]'$ Non-healing ulcers Pulmonary:   '[]'$   Uses home oxygen   '[]'$ Productive cough   '[]'$ Hemoptysis   '[]'$ Wheeze  '[]'$ COPD   '[]'$ Asthma Neurologic:  '[]'$ Dizziness   '[]'$ Seizures   '[]'$ History of stroke   '[]'$ History of TIA  '[]'$ Aphasia   '[]'$ Vissual changes   '[]'$ Weakness or numbness in arm   '[]'$ Weakness or numbness in leg Musculoskeletal:   '[]'$ Joint swelling   '[]'$ Joint pain   '[]'$ Low back pain Hematologic:  '[]'$ Easy bruising  '[]'$ Easy bleeding   '[]'$ Hypercoagulable state   '[]'$ Anemic Gastrointestinal:  '[]'$ Diarrhea   '[]'$ Vomiting  '[]'$ Gastroesophageal reflux/heartburn   '[]'$ Difficulty swallowing. Genitourinary:  '[]'$ Chronic kidney disease   '[]'$ Difficult urination  '[]'$ Frequent urination   '[]'$ Blood in urine Skin:  '[]'$ Rashes   '[]'$ Ulcers  Psychological:  '[]'$ History of anxiety   '[]'$  History of major depression.  Physical Examination  There were no vitals filed for this visit. There is no height or weight on file to calculate BMI. Gen: WD/WN, NAD Head: /AT, No temporalis wasting.  Ear/Nose/Throat: Hearing grossly intact, nares w/o erythema or drainage Eyes: PER, EOMI, sclera nonicteric.  Neck: Supple, no masses.  No bruit or JVD.  Pulmonary:  Good air movement, no audible wheezing, no use of accessory muscles.  Cardiac: RRR, normal S1, S2, no Murmurs. Vascular:  mild trophic changes, no open wounds Vessel Right Left  Radial Palpable Palpable  PT Trace  Palpable Trace Palpable  DP  Trace  Palpable Trace  Palpable  Gastrointestinal: soft, non-distended. No guarding/no peritoneal signs.  Musculoskeletal: M/S 5/5 throughout.  No visible deformity.  Neurologic: CN 2-12 intact. Pain and light touch intact in extremities.  Symmetrical.  Speech is fluent. Motor exam as listed above. Psychiatric: Judgment intact, Mood & affect appropriate for pt's clinical situation. Dermatologic: No rashes or ulcers noted.  No changes consistent with cellulitis.   CBC Lab Results  Component Value Date   WBC 7.4 05/25/2021   HGB 13.8 05/25/2021   HCT 40.7 05/25/2021   MCV 101 (H) 05/25/2021   PLT 238 05/25/2021    BMET    Component Value Date/Time   NA 142 05/25/2021 0950   K 4.2 05/25/2021 0950   CL 103 05/25/2021 0950   CO2 26 05/25/2021 0950   GLUCOSE 102 (H) 05/25/2021 0950   GLUCOSE 114 (H) 09/16/2019 1032   BUN 9 05/25/2021 0950   CREATININE 0.89 05/25/2021 0950   CALCIUM 9.4 05/25/2021 0950   GFRNONAA >60 09/16/2019 1032   GFRAA >60 09/16/2019 1032   CrCl cannot be calculated (Patient's most recent lab result is older than the maximum 21 days allowed.).  COAG Lab Results  Component Value Date   INR 0.98 11/02/2017    Radiology No results found.   Assessment/Plan 1. Atherosclerosis of native artery of both lower extremities with intermittent claudication (HCC)  Recommend:  The patient has evidence of atherosclerosis of the lower extremities with claudication.  The patient does not voice lifestyle limiting changes at this point in time.  Noninvasive studies do not suggest clinically significant change.  No invasive studies, angiography or surgery at this time The patient should continue walking and begin a more formal exercise program.  The patient should continue antiplatelet therapy and aggressive treatment of the lipid abnormalities  No changes in the patient's medications at this time  Continued surveillance is indicated as atherosclerosis is likely  to progress with time.    The patient will continue follow up with noninvasive studies as ordered.   - VAS [place] ABI WITH/WO TBI; Future  2. Bilateral carotid artery stenosis Recommend:  Given the patient's asymptomatic subcritical stenosis no further invasive testing or surgery at this time.   Continue antiplatelet therapy as prescribed Continue management of CAD, HTN and Hyperlipidemia Healthy heart diet,  encouraged exercise at least 4 times per week Follow up in 12 months with duplex ultrasound and physical exam   3. Chronic venous insufficiency  No surgery or intervention at this point in time.     I have reviewed my previous discussion with the patient regarding swelling and why it  causes symptoms.  The patient is doing well with compression and will continue wearing graduated compression stockings class 1 (20-30 mmHg) on a daily basis a prescription was given. The patient will  continue wearing the stockings first thing in the morning and removing them in the evening. The patient is instructed specifically not to sleep in the stockings.     In addition, behavioral modification including elevation during the day and exercise will be continued.      4. Coronary artery disease involving native coronary artery of native heart without angina pectoris Continue cardiac and antihypertensive medications as already ordered and reviewed, no changes at this time.  Continue statin as ordered and reviewed, no changes at this time  Nitrates PRN for chest pain   5. Benign essential HTN Continue antihypertensive medications as already ordered, these medications have been reviewed and there are no changes at this time.     Hortencia Pilar, MD  06/27/2021 1:09 PM

## 2021-06-28 ENCOUNTER — Ambulatory Visit (INDEPENDENT_AMBULATORY_CARE_PROVIDER_SITE_OTHER): Payer: Medicare Other

## 2021-06-28 ENCOUNTER — Encounter (INDEPENDENT_AMBULATORY_CARE_PROVIDER_SITE_OTHER): Payer: Self-pay | Admitting: Vascular Surgery

## 2021-06-28 ENCOUNTER — Ambulatory Visit (INDEPENDENT_AMBULATORY_CARE_PROVIDER_SITE_OTHER): Payer: Medicare Other | Admitting: Vascular Surgery

## 2021-06-28 VITALS — BP 123/64 | HR 97 | Resp 18 | Ht 66.0 in | Wt 131.0 lb

## 2021-06-28 DIAGNOSIS — I872 Venous insufficiency (chronic) (peripheral): Secondary | ICD-10-CM

## 2021-06-28 DIAGNOSIS — I6523 Occlusion and stenosis of bilateral carotid arteries: Secondary | ICD-10-CM

## 2021-06-28 DIAGNOSIS — I2510 Atherosclerotic heart disease of native coronary artery without angina pectoris: Secondary | ICD-10-CM | POA: Diagnosis not present

## 2021-06-28 DIAGNOSIS — I70213 Atherosclerosis of native arteries of extremities with intermittent claudication, bilateral legs: Secondary | ICD-10-CM | POA: Diagnosis not present

## 2021-06-28 DIAGNOSIS — I10 Essential (primary) hypertension: Secondary | ICD-10-CM

## 2021-07-04 ENCOUNTER — Encounter (INDEPENDENT_AMBULATORY_CARE_PROVIDER_SITE_OTHER): Payer: Self-pay | Admitting: Vascular Surgery

## 2021-07-06 ENCOUNTER — Emergency Department
Admission: EM | Admit: 2021-07-06 | Discharge: 2021-07-07 | Disposition: A | Discharge status: Home / Self Care | Payer: Medicare Other | Attending: Emergency Medicine | Admitting: Emergency Medicine

## 2021-07-06 ENCOUNTER — Ambulatory Visit (INDEPENDENT_AMBULATORY_CARE_PROVIDER_SITE_OTHER): Payer: Medicare Other | Admitting: Urology

## 2021-07-06 ENCOUNTER — Encounter: Payer: Self-pay | Admitting: Emergency Medicine

## 2021-07-06 ENCOUNTER — Telehealth: Payer: Self-pay | Admitting: *Deleted

## 2021-07-06 VITALS — BP 129/76 | HR 96 | Ht 65.0 in | Wt 130.0 lb

## 2021-07-06 DIAGNOSIS — R339 Retention of urine, unspecified: Secondary | ICD-10-CM | POA: Diagnosis not present

## 2021-07-06 DIAGNOSIS — Z8551 Personal history of malignant neoplasm of bladder: Secondary | ICD-10-CM

## 2021-07-06 DIAGNOSIS — R1030 Lower abdominal pain, unspecified: Secondary | ICD-10-CM | POA: Diagnosis not present

## 2021-07-06 DIAGNOSIS — N35912 Unspecified bulbous urethral stricture, male: Secondary | ICD-10-CM | POA: Diagnosis not present

## 2021-07-06 DIAGNOSIS — Z8546 Personal history of malignant neoplasm of prostate: Secondary | ICD-10-CM

## 2021-07-06 DIAGNOSIS — N35919 Unspecified urethral stricture, male, unspecified site: Secondary | ICD-10-CM

## 2021-07-06 LAB — CBC WITH DIFFERENTIAL/PLATELET
Abs Immature Granulocytes: 0.08 10*3/uL — ABNORMAL HIGH (ref 0.00–0.07)
Basophils Absolute: 0.1 10*3/uL (ref 0.0–0.1)
Basophils Relative: 0 %
Eosinophils Absolute: 0 10*3/uL (ref 0.0–0.5)
Eosinophils Relative: 0 %
HCT: 38.3 % — ABNORMAL LOW (ref 39.0–52.0)
Hemoglobin: 13.4 g/dL (ref 13.0–17.0)
Immature Granulocytes: 1 %
Lymphocytes Relative: 8 %
Lymphs Abs: 1.1 10*3/uL (ref 0.7–4.0)
MCH: 34.4 pg — ABNORMAL HIGH (ref 26.0–34.0)
MCHC: 35 g/dL (ref 30.0–36.0)
MCV: 98.2 fL (ref 80.0–100.0)
Monocytes Absolute: 0.8 10*3/uL (ref 0.1–1.0)
Monocytes Relative: 6 %
Neutro Abs: 11.2 10*3/uL — ABNORMAL HIGH (ref 1.7–7.7)
Neutrophils Relative %: 85 %
Platelets: 223 10*3/uL (ref 150–400)
RBC: 3.9 MIL/uL — ABNORMAL LOW (ref 4.22–5.81)
RDW: 12.8 % (ref 11.5–15.5)
WBC: 13.2 10*3/uL — ABNORMAL HIGH (ref 4.0–10.5)
nRBC: 0 % (ref 0.0–0.2)

## 2021-07-06 LAB — COMPREHENSIVE METABOLIC PANEL WITH GFR
ALT: 27 U/L (ref 0–44)
AST: 30 U/L (ref 15–41)
Albumin: 4.3 g/dL (ref 3.5–5.0)
Alkaline Phosphatase: 61 U/L (ref 38–126)
Anion gap: 12 (ref 5–15)
BUN: 14 mg/dL (ref 8–23)
CO2: 22 mmol/L (ref 22–32)
Calcium: 8.9 mg/dL (ref 8.9–10.3)
Chloride: 99 mmol/L (ref 98–111)
Creatinine, Ser: 0.78 mg/dL (ref 0.61–1.24)
GFR, Estimated: >60 mL/min (ref >60)
Glucose, Bld: 133 mg/dL — ABNORMAL HIGH (ref 70–99)
Potassium: 3.6 mmol/L (ref 3.5–5.1)
Sodium: 133 mmol/L — ABNORMAL LOW (ref 135–145)
Total Bilirubin: 1 mg/dL (ref 0.3–1.2)
Total Protein: 7.1 g/dL (ref 6.5–8.1)

## 2021-07-06 LAB — URINALYSIS, COMPLETE
Bilirubin, UA: NEGATIVE
Glucose, UA: NEGATIVE
Ketones, UA: NEGATIVE
Leukocytes,UA: NEGATIVE
Nitrite, UA: NEGATIVE
Protein,UA: NEGATIVE
Specific Gravity, UA: >1.03 — ABNORMAL HIGH (ref 1.005–1.030)
Urobilinogen, Ur: 0.2 mg/dL (ref 0.2–1.0)
pH, UA: 5.5 (ref 5.0–7.5)

## 2021-07-06 LAB — MICROSCOPIC EXAMINATION: Bacteria, UA: NONE SEEN

## 2021-07-06 MED ORDER — CEPHALEXIN 250 MG PO CAPS
500.0000 mg | ORAL_CAPSULE | Freq: Once | ORAL | Status: AC
  Administered 2021-07-06: 500 mg via ORAL

## 2021-07-06 MED ORDER — MORPHINE SULFATE (PF) 4 MG/ML IV SOLN
4.0000 mg | Freq: Once | INTRAVENOUS | Status: AC
  Administered 2021-07-06: 4 mg via INTRAVENOUS
  Filled 2021-07-06: qty 1

## 2021-07-06 NOTE — Telephone Encounter (Signed)
Patient called in today and states he is having some blood in his urine. I advised him that was normal after the cysto. Drink lots of water. He may have a alittle burning with urination also. He will call back if the bleeding dont stop .

## 2021-07-06 NOTE — ED Notes (Signed)
Foley insertion attempt x 2 using a 35F non-latex, straight tip, single lumen cath and an 90F coude cath without success. Pt tolerated fairly. Dr. Archie Balboa notified and aware of unsuccessful attempts at foley insertion.Urology called and is on the way.

## 2021-07-06 NOTE — Progress Notes (Signed)
   07/06/2021 CC:  Chief Complaint  Patient presents with   Cysto    HPI: Dylan Villanueva is a 81 y.o. 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   He is s/p radiation for prostate cancer and had a bulbar urethral stricture.  He was taken to the OR on 09/23/2019 for  urethral dilation as well as TURBT of a tumor noted at the time, proximally 1 cm at the left bladder neck.  Surgical pathology was consistent with low-grade noninvasive tumor.  He reports that he has been voiding fine.  No gross hematuria or any other issues.    NED. A&Ox3.   No respiratory distress   Abd soft, NT, ND Normal phallus with bilateral descended testicles  Cystoscopy Procedure Note  Patient identification was confirmed, informed consent was obtained, and patient was prepped using Betadine solution.  Lidocaine jelly was administered per urethral meatus.     Pre-Procedure: - Inspection reveals a normal caliber ureteral meatus.  Procedure: The flexible cystoscope was introduced without difficulty - At junction of bulbar urethra there was a narrow area scope was advanced over a wire'  - Normal prostate  - Normal bladder neck, previous TUR site with stellate scar left bladder neck. - Bilateral ureteral orifices identified - Bladder mucosa  reveals no ulcers, tumors, or lesions - No bladder stones -Mild trabeculation  Retroflexion shows unremarkable    Post-Procedure: - Patient tolerated the procedure well  Plan manipulation with wire today, we did give him a dose of Keflex x1   Assessment/ Plan:  1. History of bladder cancer - No evidence of disease - Per NCCN guidelines, will plan for surveillance cystoscopy in about 6 months x 2 years then less frequently thereafter; will transition to annual given absence of recurrence - Urinalysis, Complete  2. Postprocedural bulbous urethral stricture - Cystoscopy over a wire today -Asymptomatic  from this  1 year with cystoscopy  I,Kailey Littlejohn,acting as a scribe for Hollice Espy, MD.,have documented all relevant documentation on the behalf of Hollice Espy, MD,as directed by  Hollice Espy, MD while in the presence of Hollice Espy, MD.  I have reviewed the above documentation for accuracy and completeness, and I agree with the above.   Hollice Espy, MD

## 2021-07-06 NOTE — ED Notes (Signed)
Urologist at the bedside to insert urinary catheter

## 2021-07-06 NOTE — Consult Note (Signed)
Urology Consult  Requesting physician: Dr. Archie Balboa  Reason for consultation: Urinary retention with inability to place Foley catheter  Chief Complaint: Unable to urinate  History of Present Illness: Dylan Villanueva is a 81 y.o. male with a history of prostate cancer, bulbar urethral stricture and low risk bladder cancer seen in the office today for surveillance cystoscopy.  He was noted to have a narrowed area in the bulbar urethra which required advancement of the cystoscope over a wire.  Otherwise the surveillance cystoscopy was negative.  He had no voiding complaints prior to the visit.  He had progressive slowing of his urinary stream with hematuria and contacted the office this afternoon and was advised to increase fluids.  He drank 56 ounces of water with progressive pain and inability to void necessitating ED visit.  ED staff unsuccessful at placing 16 and 18 [place] coud catheters.  Bladder scan volume estimated at ~ 1000 mL  Past Medical History:  Diagnosis Date   Cancer [facility]) 2006   prostate    Colon polyps    Coronary artery disease    Dyspnea    With exertion climbing up hill   Hyperlipidemia    Hypertension    Myocardial infarction [facility])    Prostate CA ([place])    Smoker    Squamous acanthoma of external ear, right     Past Surgical History:  Procedure Laterality Date   APPENDECTOMY  1980   CARDIAC SURGERY     COLONOSCOPY WITH PROPOFOL N/A 03/02/2018   Procedure: COLONOSCOPY WITH PROPOFOL;  Surgeon: [name], MD;  Location: [place];  Service: Endoscopy;  Laterality: N/A;   CORONARY ANGIOPLASTY WITH STENT PLACEMENT     CYSTOSCOPY W/ RETROGRADES Bilateral 09/23/2019   Procedure: CYSTOSCOPY WITH RETROGRADE PYELOGRAM;  Surgeon: Hollice Espy, MD;  Location: [facility];  Service: Urology;  Laterality: Bilateral;   CYSTOSCOPY WITH URETHRAL DILATATION N/A 09/23/2019   Procedure: CYSTOSCOPY WITH URETHRAL DILATATION;  Surgeon: Hollice Espy, MD;  Location: [facility]  [facility];  Service: Urology;  Laterality: N/A;   ESOPHAGOGASTRODUODENOSCOPY N/A 11/07/2017   Procedure: ESOPHAGOGASTRODUODENOSCOPY (EGD);  Surgeon: Jules Husbands, MD;  Location: [facility];  Service: General;  Laterality: N/A;   ESOPHAGOGASTRODUODENOSCOPY (EGD) WITH PROPOFOL  03/02/2018   Procedure: ESOPHAGOGASTRODUODENOSCOPY (EGD) WITH PROPOFOL;  Surgeon: [name], MD;  Location: [place];  Service: Endoscopy;;   EUS N/A 07/13/2017   Procedure: FULL UPPER ENDOSCOPIC ULTRASOUND (EUS) RADIAL;  Surgeon: Jola Schmidt, MD;  Location: [facility];  Service: Endoscopy;  Laterality: N/A;   HIGH DOSE RADIATION IMPLANT INSERTION  2006   LAPAROSCOPIC REMOVAL ABDOMINAL MASS N/A 11/07/2017   Procedure: LAPAROSCOPIC GASTRIC MASS RESECTION;  Surgeon: Jules Husbands, MD;  Location: [facility];  Service: General;  Laterality: N/A;   LOWER EXTREMITY ANGIOGRAPHY Left 07/04/2018   Procedure: LOWER EXTREMITY ANGIOGRAPHY;  Surgeon: Katha Cabal, MD;  Location: [place] [facility];  Service: Cardiovascular;  Laterality: Left;   LOWER EXTREMITY ANGIOGRAPHY Left 08/10/2018   Procedure: LOWER EXTREMITY ANGIOGRAPHY;  Surgeon: Katha Cabal, MD;  Location: [place] [facility];  Service: Cardiovascular;  Laterality: Left;   LOWER EXTREMITY ANGIOGRAPHY Left 09/12/2018   Procedure: LOWER EXTREMITY ANGIOGRAPHY;  Surgeon: Katha Cabal, MD;  Location: [place] [facility];  Service: Cardiovascular;  Laterality: Left;   POLYPECTOMY  03/02/2018   Procedure: POLYPECTOMY;  Surgeon: [name], MD;  Location: [place];  Service: Endoscopy;;   TRANSURETHRAL RESECTION OF BLADDER TUMOR N/A 09/23/2019   Procedure:  TRANSURETHRAL RESECTION OF BLADDER TUMOR (TURBT);  Surgeon: Hollice Espy, MD;  Location: [facility];  Service: Urology;  Laterality: N/A;    Home Medications:  Current Meds  Medication Sig   amLODipine (NORVASC) 10 MG tablet Take 1 tablet (10 mg total) by mouth daily.   atorvastatin  (LIPITOR) 40 MG tablet Take 1 tablet (40 mg total) by mouth daily.   cilostazol (PLETAL) 100 MG tablet Take 1 tablet by mouth twice a day   clopidogrel (PLAVIX) 75 MG tablet Take 1 tablet (75 mg total) by mouth daily. TAKE 1 TABLET(75 MG) BY MOUTH DAILY   lisinopril (ZESTRIL) 5 MG tablet Take 1 tablet (5 mg total) by mouth daily.   Multiple Vitamins-Minerals (MULTIVITAMIN ADULT PO) Take 1 tablet by mouth daily.    Vitamin D3 (VITAMIN D) 25 MCG tablet Take 1 tablet by mouth daily.    Allergies:  Allergies  Allergen Reactions   Penicillins Nausea Only    Has patient had a PCN reaction causing immediate rash, facial/tongue/throat swelling, SOB or lightheadedness with hypotension: no Has patient had a PCN reaction causing severe rash involving mucus membranes or skin necrosis: no Has patient had a PCN reaction that required hospitalization: no Has patient had a PCN reaction occurring within the last 10 years: no If all of the above answers are "NO", then may proceed with Cephalosporin use.    Sulfa Antibiotics Diarrhea    Family History  Problem Relation Age of Onset   Cancer Mother        pancreatic   Dementia Father    Cancer Sister        breast    Aneurysm Brother    Bladder Cancer Neg Hx    Prostate cancer Neg Hx    Kidney cancer Neg Hx     Social History:  reports that he has been smoking cigarettes. He started smoking about 68 years ago. He has a 66.00 pack-year smoking history. He has never used smokeless tobacco. He reports current alcohol use of about 3.0 standard drinks per week. He reports that he does not use drugs.  ROS: Otherwise noncontributory septa as per the HPI  Physical Exam:  Vital signs in last 24 hours: Temp:  [98 F (36.7 C)] 98 F (36.7 C) (05/23 2041) Pulse Rate:  [63-96] 63 (05/23 2300) Resp:  [18-21] 18 (05/23 2200) BP: (129-155)/(65-89) 133/67 (05/23 2300) SpO2:  [96 %-100 %] 96 % (05/23 2300) Weight:  [59 kg] 59 kg (05/23  1034) Constitutional:  Alert and oriented, No moderate distress secondary retention Respiratory: Normal respiratory effort, lungs clear bilaterally GI: Abdomen with tender, palpable bladder GU: Phallus without lesions.  Some blood at meatus Skin: No rashes, bruises or suspicious lesions Lymph: No cervical or inguinal adenopathy Neurologic: Grossly intact, no focal deficits, moving all 4 extremities Psychiatric: Normal mood and affect  External genitalia were prepped and draped.  A 12 French Foley catheter was placed without difficulty.  Blood-tinged urine was obtained.  Balloon was inflated with 10 mL of sterile water and catheter placed to gravity drainage  Laboratory Data:  Recent Labs    07/06/21 2049  WBC 13.2*  HGB 13.4  HCT 38.3*   Recent Labs    07/06/21 2049  NA 133*  K 3.6  CL 99  CO2 22  GLUCOSE 133*  BUN 14  CREATININE 0.78  CALCIUM 8.9   No results for input(s): LABPT, INR in the last 72 hours. No results for input(s): LABURIN in the last  72 hours. Results for orders placed or performed in visit on 07/06/21  Microscopic Examination     Status: None   Collection Time: 07/06/21 10:19 AM   Urine  Result Value Ref Range Status   WBC, UA 0-5 0 - 5 /hpf Final   RBC 0-2 0 - 2 /hpf Final   Epithelial Cells (non renal) 0-10 0 - 10 /hpf Final   Bacteria, UA None seen None seen/Few Final     Impression/Assessment:  Post cystoscopy urinary retention  Plan:  Indwelling Foley catheter x72 hours Cath removal/voiding trial Friday, 07/09/2021   07/06/2021, 11:18 PM  John Giovanni,  MD

## 2021-07-06 NOTE — ED Provider Notes (Signed)
[facility] Provider Note    Event Date/Time   First MD Initiated Contact with Patient 07/06/21 2054     (approximate)   History   Post-op Problem   HPI  Dylan Villanueva is a 81 y.o. male  who, per urology note from earlier today had a surveillance cystoscopy, who presents to the emergency department today because of concerns for urinary retention.  The patient states that after his cystoscopy he noticed blood but states he did not have any good urine.  When he called the urology office and recommended he drink more water so he tried that however continued not to be able to urinate.  He does have lower abdominal pain.  He states that he is had urinary retention requiring catheterization once in the past.  Physical Exam   Triage Vital Signs: ED Triage Vitals  Enc Vitals Group     BP 07/06/21 2041 (!) 155/76     Pulse Rate 07/06/21 2041 83     Resp 07/06/21 2041 20     Temp 07/06/21 2041 98 F (36.7 C)     Temp Source 07/06/21 2041 Oral     SpO2 07/06/21 2041 100 %     Weight --      Height --      Head Circumference --      Peak Flow --      Pain Score 07/06/21 2047 10     Pain Loc --      Pain Edu? --      Excl. in [place]? --     Most recent vital signs: Vitals:   07/06/21 2041  BP: (!) 155/76  Pulse: 83  Resp: 20  Temp: 98 F (36.7 C)  SpO2: 100%   General: Awake, alert, oriented. CV:  Good peripheral perfusion.  Resp:  Normal effort.  Abd:  Tender to palpation in the suprapubic region.   ED Results / Procedures / Treatments   Labs (all labs ordered are listed, but only abnormal results are displayed) Labs Reviewed  CBC WITH DIFFERENTIAL/PLATELET - Abnormal; Notable for the following components:      Result Value   WBC 13.2 (*)    RBC 3.90 (*)    HCT 38.3 (*)    MCH 34.4 (*)    Neutro Abs 11.2 (*)    Abs Immature Granulocytes 0.08 (*)    All other components within normal limits  COMPREHENSIVE METABOLIC PANEL - Abnormal; Notable  for the following components:   Sodium 133 (*)    Glucose, Bld 133 (*)    All other components within normal limits  URINALYSIS, ROUTINE W REFLEX MICROSCOPIC     EKG  None   RADIOLOGY None   PROCEDURES:  Critical Care performed: No  Procedures   MEDICATIONS ORDERED IN ED: Medications - No data to display   IMPRESSION / MDM / [place] / ED COURSE  I reviewed the triage vital signs and the nursing notes.                              Differential diagnosis includes, but is not limited to, urinary retention secondary to prostate enlargement, urinary retention secondary to blood clot, urinary retention secondary to swelling secondary to procedure.  Patient presented to the emergency department today with complaints of urinary retention after having a cystoscopy performed earlier today.  Nursing staff unable to place coud catheter.  Dr. Bernardo Heater  with urology was consulted who was able to place a catheter.  Patient did feel improvement after catheter placement.  Will discharge to follow-up with urology.  FINAL CLINICAL IMPRESSION(S) / ED DIAGNOSES   Final diagnoses:  Urinary retention     Note:  This document was prepared using Dragon voice recognition software and may include unintentional dictation errors.    Nance Pear, MD 07/06/21 [phone]

## 2021-07-06 NOTE — ED Notes (Signed)
Urology Cart at the bedside  

## 2021-07-06 NOTE — ED Triage Notes (Signed)
Pt presents via POV with complaints of urinary retention following a cystoscopy this morning. He notes taking the prescribed antibiotic and pain meds and has consumed over 50oz of water today and he hasnt been able to void. He states when trying to urinate he is only have streaks of blood and clots come out.  Denies CP, SOB, N/V.    Pt bladder scanned in triage >1062m

## 2021-07-06 NOTE — Discharge Instructions (Addendum)
Please be sure to follow up with urology clinic. Please return if you stop having any output in your catheter or feel your stomach is swelling and the pain is increasing.

## 2021-07-06 NOTE — ED Notes (Signed)
Awaiting urine to drain from pt's bladder before leg bag is placed and then pt will be discharged home. No acute distress noted. Daughter remains at the bedside

## 2021-07-07 ENCOUNTER — Telehealth: Payer: Self-pay

## 2021-07-07 LAB — PSA: Prostate Specific Ag, Serum: <0.1 ng/mL (ref 0.0–4.0)

## 2021-07-07 NOTE — Telephone Encounter (Signed)
Left message for pt to return my call regarding follow up scheduled on Friday.

## 2021-07-07 NOTE — ED Notes (Signed)
Foley bag detached from catheter and leg bag placed and secured around pt's leg. Approx 1549m's clear, reddish pink urine drained from the bladder.

## 2021-07-09 ENCOUNTER — Encounter: Payer: Self-pay | Admitting: Physician Assistant

## 2021-07-09 ENCOUNTER — Ambulatory Visit: Payer: Medicare Other | Admitting: Physician Assistant

## 2021-07-09 ENCOUNTER — Ambulatory Visit (INDEPENDENT_AMBULATORY_CARE_PROVIDER_SITE_OTHER): Payer: Medicare Other | Admitting: Physician Assistant

## 2021-07-09 VITALS — BP 126/69 | HR 82 | Ht 65.0 in | Wt 130.0 lb

## 2021-07-09 DIAGNOSIS — R339 Retention of urine, unspecified: Secondary | ICD-10-CM

## 2021-07-09 DIAGNOSIS — N35919 Unspecified urethral stricture, male, unspecified site: Secondary | ICD-10-CM

## 2021-07-09 LAB — BLADDER SCAN AMB NON-IMAGING: Scan Result: 50

## 2021-07-09 NOTE — Progress Notes (Signed)
07/09/2021 2:21 PM   Dylan Villanueva 1940/07/28 [id number]  CC: Chief Complaint  Patient presents with   Urinary Retention   HPI: Dylan Villanueva is a 81 y.o. male with PMH prostate cancer s/p radiation therapy with bulbar urethral stricture and low risk bladder cancer who underwent surveillance cystoscopy with Dr. Erlene Quan 3 days ago and developed urinary retention thereafter associated with gross hematuria who presents today for voiding trial after Foley catheter was placed in the emergency department by Dr. Bernardo Heater after 2 failed attempts by ED staff.   Today he reports his gross hematuria has improved.  Foley catheter was removed in the morning, see separate procedure note for details.  He returned to clinic in the afternoon for PVR.  He reports drinking 24 ounces of fluid and has been able to urinate once.  PVR 50 mL.  PMH: Past Medical History:  Diagnosis Date   Cancer [facility]) 2006   prostate    Colon polyps    Coronary artery disease    Dyspnea    With exertion climbing up hill   Hyperlipidemia    Hypertension    Myocardial infarction [facility])    Prostate CA ([place])    Smoker    Squamous acanthoma of external ear, right     Surgical History: Past Surgical History:  Procedure Laterality Date   APPENDECTOMY  1980   CARDIAC SURGERY     COLONOSCOPY WITH PROPOFOL N/A 03/02/2018   Procedure: COLONOSCOPY WITH PROPOFOL;  Surgeon: [name], MD;  Location: [place];  Service: Endoscopy;  Laterality: N/A;   CORONARY ANGIOPLASTY WITH STENT PLACEMENT     CYSTOSCOPY W/ RETROGRADES Bilateral 09/23/2019   Procedure: CYSTOSCOPY WITH RETROGRADE PYELOGRAM;  Surgeon: Hollice Espy, MD;  Location: [facility];  Service: Urology;  Laterality: Bilateral;   CYSTOSCOPY WITH URETHRAL DILATATION N/A 09/23/2019   Procedure: CYSTOSCOPY WITH URETHRAL DILATATION;  Surgeon: Hollice Espy, MD;  Location: [facility];  Service: Urology;  Laterality: N/A;   ESOPHAGOGASTRODUODENOSCOPY N/A 11/07/2017    Procedure: ESOPHAGOGASTRODUODENOSCOPY (EGD);  Surgeon: Jules Husbands, MD;  Location: [facility];  Service: General;  Laterality: N/A;   ESOPHAGOGASTRODUODENOSCOPY (EGD) WITH PROPOFOL  03/02/2018   Procedure: ESOPHAGOGASTRODUODENOSCOPY (EGD) WITH PROPOFOL;  Surgeon: [name], MD;  Location: [place];  Service: Endoscopy;;   EUS N/A 07/13/2017   Procedure: FULL UPPER ENDOSCOPIC ULTRASOUND (EUS) RADIAL;  Surgeon: Jola Schmidt, MD;  Location: [facility];  Service: Endoscopy;  Laterality: N/A;   HIGH DOSE RADIATION IMPLANT INSERTION  2006   LAPAROSCOPIC REMOVAL ABDOMINAL MASS N/A 11/07/2017   Procedure: LAPAROSCOPIC GASTRIC MASS RESECTION;  Surgeon: Jules Husbands, MD;  Location: [facility];  Service: General;  Laterality: N/A;   LOWER EXTREMITY ANGIOGRAPHY Left 07/04/2018   Procedure: LOWER EXTREMITY ANGIOGRAPHY;  Surgeon: Katha Cabal, MD;  Location: [place] [facility];  Service: Cardiovascular;  Laterality: Left;   LOWER EXTREMITY ANGIOGRAPHY Left 08/10/2018   Procedure: LOWER EXTREMITY ANGIOGRAPHY;  Surgeon: Katha Cabal, MD;  Location: [place] [facility];  Service: Cardiovascular;  Laterality: Left;   LOWER EXTREMITY ANGIOGRAPHY Left 09/12/2018   Procedure: LOWER EXTREMITY ANGIOGRAPHY;  Surgeon: Katha Cabal, MD;  Location: [place] [facility];  Service: Cardiovascular;  Laterality: Left;   POLYPECTOMY  03/02/2018   Procedure: POLYPECTOMY;  Surgeon: [name], MD;  Location: [place];  Service: Endoscopy;;   TRANSURETHRAL RESECTION OF BLADDER TUMOR N/A 09/23/2019   Procedure: TRANSURETHRAL RESECTION OF BLADDER TUMOR (TURBT);  Surgeon: Hollice Espy, MD;  Location: [facility];  Service: Urology;  Laterality: N/A;    Home Medications:  Allergies as of 07/09/2021       Reactions   Penicillins Nausea Only   Has patient had a PCN reaction causing immediate rash, facial/tongue/throat swelling, SOB or lightheadedness with hypotension: no Has patient had a  PCN reaction causing severe rash involving mucus membranes or skin necrosis: no Has patient had a PCN reaction that required hospitalization: no Has patient had a PCN reaction occurring within the last 10 years: no If all of the above answers are "NO", then may proceed with Cephalosporin use.   Sulfa Antibiotics Diarrhea        Medication List        Accurate as of Jul 09, 2021  2:21 PM. If you have any questions, ask your nurse or doctor.          amLODipine 10 MG tablet Commonly known as: NORVASC Take 1 tablet (10 mg total) by mouth daily.   atorvastatin 40 MG tablet Commonly known as: LIPITOR Take 1 tablet (40 mg total) by mouth daily.   cilostazol 100 MG tablet Commonly known as: PLETAL Take 1 tablet by mouth twice a day   clopidogrel 75 MG tablet Commonly known as: PLAVIX Take 1 tablet (75 mg total) by mouth daily. TAKE 1 TABLET(75 MG) BY MOUTH DAILY   lisinopril 5 MG tablet Commonly known as: ZESTRIL Take 1 tablet (5 mg total) by mouth daily.   MULTIVITAMIN ADULT PO Take 1 tablet by mouth daily.   Vitamin D3 25 MCG tablet Commonly known as: Vitamin D Take 1 tablet by mouth daily.        Allergies:  Allergies  Allergen Reactions   Penicillins Nausea Only    Has patient had a PCN reaction causing immediate rash, facial/tongue/throat swelling, SOB or lightheadedness with hypotension: no Has patient had a PCN reaction causing severe rash involving mucus membranes or skin necrosis: no Has patient had a PCN reaction that required hospitalization: no Has patient had a PCN reaction occurring within the last 10 years: no If all of the above answers are "NO", then may proceed with Cephalosporin use.    Sulfa Antibiotics Diarrhea    Family History: Family History  Problem Relation Age of Onset   Cancer Mother        pancreatic   Dementia Father    Cancer Sister        breast    Aneurysm Brother    Bladder Cancer Neg Hx    Prostate cancer Neg Hx     Kidney cancer Neg Hx     Social History:   reports that he has been smoking cigarettes. He started smoking about 68 years ago. He has a 66.00 pack-year smoking history. He has never used smokeless tobacco. He reports current alcohol use of about 3.0 standard drinks per week. He reports that he does not use drugs.  Physical Exam: BP 126/69   Pulse 82   Ht '5\' 5"'$  (1.651 m)   Wt 130 lb (59 kg)   BMI 21.63 kg/m   Constitutional:  Alert and oriented, no acute distress, nontoxic appearing HEENT: [place], AT Cardiovascular: No clubbing, cyanosis, or edema Respiratory: Normal respiratory effort, no increased work of breathing Skin: No rashes, bruises or suspicious lesions Neurologic: Grossly intact, no focal deficits, moving all 4 extremities Psychiatric: Normal mood and affect  Laboratory Data: Results for orders placed or performed in visit on 07/09/21  Bladder Scan (Post Void  Residual) in office  Result Value Ref Range   Scan Result 50    Assessment & Plan:   1. Urinary retention Voiding trial passed today.  We discussed return precautions including significant gross hematuria or recurrent urinary retention.  Counseled him to proceed to the emergency department over the weekend if these occur, as we will be closed due to the holiday.  If he does require Foley catheter replacement in the emergency department, recommend placement of a 12 French straight tip Foley catheter given his history of stricture.  Counseled patient to notify ED staff of this as well. - Bladder Scan (Post Void Residual) in office  Return if symptoms worsen or fail to improve.  Debroah Loop, PA-C  [facility] [address] [place], [place] [address] [phone]

## 2021-08-16 ENCOUNTER — Encounter: Payer: Medicare Other | Admitting: Family Medicine

## 2021-08-16 ENCOUNTER — Telehealth: Payer: Self-pay | Admitting: Family Medicine

## 2021-08-16 NOTE — Telephone Encounter (Signed)
Patient came into the office for a wellness visit, and I explained that it showed a video visit in the notes. I also explained to patient that the NP is Bonna Gains, to which whom is suppose to call for the video visit. I was unsure of whom the NP was and informed him that he may get a call from her, but patient stated that he does not use his cell phone often and would not know how to complete a video visit. Patient would like information on this matter if we find out more.

## 2021-08-18 NOTE — Telephone Encounter (Signed)
See below.  Thank you

## 2021-08-30 ENCOUNTER — Ambulatory Visit (INDEPENDENT_AMBULATORY_CARE_PROVIDER_SITE_OTHER): Payer: Medicare Other

## 2021-08-30 VITALS — BP 136/72 | HR 63 | Temp 97.8°F | Resp 18 | Ht 65.0 in | Wt 127.7 lb

## 2021-08-30 DIAGNOSIS — Z135 Encounter for screening for eye and ear disorders: Secondary | ICD-10-CM

## 2021-08-30 DIAGNOSIS — Z0000 Encounter for general adult medical examination without abnormal findings: Secondary | ICD-10-CM

## 2021-08-30 NOTE — Progress Notes (Signed)
Subjective:   Dylan Villanueva is a 81 y.o. male who presents for Medicare Annual/Subsequent preventive examination.  Review of Systems    Per HPI unless specifically indicated below Cardiac Risk Factors include: advanced age (>24mn, >>58women);dyslipidemia;male gender;hypertension;smoking/ tobacco exposure         Objective:    Today's Vitals   08/30/21 0903  BP: 136/72  Pulse: 63  Resp: 18  Temp: 97.8 F (36.6 C)  TempSrc: Oral  SpO2: 99%  Weight: 127 lb 11.2 oz (57.9 kg)  Height: '5\' 5"'$  (1.651 m)  PainSc: 0-No pain   Body mass index is 21.25 kg/m.     07/06/2021    8:48 PM 08/12/2020    8:37 AM 09/23/2019    9:18 AM 09/13/2019    1:30 PM 08/12/2019   11:29 AM 09/12/2018    7:09 AM 08/10/2018    9:09 AM  Advanced Directives  Does Patient Have a Medical Advance Directive? No Yes Yes Yes Yes Yes Yes  Type of ASocial research officer, governmentLiving will Healthcare Power of [place]Living will [place] will [place]Living will [place]Living will  Does patient want to make changes to medical advance directive?   No - Patient declined   No - Patient declined No - Patient declined  Copy of HRidgelyin Chart?  No - copy requested No - copy requested No - copy requested No - copy requested No - copy requested No - copy requested    Current Medications (verified) Outpatient Encounter Medications as of 08/30/2021  Medication Sig   amLODipine (NORVASC) 10 MG tablet Take 1 tablet (10 mg total) by mouth daily.   atorvastatin (LIPITOR) 40 MG tablet Take 1 tablet (40 mg total) by mouth daily.   cilostazol (PLETAL) 100 MG tablet Take 1 tablet by mouth twice a day   clopidogrel (PLAVIX) 75 MG tablet Take 1 tablet (75 mg total) by mouth daily. TAKE 1 TABLET(75 MG) BY MOUTH DAILY   lisinopril (ZESTRIL) 5 MG tablet Take 1 tablet (5 mg total) by mouth daily.    Multiple Vitamins-Minerals (MULTIVITAMIN ADULT PO) Take 1 tablet by mouth daily.    Vitamin D3 (VITAMIN D) 25 MCG tablet Take 1 tablet by mouth daily.   No facility-administered encounter medications on file as of 08/30/2021.    Allergies (verified) Penicillins and Sulfa antibiotics   History: Past Medical History:  Diagnosis Date   Cancer ([place] 2006   prostate    Colon polyps    Coronary artery disease    Dyspnea    With exertion climbing up hill   Hyperlipidemia    Hypertension    Myocardial infarction ([facility]    Prostate CA ([place]    Smoker    Squamous acanthoma of external ear, right    Past Surgical History:  Procedure Laterality Date   APPENDECTOMY  1980   CARDIAC SURGERY     COLONOSCOPY WITH PROPOFOL N/A 03/02/2018   Procedure: COLONOSCOPY WITH PROPOFOL;  Surgeon: WLucilla Lame MD;  [place]  Service: Endoscopy;  Laterality: N/A;   CORONARY ANGIOPLASTY WITH STENT PLACEMENT     CYSTOSCOPY W/ RETROGRADES Bilateral 09/23/2019   Procedure: CYSTOSCOPY WITH RETROGRADE PYELOGRAM;  Surgeon: BHollice Espy MD;  Location: [facility];  Service: Urology;  Laterality: Bilateral;   CYSTOSCOPY WITH URETHRAL DILATATION N/A 09/23/2019   Procedure: CYSTOSCOPY WITH URETHRAL DILATATION;  Surgeon: BHollice Espy MD;  Location:  [facility];  Service: Urology;  Laterality: N/A;   ESOPHAGOGASTRODUODENOSCOPY N/A 11/07/2017   Procedure: ESOPHAGOGASTRODUODENOSCOPY (EGD);  Surgeon: Jules Husbands, MD;  Location: [facility];  Service: General;  Laterality: N/A;   ESOPHAGOGASTRODUODENOSCOPY (EGD) WITH PROPOFOL  03/02/2018   Procedure: ESOPHAGOGASTRODUODENOSCOPY (EGD) WITH PROPOFOL;  Surgeon: [name], MD;  Location: [place];  Service: Endoscopy;;   EUS N/A 07/13/2017   Procedure: FULL UPPER ENDOSCOPIC ULTRASOUND (EUS) RADIAL;  Surgeon: Jola Schmidt, MD;  Location: [facility];  Service: Endoscopy;  Laterality: N/A;   HIGH DOSE RADIATION IMPLANT INSERTION  2006    LAPAROSCOPIC REMOVAL ABDOMINAL MASS N/A 11/07/2017   Procedure: LAPAROSCOPIC GASTRIC MASS RESECTION;  Surgeon: Jules Husbands, MD;  Location: [facility];  Service: General;  Laterality: N/A;   LOWER EXTREMITY ANGIOGRAPHY Left 07/04/2018   Procedure: LOWER EXTREMITY ANGIOGRAPHY;  Surgeon: Katha Cabal, MD;  Location: [place] [facility];  Service: Cardiovascular;  Laterality: Left;   LOWER EXTREMITY ANGIOGRAPHY Left 08/10/2018   Procedure: LOWER EXTREMITY ANGIOGRAPHY;  Surgeon: Katha Cabal, MD;  Location: [place] [facility];  Service: Cardiovascular;  Laterality: Left;   LOWER EXTREMITY ANGIOGRAPHY Left 09/12/2018   Procedure: LOWER EXTREMITY ANGIOGRAPHY;  Surgeon: Katha Cabal, MD;  Location: [place] [facility];  Service: Cardiovascular;  Laterality: Left;   POLYPECTOMY  03/02/2018   Procedure: POLYPECTOMY;  Surgeon: [name], MD;  Location: [place];  Service: Endoscopy;;   TRANSURETHRAL RESECTION OF BLADDER TUMOR N/A 09/23/2019   Procedure: TRANSURETHRAL RESECTION OF BLADDER TUMOR (TURBT);  Surgeon: Hollice Espy, MD;  Location: [facility];  Service: Urology;  Laterality: N/A;   Family History  Problem Relation Age of Onset   Cancer Mother        pancreatic   Dementia Father    Cancer Sister        breast    Aneurysm Brother    Bladder Cancer Neg Hx    Prostate cancer Neg Hx    Kidney cancer Neg Hx    Social History   Socioeconomic History   Marital status: Married    Spouse name: Dylan Villanueva   Number of children: 2   Years of education: Not on file   Highest education level: Bachelor's degree (e.g., BA, AB, BS)  Occupational History   Occupation: retired   Tobacco Use   Smoking status: Every Day    Packs/day: 1.00    Years: 66.00    Total pack years: 66.00    Types: Cigarettes    Start date: 11/18/1952   Smokeless tobacco: Never   Tobacco comments:         Vaping Use   Vaping Use: Never used  Substance and Sexual Activity   Alcohol use: Yes     Alcohol/week: 1.0 standard drink of alcohol    Types: 1 Cans of beer per week    Comment: occasional   Drug use: Never   Sexual activity: Not Currently  Other Topics Concern   Not on file  Social History Narrative   Not on file   Social Determinants of Health   Financial Resource Strain: Low Risk  (08/30/2021)   Overall Financial Resource Strain (CARDIA)    Difficulty of Paying Living Expenses: Not hard at all  Food Insecurity: No Food Insecurity (08/30/2021)   Hunger Vital Sign    Worried About Running Out of Food in the Last Year: Never true    Ran Out of Food in the Last Year: Never true  Transportation  Needs: No Transportation Needs (08/30/2021)   PRAPARE - Hydrologist (Medical): No    Lack of Transportation (Non-Medical): No  Physical Activity: Inactive (08/30/2021)   Exercise Vital Sign    Days of Exercise per Week: 0 days    Minutes of Exercise per Session: 0 min  Stress: No Stress Concern Present (08/30/2021)   [place]    Feeling of Stress : Not at all  Social Connections: Moderately Isolated (08/30/2021)   Social Connection and Isolation Panel [NHANES]    Frequency of Communication with Friends and Family: Twice a week    Frequency of Social Gatherings with Friends and Family: More than three times a week    Attends Religious Services: Never    Marine scientist or Organizations: No    Attends Music therapist: Never    Marital Status: Married    Tobacco Counseling Ready to quit: No Counseling given: Yes Tobacco comments:       Clinical Intake:  Pre-visit preparation completed: No  Pain : No/denies pain Pain Score: 0-No pain     Nutritional Status: BMI of 19-24  Normal Nutritional Risks: None Diabetes: No  How often do you need to have someone help you when you read instructions, pamphlets, or other written materials from your doctor or  pharmacy?: 1 - Never  Diabetic?no  Interpreter Needed?: No  Information entered by :: Donnie Mesa, [place]   Activities of Daily Living    08/30/2021    9:00 AM  In your present state of health, do you have any difficulty performing the following activities:  Hearing? 1  Vision? 1  Difficulty concentrating or making decisions? 0  Walking or climbing stairs? 0  Dressing or bathing? 0  Doing errands, shopping? 0    Patient Care Team: Juline Patch, MD as PCP - General (Family Medicine) Corey Skains, MD as Consulting Physician (Cardiology) Hollice Espy, MD as Consulting Physician (Urology) Schnier, Dolores Lory, MD (Vascular Surgery) [name], MD as Consulting Physician (Gastroenterology)  Indicate any recent Medical Services you may have received from other than [facility] providers in the past year (date may be approximate).    Pt admitted at [facility] on 07/06/2021 for urinary retention.  Assessment:   This is a routine wellness examination for Dylan Villanueva.  Hearing/Vision screen Hearing Screening - Comments:: Pt c/o mild hearing loss; had hearing evaluation and qualifies for hearing aids; pt planning to follow up for hearing aid options   Vision Screening - Comments:: Past due for eye exam; referral placed                                                     Both eyes: 20/25, Rt eye 20/50, left eye 20/40  Dietary issues and exercise activities discussed: Current Exercise Habits: The patient does not participate in regular exercise at present, Exercise limited by: Other - see comments (vascular conditions)   Current Exercise Habits: The patient does not participate in regular exercise at present, Exercise limited by: None identified   Goals Addressed   None    Depression Screen    08/30/2021    9:05 AM 08/30/2021    9:02 AM 05/25/2021    9:09 AM 11/23/2020    9:40 AM 08/12/2020  8:67 AM 05/18/2020   10:19 AM 11/15/2019    9:00 AM  PHQ 2/9 Scores  PHQ -  2 Score 0 0 0 0 0 0 0  PHQ- 9 Score 0 0 0 0  0 0    Fall Risk    08/30/2021    8:59 AM 11/23/2020    9:40 AM 08/12/2020    8:43 AM 11/15/2019    9:00 AM 08/12/2019   11:30 AM  Fall Risk   Falls in the past year? 0 0 0 0 0  Number falls in past yr: 0 0 0  0  Injury with Fall? 0 0 0  0  Risk for fall due to : No Fall Risks No Fall Risks No Fall Risks  No Fall Risks  Follow up Falls evaluation completed Falls evaluation completed Falls prevention discussed Falls evaluation completed Falls prevention discussed    FALL RISK PREVENTION PERTAINING TO THE HOME:  Any stairs in or around the home? Yes  If so, are there any without handrails? Yes  Home free of loose throw rugs in walkways, pet beds, electrical cords, etc? Yes  Adequate lighting in your home to reduce risk of falls? Yes   ASSISTIVE DEVICES UTILIZED TO PREVENT FALLS:  Life alert? No  Use of a cane, walker or w/c? No  Grab bars in the bathroom? Yes  Shower chair or bench in shower? No  Elevated toilet seat or a handicapped toilet? Yes   TIMED UP AND GO:  Was the test performed? Yes .  Length of time to ambulate 10 feet: 10  sec.   Gait steady and fast without use of assistive device  Cognitive Function:        08/30/2021    9:02 AM 08/12/2019   11:43 AM 08/08/2018   11:52 AM 06/01/2017   11:45 AM  6CIT Screen  What Year? 0 points 0 points 0 points 0 points  What month? 0 points 0 points 0 points 0 points  What time? 0 points 0 points 0 points 0 points  Count back from 20 0 points 0 points 0 points 0 points  Months in reverse 0 points 0 points 0 points 0 points  Repeat phrase 0 points 0 points 0 points 0 points  Total Score 0 points 0 points 0 points 0 points    Immunizations Immunization History  Administered Date(s) Administered   Fluad Quad(high Dose 65+) 11/15/2019, 11/23/2020   Influenza, High Dose Seasonal PF 01/20/2017, 12/13/2017, 11/10/2018   PFIZER Comirnaty(Gray Top)Covid-19 Tri-Sucrose Vaccine  02/28/2019, 03/22/2019   PFIZER(Purple Top)SARS-COV-2 Vaccination 02/28/2019, 03/22/2019, 12/08/2019   Pneumococcal Conjugate-13 05/08/2017   Pneumococcal Polysaccharide-23 05/20/2019   Tdap 05/16/2017   Zoster Recombinat (Shingrix) 05/16/2017, 07/26/2017    TDAP status: Up to date  Flu Vaccine status: Up to date  Pneumococcal vaccine status: Up to date  Covid-19 vaccine status: Completed vaccines  Qualifies for Shingles Vaccine? Yes   Zostavax completed Yes   Shingrix Completed?: Yes  Screening Tests Health Maintenance  Topic Date Due   COVID-19 Vaccine (6 - Booster for Pfizer series) 02/02/2020   INFLUENZA VACCINE  09/14/2021   TETANUS/TDAP  05/17/2027   Pneumonia Vaccine 74+ Years old  Completed   Zoster Vaccines- Shingrix  Completed   HPV VACCINES  Aged Out    Health Maintenance  Health Maintenance Due  Topic Date Due   COVID-19 Vaccine (6 - Booster for [place] series) 02/02/2020    Colorectal cancer screening: Type of screening: Colonoscopy. Completed  03/02/2018. Repeat every 0 years  Lung Cancer Screening: (Low Dose CT Chest recommended if Age 67-80 years, 30 pack-year currently smoking OR have quit w/in 15years.) does not qualify.   Lung Cancer Screening Referral: does not qualify   Additional Screening:  Hepatitis C Screening: does not qualify; Completed does not qualify   Vision Screening: Recommended annual ophthalmology exams for early detection of glaucoma and other disorders of the eye. Is the patient up to date with their annual eye exam?  No  Who is the provider or what is the name of the office in which the patient attends annual eye exams?  If pt is not established with a provider, would they like to be referred to a provider to establish care? Yes .   Dental Screening: Recommended annual dental exams for proper oral hygiene  Community Resource Referral / Chronic Care Management: CRR required this visit?  No   CCM required this visit?  No       Plan:     I have personally reviewed and noted the following in the patient's chart:   Medical and social history Use of alcohol, tobacco or illicit drugs  Current medications and supplements including opioid prescriptions. Patient is not currently taking opioid prescriptions. Functional ability and status Nutritional status Physical activity Advanced directives List of other physicians Hospitalizations, surgeries, and ER visits in previous 12 months Vitals Screenings to include cognitive, depression, and falls Referrals and appointments  In addition, I have reviewed and discussed with patient certain preventive protocols, quality metrics, and best practice recommendations. A written personalized care plan for preventive services as well as general preventive health recommendations were provided to patient.   [name] , Thank you for taking time to come for your Medicare Wellness Visit. I appreciate your ongoing commitment to your health goals. Please review the following plan we discussed and let me know if I can assist you in the future.   These are the goals we discussed:  Goals      DIET - INCREASE WATER INTAKE     Recommend to drink at least 6-8 8oz glasses of water per day.        This is a list of the screening recommended for you and due dates:  Health Maintenance  Topic Date Due   COVID-19 Vaccine (6 - Booster for Pfizer series) 02/02/2020   Flu Shot  09/14/2021   Tetanus Vaccine  05/17/2027   Pneumonia Vaccine  Completed   Zoster (Shingles) Vaccine  Completed   HPV Vaccine  Aged [address] [place]   08/30/2021   Nurse Notes: None

## 2021-08-30 NOTE — Patient Instructions (Signed)
Fall Prevention in the Home, Adult Falls can cause injuries and can happen to people of all ages. There are many things you can do to make your home safe and to help prevent falls. Ask for help when making these changes. What actions can I take to prevent falls? General Instructions Use good lighting in all rooms. Replace any light bulbs that burn out. Turn on the lights in dark areas. Use night-lights. Keep items that you use often in easy-to-reach places. Lower the shelves around your home if needed. Set up your furniture so you have a clear path. Avoid moving your furniture around. Do not have throw rugs or other things on the floor that can make you trip. Avoid walking on wet floors. If any of your floors are uneven, fix them. Add color or contrast paint or tape to clearly mark and help you see: Grab bars or handrails. First and last steps of staircases. Where the edge of each step is. If you use a stepladder: Make sure that it is fully opened. Do not climb a closed stepladder. Make sure the sides of the stepladder are locked in place. Ask someone to hold the stepladder while you use it. Know where your pets are when moving through your home. What can I do in the bathroom?     Keep the floor dry. Clean up any water on the floor right away. Remove soap buildup in the tub or shower. Use nonskid mats or decals on the floor of the tub or shower. Attach bath mats securely with double-sided, nonslip rug tape. If you need to sit down in the shower, use a plastic, nonslip stool. Install grab bars by the toilet and in the tub and shower. Do not use towel bars as grab bars. What can I do in the bedroom? Make sure that you have a light by your bed that is easy to reach. Do not use any sheets or blankets for your bed that hang to the floor. Have a firm chair with side arms that you can use for support when you get dressed. What can I do in the kitchen? Clean up any spills right away. If  you need to reach something above you, use a step stool with a grab bar. Keep electrical cords out of the way. Do not use floor polish or wax that makes floors slippery. What can I do with my stairs? Do not leave any items on the stairs. Make sure that you have a light switch at the top and the bottom of the stairs. Make sure that there are handrails on both sides of the stairs. Fix handrails that are broken or loose. Install nonslip stair treads on all your stairs. Avoid having throw rugs at the top or bottom of the stairs. Choose a carpet that does not hide the edge of the steps on the stairs. Check carpeting to make sure that it is firmly attached to the stairs. Fix carpet that is loose or worn. What can I do on the outside of my home? Use bright outdoor lighting. Fix the edges of walkways and driveways and fix any cracks. Remove anything that might make you trip as you walk through a door, such as a raised step or threshold. Trim any bushes or trees on paths to your home. Check to see if handrails are loose or broken and that both sides of all steps have handrails. Install guardrails along the edges of any raised decks and porches. Clear paths of anything   that can make you trip, such as tools or rocks. Have leaves, snow, or ice cleared regularly. Use sand or salt on paths during winter. Clean up any spills in your garage right away. This includes grease or oil spills. What other actions can I take? Wear shoes that: Have a low heel. Do not wear high heels. Have rubber bottoms. Feel good on your feet and fit well. Are closed at the toe. Do not wear open-toe sandals. Use tools that help you move around if needed. These include: Canes. Walkers. Scooters. Crutches. Review your medicines with your doctor. Some medicines can make you feel dizzy. This can increase your chance of falling. Ask your doctor what else you can do to help prevent falls. Where to find more information Centers  for Disease Control and Prevention, [employer]: http://www.wolf.info/ National Institute on Aging: http://kim-miller.com/ Contact a doctor if: You are afraid of falling at home. You feel weak, drowsy, or dizzy at home. You fall at home. Summary There are many simple things that you can do to make your home safe and to help prevent falls. Ways to make your home safe include removing things that can make you trip and installing grab bars in the bathroom. Ask for help when making these changes in your home. This information is not intended to replace advice given to you by your health care provider. Make sure you discuss any questions you have with your health care provider. Document Revised: 11/02/2020 Document Reviewed: 09/04/2019 Elsevier Patient Education  [place] Maintenance, Male Adopting a healthy lifestyle and getting preventive care are important in promoting health and wellness. Ask your health care provider about: The right schedule for you to have regular tests and exams. Things you can do on your own to prevent diseases and keep yourself healthy. What should I know about diet, weight, and exercise? Eat a healthy diet  Eat a diet that includes plenty of vegetables, fruits, low-fat dairy products, and lean protein. Do not eat a lot of foods that are high in solid fats, added sugars, or sodium. Maintain a healthy weight Body mass index (BMI) is a measurement that can be used to identify possible weight problems. It estimates body fat based on height and weight. Your health care provider can help determine your BMI and help you achieve or maintain a healthy weight. Get regular exercise Get regular exercise. This is one of the most important things you can do for your health. Most adults should: Exercise for at least 150 minutes each week. The exercise should increase your heart rate and make you sweat (moderate-intensity exercise). Do strengthening exercises at least twice a week.  This is in addition to the moderate-intensity exercise. Spend less time sitting. Even light physical activity can be beneficial. Watch cholesterol and blood lipids Have your blood tested for lipids and cholesterol at 83 years of age, then have this test every 5 years. You may need to have your cholesterol levels checked more often if: Your lipid or cholesterol levels are high. You are older than 83 years of age. You are at high risk for heart disease. What should I know about cancer screening? Many types of cancers can be detected early and may often be prevented. Depending on your health history and family history, you may need to have cancer screening at various ages. This may include screening for: Colorectal cancer. Prostate cancer. Skin cancer. Lung cancer. What should I know about heart disease, diabetes, and high blood pressure? Blood  pressure and heart disease High blood pressure causes heart disease and increases the risk of stroke. This is more likely to develop in people who have high blood pressure readings or are overweight. Talk with your health care provider about your target blood pressure readings. Have your blood pressure checked: Every 3-5 years if you are 38-58 years of age. Every year if you are 82 years old or older. If you are between the ages of 77 and 78 and are a current or former smoker, ask your health care provider if you should have a one-time screening for abdominal aortic aneurysm (AAA). Diabetes Have regular diabetes screenings. This checks your fasting blood sugar level. Have the screening done: Once every three years after age 4 if you are at a normal weight and have a low risk for diabetes. More often and at a younger age if you are overweight or have a high risk for diabetes. What should I know about preventing infection? Hepatitis B If you have a higher risk for hepatitis B, you should be screened for this virus. Talk with your health care provider  to find out if you are at risk for hepatitis B infection. Hepatitis C Blood testing is recommended for: Everyone born from [birth year] through 1967. Anyone with known risk factors for hepatitis C. Sexually transmitted infections (STIs) You should be screened each year for STIs, including gonorrhea and chlamydia, if: You are sexually active and are younger than 83 years of age. You are older than 83 years of age and your health care provider tells you that you are at risk for this type of infection. Your sexual activity has changed since you were last screened, and you are at increased risk for chlamydia or gonorrhea. Ask your health care provider if you are at risk. Ask your health care provider about whether you are at high risk for HIV. Your health care provider may recommend a prescription medicine to help prevent HIV infection. If you choose to take medicine to prevent HIV, you should first get tested for HIV. You should then be tested every 3 months for as long as you are taking the medicine. Follow these instructions at home: Alcohol use Do not drink alcohol if your health care provider tells you not to drink. If you drink alcohol: Limit how much you have to 0-2 drinks a day. Know how much alcohol is in your drink. In the [place] one drink equals one 12 oz bottle of beer (355 mL), one 5 oz glass of wine (148 mL), or one 1 oz glass of hard liquor (44 mL). Lifestyle Do not use any products that contain nicotine or tobacco. These products include cigarettes, chewing tobacco, and vaping devices, such as e-cigarettes. If you need help quitting, ask your health care provider. Do not use street drugs. Do not share needles. Ask your health care provider for help if you need support or information about quitting drugs. General instructions Schedule regular health, dental, and eye exams. Stay current with your vaccines. Tell your health care provider if: You often feel depressed. You have ever been  abused or do not feel safe at home. Summary Adopting a healthy lifestyle and getting preventive care are important in promoting health and wellness. Follow your health care provider's instructions about healthy diet, exercising, and getting tested or screened for diseases. Follow your health care provider's instructions on monitoring your cholesterol and blood pressure. This information is not intended to replace advice given to you by your health care  provider. Make sure you discuss any questions you have with your health care provider. Document Revised: 06/22/2020 Document Reviewed: 06/22/2020 Elsevier Patient Education  [place].

## 2021-10-07 DIAGNOSIS — C44222 Squamous cell carcinoma of skin of right ear and external auricular canal: Secondary | ICD-10-CM | POA: Diagnosis not present

## 2021-10-07 DIAGNOSIS — C44329 Squamous cell carcinoma of skin of other parts of face: Secondary | ICD-10-CM | POA: Diagnosis not present

## 2021-10-07 DIAGNOSIS — D485 Neoplasm of uncertain behavior of skin: Secondary | ICD-10-CM | POA: Diagnosis not present

## 2021-10-19 DIAGNOSIS — H35713 Central serous chorioretinopathy, bilateral: Secondary | ICD-10-CM | POA: Diagnosis not present

## 2021-10-25 DIAGNOSIS — L578 Other skin changes due to chronic exposure to nonionizing radiation: Secondary | ICD-10-CM | POA: Diagnosis not present

## 2021-10-25 DIAGNOSIS — L814 Other melanin hyperpigmentation: Secondary | ICD-10-CM | POA: Diagnosis not present

## 2021-10-25 DIAGNOSIS — C44329 Squamous cell carcinoma of skin of other parts of face: Secondary | ICD-10-CM | POA: Diagnosis not present

## 2021-10-25 DIAGNOSIS — C44222 Squamous cell carcinoma of skin of right ear and external auricular canal: Secondary | ICD-10-CM | POA: Diagnosis not present

## 2021-11-17 ENCOUNTER — Other Ambulatory Visit: Payer: Self-pay | Admitting: Family Medicine

## 2021-11-17 DIAGNOSIS — I2510 Atherosclerotic heart disease of native coronary artery without angina pectoris: Secondary | ICD-10-CM

## 2021-11-17 DIAGNOSIS — I10 Essential (primary) hypertension: Secondary | ICD-10-CM

## 2021-11-17 DIAGNOSIS — I739 Peripheral vascular disease, unspecified: Secondary | ICD-10-CM

## 2021-11-17 NOTE — Telephone Encounter (Signed)
Requested Prescriptions  Pending Prescriptions Disp Refills  . amLODipine (NORVASC) 10 MG tablet [Pharmacy Med Name: AMLODIPINE BESYLATE '10MG'$  TABLETS] 90 tablet 0    Sig: TAKE 1 TABLET(10 MG) BY MOUTH DAILY     Cardiovascular: Calcium Channel Blockers 2 Passed - 11/17/2021 10:10 AM      Passed - Last BP in normal range    BP Readings from Last 1 Encounters:  08/30/21 136/72         Passed - Last Heart Rate in normal range    Pulse Readings from Last 1 Encounters:  08/30/21 63         Passed - Valid encounter within last 6 months    Recent Outpatient Visits          5 months ago Essential hypertension   [facility] [place], Deanna C, MD   11 months ago Essential hypertension   [facility] [place], Deanna C, MD   1 year ago Anemia, macrocytic   [facility] [place], Deanna C, MD   1 year ago Essential hypertension   [facility] [place], Deanna C, MD   2 years ago Essential hypertension   [facility] [place], [place], MD      Future Appointments            In 1 week Juline Patch, MD [facility], [facility]

## 2021-11-24 ENCOUNTER — Encounter: Payer: Self-pay | Admitting: Family Medicine

## 2021-11-24 ENCOUNTER — Ambulatory Visit (INDEPENDENT_AMBULATORY_CARE_PROVIDER_SITE_OTHER): Payer: Medicare Other | Admitting: Family Medicine

## 2021-11-24 VITALS — BP 120/64 | HR 72 | Ht 65.5 in | Wt 128.0 lb

## 2021-11-24 DIAGNOSIS — I2510 Atherosclerotic heart disease of native coronary artery without angina pectoris: Secondary | ICD-10-CM | POA: Diagnosis not present

## 2021-11-24 DIAGNOSIS — R739 Hyperglycemia, unspecified: Secondary | ICD-10-CM | POA: Diagnosis not present

## 2021-11-24 DIAGNOSIS — I6523 Occlusion and stenosis of bilateral carotid arteries: Secondary | ICD-10-CM | POA: Diagnosis not present

## 2021-11-24 DIAGNOSIS — Z23 Encounter for immunization: Secondary | ICD-10-CM

## 2021-11-24 DIAGNOSIS — E782 Mixed hyperlipidemia: Secondary | ICD-10-CM | POA: Diagnosis not present

## 2021-11-24 DIAGNOSIS — I10 Essential (primary) hypertension: Secondary | ICD-10-CM

## 2021-11-24 DIAGNOSIS — I739 Peripheral vascular disease, unspecified: Secondary | ICD-10-CM | POA: Diagnosis not present

## 2021-11-24 DIAGNOSIS — I70219 Atherosclerosis of native arteries of extremities with intermittent claudication, unspecified extremity: Secondary | ICD-10-CM

## 2021-11-24 MED ORDER — AMLODIPINE BESYLATE 10 MG PO TABS: ORAL_TABLET | ORAL | 1 refills | Status: DC

## 2021-11-24 MED ORDER — LISINOPRIL 5 MG PO TABS: 5.0000 mg | ORAL_TABLET | Freq: Every day | ORAL | 1 refills | Status: DC

## 2021-11-24 MED ORDER — CLOPIDOGREL BISULFATE 75 MG PO TABS: 75.0000 mg | ORAL_TABLET | Freq: Every day | ORAL | 1 refills | Status: DC

## 2021-11-24 MED ORDER — CILOSTAZOL 100 MG PO TABS: ORAL_TABLET | ORAL | 1 refills | Status: DC

## 2021-11-24 MED ORDER — ATORVASTATIN CALCIUM 40 MG PO TABS: 40.0000 mg | ORAL_TABLET | Freq: Every day | ORAL | 1 refills | Status: DC

## 2021-11-24 NOTE — Progress Notes (Signed)
Date:  11/24/2021   Name:  Dylan Villanueva   DOB:  Jun 19, 1940   MRN:  [id number]   Chief Complaint: Hypertension, Hyperlipidemia, Coronary Artery Disease, Hyperglycemia, and Flu Vaccine  Hypertension This is a chronic problem. The current episode started more than 1 year ago. The problem has been gradually improving since onset. The problem is controlled. Pertinent negatives include no anxiety, blurred vision, chest pain, headaches, neck pain, orthopnea, palpitations, PND or shortness of breath. There are no associated agents to hypertension. Risk factors for coronary artery disease include dyslipidemia and male gender. Past treatments include ACE inhibitors and calcium channel blockers. The current treatment provides moderate improvement. There are no compliance problems.  There is no history of angina, kidney disease, CAD/MI, CVA, heart failure, left ventricular hypertrophy, PVD or retinopathy. There is no history of chronic renal disease.  Hyperlipidemia This is a chronic problem. The current episode started more than 1 year ago. The problem is controlled. Recent lipid tests were reviewed and are normal. He has no history of chronic renal disease, diabetes, hypothyroidism, liver disease, obesity or nephrotic syndrome. Pertinent negatives include no chest pain, focal sensory loss, focal weakness, leg pain, myalgias or shortness of breath. Current antihyperlipidemic treatment includes statins. The current treatment provides moderate improvement of lipids. There are no compliance problems.  Risk factors for coronary artery disease include dyslipidemia and hypertension.  Coronary Artery Disease Presents for follow-up visit. Pertinent negatives include no chest pain, chest pressure, chest tightness, dizziness, leg swelling, palpitations or shortness of breath. Risk factors include hyperlipidemia and hypertension. Risk factors do not include obesity. The symptoms have been stable.  Hyperglycemia This is  a new problem. The current episode started 1 to 4 weeks ago. Pertinent negatives include no abdominal pain, chest pain, chills, coughing, fever, headaches, myalgias, nausea, neck pain, rash or sore throat. Nothing aggravates the symptoms. The treatment provided moderate relief.    Lab Results  Component Value Date   NA 133 (L) 07/06/2021   K 3.6 07/06/2021   CO2 22 07/06/2021   GLUCOSE 133 (H) 07/06/2021   BUN 14 07/06/2021   CREATININE 0.78 07/06/2021   CALCIUM 8.9 07/06/2021   EGFR 86 05/25/2021   GFRNONAA >60 07/06/2021   Lab Results  Component Value Date   CHOL 139 05/25/2021   HDL 77 05/25/2021   LDLCALC 51 05/25/2021   TRIG 51 05/25/2021   CHOLHDL 3.6 05/08/2017   Lab Results  Component Value Date   TSH 1.050 05/08/2017   No results found for: "HGBA1C" Lab Results  Component Value Date   WBC 13.2 (H) 07/06/2021   HGB 13.4 07/06/2021   HCT 38.3 (L) 07/06/2021   MCV 98.2 07/06/2021   PLT 223 07/06/2021   Lab Results  Component Value Date   ALT 27 07/06/2021   AST 30 07/06/2021   ALKPHOS 61 07/06/2021   BILITOT 1.0 07/06/2021   Lab Results  Component Value Date   VD25OH 36.5 05/08/2017     Review of Systems  Constitutional:  Negative for chills, fever and unexpected weight change.  HENT:  Negative for drooling, ear discharge, ear pain and sore throat.   Eyes:  Negative for blurred vision.  Respiratory:  Negative for cough, chest tightness, shortness of breath and wheezing.   Cardiovascular:  Negative for chest pain, palpitations, orthopnea, leg swelling and PND.  Gastrointestinal:  Negative for abdominal pain, blood in stool, constipation, diarrhea and nausea.  Endocrine: Positive for polyuria. Negative for polydipsia.  Genitourinary:  Negative for dysuria, frequency, hematuria and urgency.  Musculoskeletal:  Negative for back pain, myalgias and neck pain.  Skin:  Negative for rash.  Allergic/Immunologic: Negative for environmental allergies.   Neurological:  Negative for dizziness, focal weakness and headaches.  Hematological:  Does not bruise/bleed easily.  Psychiatric/Behavioral:  Negative for suicidal ideas. The patient is not nervous/anxious.     Patient Active Problem List   Diagnosis Date Noted   Current smoker 04/08/2020   Atherosclerosis of native arteries of extremity with intermittent claudication ([place]) 06/25/2018   Current every day smoker 06/25/2018   Benign gastrointestinal stromal tumor (GIST)    History of colonic polyps    Benign neoplasm of transverse colon    Benign neoplasm of ascending colon    Gastric mass    Bilateral carotid artery stenosis 08/30/2017   Benign essential HTN 05/25/2017   Non-ST elevation myocardial infarction (NSTEMI), subendocardial infarction, subsequent episode of care ([place]) 05/25/2017   Chronic venous insufficiency 05/08/2017   Coronary artery disease involving native coronary artery of native heart 05/08/2017   Hyperlipidemia, mixed 05/08/2017   Hypertension 05/08/2017   History of prostate cancer 05/08/2017   Gait instability 05/08/2017   Vitamin D deficiency 05/08/2017   Presence of stent in coronary artery in patient with coronary artery disease 05/08/2017   History of shingles 05/08/2017    Allergies  Allergen Reactions   Penicillins Nausea Only    Has patient had a PCN reaction causing immediate rash, facial/tongue/throat swelling, SOB or lightheadedness with hypotension: no Has patient had a PCN reaction causing severe rash involving mucus membranes or skin necrosis: no Has patient had a PCN reaction that required hospitalization: no Has patient had a PCN reaction occurring within the last 10 years: no If all of the above answers are "NO", then may proceed with Cephalosporin use.    Sulfa Antibiotics Diarrhea    Past Surgical History:  Procedure Laterality Date   APPENDECTOMY  1980   CARDIAC SURGERY     COLONOSCOPY WITH PROPOFOL N/A 03/02/2018   Procedure:  COLONOSCOPY WITH PROPOFOL;  Surgeon: [name], MD;  Location: [facility];  Service: Endoscopy;  Laterality: N/A;   CORONARY ANGIOPLASTY WITH STENT PLACEMENT     CYSTOSCOPY W/ RETROGRADES Bilateral 09/23/2019   Procedure: CYSTOSCOPY WITH RETROGRADE PYELOGRAM;  Surgeon: Hollice Espy, MD;  Location: [facility];  Service: Urology;  Laterality: Bilateral;   CYSTOSCOPY WITH URETHRAL DILATATION N/A 09/23/2019   Procedure: CYSTOSCOPY WITH URETHRAL DILATATION;  Surgeon: Hollice Espy, MD;  Location: [facility];  Service: Urology;  Laterality: N/A;   ESOPHAGOGASTRODUODENOSCOPY N/A 11/07/2017   Procedure: ESOPHAGOGASTRODUODENOSCOPY (EGD);  Surgeon: Jules Husbands, MD;  Location: [facility];  Service: General;  Laterality: N/A;   ESOPHAGOGASTRODUODENOSCOPY (EGD) WITH PROPOFOL  03/02/2018   Procedure: ESOPHAGOGASTRODUODENOSCOPY (EGD) WITH PROPOFOL;  Surgeon: [name], MD;  Location: [facility];  Service: Endoscopy;;   EUS N/A 07/13/2017   Procedure: FULL UPPER ENDOSCOPIC ULTRASOUND (EUS) RADIAL;  Surgeon: Jola Schmidt, MD;  Location: [facility];  Service: Endoscopy;  Laterality: N/A;   HIGH DOSE RADIATION IMPLANT INSERTION  2006   LAPAROSCOPIC REMOVAL ABDOMINAL MASS N/A 11/07/2017   Procedure: LAPAROSCOPIC GASTRIC MASS RESECTION;  Surgeon: Jules Husbands, MD;  Location: [facility];  Service: General;  Laterality: N/A;   LOWER EXTREMITY ANGIOGRAPHY Left 07/04/2018   Procedure: LOWER EXTREMITY ANGIOGRAPHY;  Surgeon: Katha Cabal, MD;  Location: [facility];  Service: Cardiovascular;  Laterality: Left;   LOWER EXTREMITY ANGIOGRAPHY Left 08/10/2018  Procedure: LOWER EXTREMITY ANGIOGRAPHY;  Surgeon: Katha Cabal, MD;  Location: [facility];  Service: Cardiovascular;  Laterality: Left;   LOWER EXTREMITY ANGIOGRAPHY Left 09/12/2018   Procedure: LOWER EXTREMITY ANGIOGRAPHY;  Surgeon: Katha Cabal, MD;  Location: [facility];  Service: Cardiovascular;   Laterality: Left;   POLYPECTOMY  03/02/2018   Procedure: POLYPECTOMY;  Surgeon: [name], MD;  Location: [facility];  Service: Endoscopy;;   TRANSURETHRAL RESECTION OF BLADDER TUMOR N/A 09/23/2019   Procedure: TRANSURETHRAL RESECTION OF BLADDER TUMOR (TURBT);  Surgeon: Hollice Espy, MD;  Location: [facility];  Service: Urology;  Laterality: N/A;    Social History   Tobacco Use   Smoking status: Every Day    Packs/day: 1.00    Years: 66.00    Total pack years: 66.00    Types: Cigarettes    Start date: 11/18/1952   Smokeless tobacco: Never   Tobacco comments:         Vaping Use   Vaping Use: Never used  Substance Use Topics   Alcohol use: Yes    Alcohol/week: 1.0 standard drink of alcohol    Types: 1 Cans of beer per week    Comment: occasional   Drug use: Never     Medication list has been reviewed and updated.  Current Meds  Medication Sig   amLODipine (NORVASC) 10 MG tablet TAKE 1 TABLET(10 MG) BY MOUTH DAILY   atorvastatin (LIPITOR) 40 MG tablet Take 1 tablet (40 mg total) by mouth daily.   cilostazol (PLETAL) 100 MG tablet Take 1 tablet by mouth twice a day   clopidogrel (PLAVIX) 75 MG tablet Take 1 tablet (75 mg total) by mouth daily. TAKE 1 TABLET(75 MG) BY MOUTH DAILY   lisinopril (ZESTRIL) 5 MG tablet Take 1 tablet (5 mg total) by mouth daily.   Multiple Vitamins-Minerals (MULTIVITAMIN ADULT PO) Take 1 tablet by mouth daily.    Vitamin D3 (VITAMIN D) 25 MCG tablet Take 1 tablet by mouth daily.       11/24/2021    9:39 AM 08/30/2021    9:02 AM 05/25/2021    9:09 AM 11/23/2020    9:41 AM  GAD 7 : Generalized Anxiety Score  Nervous, Anxious, on Edge 0 0 0 0  Control/stop worrying 0 0 0 0  Worry too much - different things 0 0 0 0  Trouble relaxing 0 0 0 0  Restless 0 0 0 0  Easily annoyed or irritable 0 0 0 0  Afraid - awful might happen 0 0 0 0  Total GAD 7 Score 0 0 0 0  Anxiety Difficulty Not difficult at all Not difficult at all Not difficult  at all Not difficult at all       11/24/2021    9:38 AM 08/30/2021    9:05 AM 08/30/2021    9:02 AM  Depression screen PHQ 2/9  Decreased Interest 0 0 0  Down, Depressed, Hopeless 0 0 0  PHQ - 2 Score 0 0 0  Altered sleeping 0 0 0  Tired, decreased energy 0 0 0  Change in appetite 0 0 0  Feeling bad or failure about yourself  0 0 0  Trouble concentrating 0  0  Moving slowly or fidgety/restless 0 0 0  Suicidal thoughts 0 0 0  PHQ-9 Score 0 0 0  Difficult doing work/chores Not difficult at all Not difficult at all Not difficult at all    BP Readings from Last  3 Encounters:  11/24/21 120/64  08/30/21 136/72  07/09/21 126/69    Physical Exam Vitals and nursing note reviewed.  HENT:     Head: Normocephalic.     Right Ear: Tympanic membrane and external ear normal.     Left Ear: Tympanic membrane and external ear normal.     Nose: Nose normal.     Mouth/Throat:     Mouth: Mucous membranes are moist.  Eyes:     General: No scleral icterus.       Right eye: No discharge.        Left eye: No discharge.     Conjunctiva/sclera: Conjunctivae normal.     Pupils: Pupils are equal, round, and reactive to light.  Neck:     Thyroid: No thyromegaly.     Vascular: No JVD.     Trachea: No tracheal deviation.  Cardiovascular:     Rate and Rhythm: Normal rate and regular rhythm.     Heart sounds: Normal heart sounds. No murmur heard.    No friction rub. No gallop.  Pulmonary:     Effort: No respiratory distress.     Breath sounds: Normal breath sounds. No wheezing, rhonchi or rales.  Abdominal:     General: Bowel sounds are normal.     Palpations: Abdomen is soft. There is no mass.     Tenderness: There is no abdominal tenderness. There is no guarding or rebound.  Musculoskeletal:        General: No tenderness. Normal range of motion.     Cervical back: Normal range of motion and neck supple.  Lymphadenopathy:     Cervical: No cervical adenopathy.  Skin:    General: Skin is  warm.     Findings: No rash.  Neurological:     Mental Status: He is alert.     Wt Readings from Last 3 Encounters:  11/24/21 128 lb (58.1 kg)  08/30/21 127 lb 11.2 oz (57.9 kg)  07/09/21 130 lb (59 kg)    BP 120/64   Pulse 72   Ht 5' 5.5" (1.664 m)   Wt 128 lb (58.1 kg)   BMI 20.98 kg/m   Assessment and Plan:  1. Essential hypertension Chronic.  Controlled.  Stable.  Asymptomatic.  Tolerating medication well.  Continue amlodipine 10 mg once a day and lisinopril 5 mg once a day. - amLODipine (NORVASC) 10 MG tablet; TAKE 1 TABLET(10 MG) BY MOUTH DAILY  Dispense: 90 tablet; Refill: 1 - lisinopril (ZESTRIL) 5 MG tablet; Take 1 tablet (5 mg total) by mouth daily.  Dispense: 90 tablet; Refill: 1  2. Hyperlipidemia, mixed Chronic.  Controlled.  Stable.  Continue atorvastatin 40 mg once a day.  Will check lipid panel. - atorvastatin (LIPITOR) 40 MG tablet; Take 1 tablet (40 mg total) by mouth daily.  Dispense: 90 tablet; Refill: 1 - Lipid Panel With LDL/HDL Ratio  3. Coronary artery disease involving native coronary artery of native heart without angina pectoris Chronic.  Controlled.  Stable.  Continue Plavix 75 mg once a day. - amLODipine (NORVASC) 10 MG tablet; TAKE 1 TABLET(10 MG) BY MOUTH DAILY  Dispense: 90 tablet; Refill: 1 - clopidogrel (PLAVIX) 75 MG tablet; Take 1 tablet (75 mg total) by mouth daily. TAKE 1 TABLET(75 MG) BY MOUTH DAILY  Dispense: 90 tablet; Refill: 1 - lisinopril (ZESTRIL) 5 MG tablet; Take 1 tablet (5 mg total) by mouth daily.  Dispense: 90 tablet; Refill: 1  4. PAD (peripheral artery disease) ([place]) 90.  Controlled.  Stable.  Continue Pletal 100 mg 1 twice a day.  As well as Plavix 75 mg once a day. - amLODipine (NORVASC) 10 MG tablet; TAKE 1 TABLET(10 MG) BY MOUTH DAILY  Dispense: 90 tablet; Refill: 1 - lisinopril (ZESTRIL) 5 MG tablet; Take 1 tablet (5 mg total) by mouth daily.  Dispense: 90 tablet; Refill: 1  5. Atherosclerotic peripheral vascular  disease with intermittent claudication (HCC) As noted above Pletal and Plavix are to be taken together. - cilostazol (PLETAL) 100 MG tablet; Take 1 tablet by mouth twice a day  Dispense: 180 tablet; Refill: 1 - clopidogrel (PLAVIX) 75 MG tablet; Take 1 tablet (75 mg total) by mouth daily. TAKE 1 TABLET(75 MG) BY MOUTH DAILY  Dispense: 90 tablet; Refill: 1  6. Hyperglycemia Elevated glucose on last renal function panel.  This may not of been fasting but we will repeat CMP with glucose and we will check an A1c to see if patient may be in prediabetic range.  Patient has been given low glycemic guidelines. - HgB A1c  7. Need for immunization against influenza Discussed and administered. - Flu Vaccine QUAD High Dose(Fluad)    Otilio Miu, MD

## 2021-11-24 NOTE — Patient Instructions (Signed)

## 2021-11-25 LAB — LIPID PANEL WITH LDL/HDL RATIO
Cholesterol, Total: 124 mg/dL (ref 100–199)
HDL: 69 mg/dL (ref >39)
LDL Chol Calc (NIH): 42 mg/dL (ref 0–99)
LDL/HDL Ratio: 0.6 ratio (ref 0.0–3.6)
Triglycerides: 57 mg/dL (ref 0–149)
VLDL Cholesterol Cal: 13 mg/dL (ref 5–40)

## 2021-11-25 LAB — HEMOGLOBIN A1C
Est. average glucose Bld gHb Est-mCnc: 120 mg/dL
Hgb A1c MFr Bld: 5.8 % — ABNORMAL HIGH (ref 4.8–5.6)

## 2021-12-03 ENCOUNTER — Other Ambulatory Visit: Payer: Self-pay | Admitting: Family Medicine

## 2021-12-03 DIAGNOSIS — I2510 Atherosclerotic heart disease of native coronary artery without angina pectoris: Secondary | ICD-10-CM

## 2021-12-03 DIAGNOSIS — I70219 Atherosclerosis of native arteries of extremities with intermittent claudication, unspecified extremity: Secondary | ICD-10-CM

## 2021-12-06 ENCOUNTER — Telehealth: Payer: Self-pay | Admitting: *Deleted

## 2021-12-06 NOTE — Telephone Encounter (Signed)
2nd call to pharmacy to verfiy medication refill. Pharmacy tech reports system is still down and unable to answer questions regarding refills.

## 2021-12-06 NOTE — Telephone Encounter (Signed)
Called patient to verify last dispensed medications. Patient reports he has 3 month supply . No refill needed at this time.

## 2021-12-06 NOTE — Telephone Encounter (Signed)
Requested by interface surescripts. Patient reports he already has 90 day supply . No refill needed.  Requested Prescriptions  Refused Prescriptions Disp Refills  . clopidogrel (PLAVIX) 75 MG tablet [Pharmacy Med Name: CLOPIDOGREL '75MG'$  TABLETS] 90 tablet 1    Sig: TAKE 1 TABLET(75 MG) BY MOUTH DAILY     Hematology: Antiplatelets - clopidogrel Failed - 12/03/2021  6:03 PM      Failed - HCT in normal range and within 180 days    HCT  Date Value Ref Range Status  07/06/2021 38.3 (L) 39.0 - 52.0 % Final   Hematocrit  Date Value Ref Range Status  05/25/2021 40.7 37.5 - 51.0 % Final         Passed - HGB in normal range and within 180 days    Hemoglobin  Date Value Ref Range Status  07/06/2021 13.4 13.0 - 17.0 g/dL Final  05/25/2021 13.8 13.0 - 17.7 g/dL Final         Passed - PLT in normal range and within 180 days    Platelets  Date Value Ref Range Status  07/06/2021 223 150 - 400 K/uL Final  05/25/2021 238 150 - 450 x10E3/uL Final         Passed - Cr in normal range and within 360 days    Creatinine, Ser  Date Value Ref Range Status  07/06/2021 0.78 0.61 - 1.24 mg/dL Final         Passed - Valid encounter within last 6 months    Recent Outpatient Visits          1 week ago Essential hypertension   [facility] [place], Deanna C, MD   6 months ago Essential hypertension   [facility] [place], Deanna C, MD   1 year ago Essential hypertension   [facility] [place], Deanna C, MD   1 year ago Anemia, macrocytic   [facility] [place], Deanna C, MD   1 year ago Essential hypertension   [facility] [place], [place], MD      Future Appointments            In 5 months Juline Patch, MD [facility] at [facility], [facility]

## 2021-12-06 NOTE — Telephone Encounter (Signed)
Called pharmacy to verify if patient has picked up Rx and pharmacy tech reports systems are currently down and can not pull up patient information to verify medication status. Requesting to call back.

## 2021-12-06 NOTE — Telephone Encounter (Signed)
Pt given lab results per notes of Dr. Ronnald Ramp from 11/25/21 on 12/06/21. Pt verbalized understanding A1c is in prediabetic range to limit concentrated sweets and carbohydrates recheck A1c with dietary approach in 4 months.

## 2021-12-08 DIAGNOSIS — L578 Other skin changes due to chronic exposure to nonionizing radiation: Secondary | ICD-10-CM | POA: Diagnosis not present

## 2021-12-08 DIAGNOSIS — L821 Other seborrheic keratosis: Secondary | ICD-10-CM | POA: Diagnosis not present

## 2021-12-08 DIAGNOSIS — Z859 Personal history of malignant neoplasm, unspecified: Secondary | ICD-10-CM | POA: Diagnosis not present

## 2021-12-08 DIAGNOSIS — D225 Melanocytic nevi of trunk: Secondary | ICD-10-CM | POA: Diagnosis not present

## 2021-12-15 DIAGNOSIS — I10 Essential (primary) hypertension: Secondary | ICD-10-CM | POA: Diagnosis not present

## 2021-12-15 DIAGNOSIS — E782 Mixed hyperlipidemia: Secondary | ICD-10-CM | POA: Diagnosis not present

## 2021-12-15 DIAGNOSIS — F17200 Nicotine dependence, unspecified, uncomplicated: Secondary | ICD-10-CM | POA: Diagnosis not present

## 2021-12-15 DIAGNOSIS — I70219 Atherosclerosis of native arteries of extremities with intermittent claudication, unspecified extremity: Secondary | ICD-10-CM | POA: Diagnosis not present

## 2021-12-15 DIAGNOSIS — I2510 Atherosclerotic heart disease of native coronary artery without angina pectoris: Secondary | ICD-10-CM | POA: Diagnosis not present

## 2021-12-15 DIAGNOSIS — I6523 Occlusion and stenosis of bilateral carotid arteries: Secondary | ICD-10-CM | POA: Diagnosis not present

## 2022-03-17 ENCOUNTER — Other Ambulatory Visit: Payer: Self-pay

## 2022-03-17 DIAGNOSIS — I2510 Atherosclerotic heart disease of native coronary artery without angina pectoris: Secondary | ICD-10-CM

## 2022-03-17 DIAGNOSIS — I10 Essential (primary) hypertension: Secondary | ICD-10-CM

## 2022-03-17 DIAGNOSIS — I739 Peripheral vascular disease, unspecified: Secondary | ICD-10-CM

## 2022-03-17 MED ORDER — AMLODIPINE BESYLATE 10 MG PO TABS: ORAL_TABLET | ORAL | 0 refills | Status: DC

## 2022-04-14 ENCOUNTER — Other Ambulatory Visit: Payer: Self-pay

## 2022-04-14 DIAGNOSIS — I10 Essential (primary) hypertension: Secondary | ICD-10-CM

## 2022-04-14 DIAGNOSIS — I2510 Atherosclerotic heart disease of native coronary artery without angina pectoris: Secondary | ICD-10-CM

## 2022-04-14 DIAGNOSIS — I739 Peripheral vascular disease, unspecified: Secondary | ICD-10-CM

## 2022-04-14 MED ORDER — LISINOPRIL 5 MG PO TABS: 5.0000 mg | ORAL_TABLET | Freq: Every day | ORAL | 0 refills | Status: DC

## 2022-05-26 ENCOUNTER — Encounter: Payer: Self-pay | Admitting: Family Medicine

## 2022-05-26 ENCOUNTER — Ambulatory Visit (INDEPENDENT_AMBULATORY_CARE_PROVIDER_SITE_OTHER): Payer: Medicare Other | Admitting: Family Medicine

## 2022-05-26 VITALS — BP 122/62 | HR 74 | Ht 65.5 in | Wt 129.0 lb

## 2022-05-26 DIAGNOSIS — R7303 Prediabetes: Secondary | ICD-10-CM

## 2022-05-26 DIAGNOSIS — I10 Essential (primary) hypertension: Secondary | ICD-10-CM

## 2022-05-26 DIAGNOSIS — F17200 Nicotine dependence, unspecified, uncomplicated: Secondary | ICD-10-CM

## 2022-05-26 DIAGNOSIS — R710 Precipitous drop in hematocrit: Secondary | ICD-10-CM | POA: Diagnosis not present

## 2022-05-26 DIAGNOSIS — I739 Peripheral vascular disease, unspecified: Secondary | ICD-10-CM

## 2022-05-26 DIAGNOSIS — I2510 Atherosclerotic heart disease of native coronary artery without angina pectoris: Secondary | ICD-10-CM

## 2022-05-26 DIAGNOSIS — E782 Mixed hyperlipidemia: Secondary | ICD-10-CM

## 2022-05-26 DIAGNOSIS — I70219 Atherosclerosis of native arteries of extremities with intermittent claudication, unspecified extremity: Secondary | ICD-10-CM

## 2022-05-26 MED ORDER — CLOPIDOGREL BISULFATE 75 MG PO TABS: 75.0000 mg | ORAL_TABLET | Freq: Every day | ORAL | 1 refills | Status: DC

## 2022-05-26 MED ORDER — LISINOPRIL 5 MG PO TABS: 5.0000 mg | ORAL_TABLET | Freq: Every day | ORAL | 1 refills | Status: DC

## 2022-05-26 MED ORDER — AMLODIPINE BESYLATE 10 MG PO TABS: ORAL_TABLET | ORAL | 1 refills | Status: DC

## 2022-05-26 MED ORDER — ATORVASTATIN CALCIUM 40 MG PO TABS: 40.0000 mg | ORAL_TABLET | Freq: Every day | ORAL | 1 refills | Status: DC

## 2022-05-26 MED ORDER — CILOSTAZOL 100 MG PO TABS: ORAL_TABLET | ORAL | 1 refills | Status: DC

## 2022-05-26 NOTE — Progress Notes (Signed)
Date:  05/26/2022   Name:  Dylan Villanueva   DOB:  November 02, 1940   MRN:  [id number]   Chief Complaint: Prediabetes, Hypertension, Hyperlipidemia, and PAD  Hypertension This is a chronic problem. The current episode started more than 1 year ago. The problem has been gradually improving since onset. The problem is controlled. Pertinent negatives include no chest pain, headaches, orthopnea, palpitations, shortness of breath or sweats. There are no associated agents to hypertension. Risk factors for coronary artery disease include dyslipidemia and smoking/tobacco exposure. Past treatments include ACE inhibitors and calcium channel blockers. The current treatment provides moderate improvement. There are no compliance problems.  Hypertensive end-organ damage includes CAD/MI and PVD. There is no history of CVA, heart failure or left ventricular hypertrophy. There is no history of chronic renal disease, a hypertension causing med or renovascular disease.  Hyperlipidemia This is a chronic problem. The current episode started more than 1 year ago. The problem is controlled. He has no history of chronic renal disease. prediabetes. Pertinent negatives include no chest pain or shortness of breath. Current antihyperlipidemic treatment includes statins. There are no compliance problems.  Risk factors for coronary artery disease include dyslipidemia and hypertension.    Lab Results  Component Value Date   NA 133 (L) 07/06/2021   K 3.6 07/06/2021   CO2 22 07/06/2021   GLUCOSE 133 (H) 07/06/2021   BUN 14 07/06/2021   CREATININE 0.78 07/06/2021   CALCIUM 8.9 07/06/2021   EGFR 86 05/25/2021   GFRNONAA >60 07/06/2021   Lab Results  Component Value Date   CHOL 124 11/24/2021   HDL 69 11/24/2021   LDLCALC 42 11/24/2021   TRIG 57 11/24/2021   CHOLHDL 3.6 05/08/2017   Lab Results  Component Value Date   TSH 1.050 05/08/2017   Lab Results  Component Value Date   HGBA1C 5.8 (H) 11/24/2021   Lab Results   Component Value Date   WBC 13.2 (H) 07/06/2021   HGB 13.4 07/06/2021   HCT 38.3 (L) 07/06/2021   MCV 98.2 07/06/2021   PLT 223 07/06/2021   Lab Results  Component Value Date   ALT 27 07/06/2021   AST 30 07/06/2021   ALKPHOS 61 07/06/2021   BILITOT 1.0 07/06/2021   Lab Results  Component Value Date   VD25OH 36.5 05/08/2017     Review of Systems  Constitutional:  Negative for chills and fever.  HENT:  Negative for sore throat and trouble swallowing.   Eyes:  Negative for visual disturbance.  Respiratory:  Negative for chest tightness, shortness of breath and wheezing.   Cardiovascular:  Negative for chest pain, palpitations, orthopnea and leg swelling.  Gastrointestinal:  Negative for abdominal pain and blood in stool.  Endocrine: Negative for polydipsia and polyuria.  Genitourinary:  Negative for decreased urine volume, difficulty urinating and urgency.  Neurological:  Negative for headaches.  Hematological:  Negative for adenopathy. Does not bruise/bleed easily.    Patient Active Problem List   Diagnosis Date Noted   Current smoker 04/08/2020   Atherosclerosis of native arteries of extremity with intermittent claudication 06/25/2018   Current every day smoker 06/25/2018   Benign gastrointestinal stromal tumor (GIST)    History of colonic polyps    Benign neoplasm of transverse colon    Benign neoplasm of ascending colon    Gastric mass    Bilateral carotid artery stenosis 08/30/2017   Benign essential HTN 05/25/2017   Non-ST elevation myocardial infarction (NSTEMI), subendocardial infarction, subsequent episode of care  05/25/2017   Chronic venous insufficiency 05/08/2017   Coronary artery disease involving native coronary artery of native heart 05/08/2017   Hyperlipidemia, mixed 05/08/2017   Hypertension 05/08/2017   History of prostate cancer 05/08/2017   Gait instability 05/08/2017   Vitamin D deficiency 05/08/2017   Presence of stent in coronary artery in  patient with coronary artery disease 05/08/2017   History of shingles 05/08/2017    Allergies  Allergen Reactions   Penicillins Nausea Only    Has patient had a PCN reaction causing immediate rash, facial/tongue/throat swelling, SOB or lightheadedness with hypotension: no Has patient had a PCN reaction causing severe rash involving mucus membranes or skin necrosis: no Has patient had a PCN reaction that required hospitalization: no Has patient had a PCN reaction occurring within the last 10 years: no If all of the above answers are "NO", then may proceed with Cephalosporin use.    Sulfa Antibiotics Diarrhea    Past Surgical History:  Procedure Laterality Date   APPENDECTOMY  1980   CARDIAC SURGERY     COLONOSCOPY WITH PROPOFOL N/A 03/02/2018   Procedure: COLONOSCOPY WITH PROPOFOL;  Surgeon: Midge Minium, MD;  Location: [facility];  Service: Endoscopy;  Laterality: N/A;   CORONARY ANGIOPLASTY WITH STENT PLACEMENT     CYSTOSCOPY W/ RETROGRADES Bilateral 09/23/2019   Procedure: CYSTOSCOPY WITH RETROGRADE PYELOGRAM;  Surgeon: Vanna Scotland, MD;  Location: [facility];  Service: Urology;  Laterality: Bilateral;   CYSTOSCOPY WITH URETHRAL DILATATION N/A 09/23/2019   Procedure: CYSTOSCOPY WITH URETHRAL DILATATION;  Surgeon: Vanna Scotland, MD;  Location: [facility];  Service: Urology;  Laterality: N/A;   ESOPHAGOGASTRODUODENOSCOPY N/A 11/07/2017   Procedure: ESOPHAGOGASTRODUODENOSCOPY (EGD);  Surgeon: Leafy Ro, MD;  Location: [facility];  Service: General;  Laterality: N/A;   ESOPHAGOGASTRODUODENOSCOPY (EGD) WITH PROPOFOL  03/02/2018   Procedure: ESOPHAGOGASTRODUODENOSCOPY (EGD) WITH PROPOFOL;  Surgeon: Midge Minium, MD;  Location: [facility];  Service: Endoscopy;;   EUS N/A 07/13/2017   Procedure: FULL UPPER ENDOSCOPIC ULTRASOUND (EUS) RADIAL;  Surgeon: Rayann Heman, MD;  Location: [facility];  Service: Endoscopy;  Laterality: N/A;   HIGH DOSE RADIATION IMPLANT INSERTION   2006   LAPAROSCOPIC REMOVAL ABDOMINAL MASS N/A 11/07/2017   Procedure: LAPAROSCOPIC GASTRIC MASS RESECTION;  Surgeon: Leafy Ro, MD;  Location: [facility];  Service: General;  Laterality: N/A;   LOWER EXTREMITY ANGIOGRAPHY Left 07/04/2018   Procedure: LOWER EXTREMITY ANGIOGRAPHY;  Surgeon: Renford Dills, MD;  Location: [facility];  Service: Cardiovascular;  Laterality: Left;   LOWER EXTREMITY ANGIOGRAPHY Left 08/10/2018   Procedure: LOWER EXTREMITY ANGIOGRAPHY;  Surgeon: Renford Dills, MD;  Location: [facility];  Service: Cardiovascular;  Laterality: Left;   LOWER EXTREMITY ANGIOGRAPHY Left 09/12/2018   Procedure: LOWER EXTREMITY ANGIOGRAPHY;  Surgeon: Renford Dills, MD;  Location: [facility];  Service: Cardiovascular;  Laterality: Left;   POLYPECTOMY  03/02/2018   Procedure: POLYPECTOMY;  Surgeon: Midge Minium, MD;  Location: [facility];  Service: Endoscopy;;   TRANSURETHRAL RESECTION OF BLADDER TUMOR N/A 09/23/2019   Procedure: TRANSURETHRAL RESECTION OF BLADDER TUMOR (TURBT);  Surgeon: Vanna Scotland, MD;  Location: [facility];  Service: Urology;  Laterality: N/A;    Social History   Tobacco Use   Smoking status: Every Day    Packs/day: 1.00    Years: 66.00    Additional pack years: 0.00    Total pack years: 66.00    Types: Cigarettes    Start date: 11/18/1952  Smokeless tobacco: Never   Tobacco comments:         Vaping Use   Vaping Use: Never used  Substance Use Topics   Alcohol use: Yes    Alcohol/week: 1.0 standard drink of alcohol    Types: 1 Cans of beer per week    Comment: occasional   Drug use: Never     Medication list has been reviewed and updated.  Current Meds  Medication Sig   amLODipine (NORVASC) 10 MG tablet TAKE 1 TABLET(10 MG) BY MOUTH DAILY   atorvastatin (LIPITOR) 40 MG tablet Take 1 tablet (40 mg total) by mouth daily.   cilostazol (PLETAL) 100 MG tablet Take 1 tablet by mouth twice a day    clopidogrel (PLAVIX) 75 MG tablet Take 1 tablet (75 mg total) by mouth daily. TAKE 1 TABLET(75 MG) BY MOUTH DAILY   lisinopril (ZESTRIL) 5 MG tablet Take 1 tablet (5 mg total) by mouth daily.   Multiple Vitamins-Minerals (MULTIVITAMIN ADULT PO) Take 1 tablet by mouth daily.    Vitamin D3 (VITAMIN D) 25 MCG tablet Take 1 tablet by mouth daily.       05/26/2022    9:49 AM 11/24/2021    9:39 AM 08/30/2021    9:02 AM 05/25/2021    9:09 AM  GAD 7 : Generalized Anxiety Score  Nervous, Anxious, on Edge 0 0 0 0  Control/stop worrying 0 0 0 0  Worry too much - different things 0 0 0 0  Trouble relaxing 0 0 0 0  Restless 0 0 0 0  Easily annoyed or irritable 0 0 0 0  Afraid - awful might happen 0 0 0 0  Total GAD 7 Score 0 0 0 0  Anxiety Difficulty Not difficult at all Not difficult at all Not difficult at all Not difficult at all       05/26/2022    9:49 AM 11/24/2021    9:38 AM 08/30/2021    9:05 AM  Depression screen PHQ 2/9  Decreased Interest 0 0 0  Down, Depressed, Hopeless 0 0 0  PHQ - 2 Score 0 0 0  Altered sleeping 0 0 0  Tired, decreased energy 0 0 0  Change in appetite 0 0 0  Feeling bad or failure about yourself  0 0 0  Trouble concentrating 0 0   Moving slowly or fidgety/restless 0 0 0  Suicidal thoughts 0 0 0  PHQ-9 Score 0 0 0  Difficult doing work/chores Not difficult at all Not difficult at all Not difficult at all    BP Readings from Last 3 Encounters:  05/26/22 122/62  11/24/21 120/64  08/30/21 136/72    Physical Exam Vitals and nursing note reviewed.  HENT:     Head: Normocephalic.     Right Ear: Tympanic membrane and external ear normal.     Left Ear: Tympanic membrane and external ear normal.     Nose: Nose normal.     Mouth/Throat:     Mouth: Mucous membranes are moist.  Eyes:     General: No scleral icterus.       Right eye: No discharge.        Left eye: No discharge.     Conjunctiva/sclera: Conjunctivae normal.     Pupils: Pupils are equal,  round, and reactive to light.  Neck:     Thyroid: No thyromegaly.     Vascular: No JVD.     Trachea: No tracheal deviation.  Cardiovascular:  Rate and Rhythm: Normal rate and regular rhythm.     Heart sounds: Normal heart sounds. No murmur heard.    No friction rub. No gallop.  Pulmonary:     Effort: No respiratory distress.     Breath sounds: Normal breath sounds. No wheezing, rhonchi or rales.  Abdominal:     General: Bowel sounds are normal.     Palpations: Abdomen is soft. There is no mass.     Tenderness: There is no abdominal tenderness. There is no guarding or rebound.  Musculoskeletal:        General: No tenderness. Normal range of motion.     Cervical back: Normal range of motion and neck supple.  Lymphadenopathy:     Cervical: No cervical adenopathy.  Skin:    General: Skin is warm.     Findings: No rash.  Neurological:     Mental Status: He is alert and oriented to person, place, and time.     Cranial Nerves: No cranial nerve deficit.     Deep Tendon Reflexes: Reflexes are normal and symmetric.     Wt Readings from Last 3 Encounters:  05/26/22 129 lb (58.5 kg)  11/24/21 128 lb (58.1 kg)  08/30/21 127 lb 11.2 oz (57.9 kg)    BP 122/62   Pulse 74   Ht 5' 5.5" (1.664 m)   Wt 129 lb (58.5 kg)   SpO2 98%   BMI 21.14 kg/m   Assessment and Plan: 1. PAD (peripheral artery disease) Chronic.  Controlled.  Stable.  Asymptomatic.  Continue amlodipine 10 mg once a day, lisinopril 5 mg once a day, along with Pletal and Plavix.  Will continue monitoring CBC for increased hemoglobin. - amLODipine (NORVASC) 10 MG tablet; TAKE 1 TABLET(10 MG) BY MOUTH DAILY  Dispense: 90 tablet; Refill: 1 - lisinopril (ZESTRIL) 5 MG tablet; Take 1 tablet (5 mg total) by mouth daily.  Dispense: 90 tablet; Refill: 1 - CBC w/Diff/Platelet  2. Essential hypertension Chronic.  Controlled.  Stable.  Blood pressure 122/62.  Continue combination of amlodipine 10 mg once a day and lisinopril 5  mg once a day.  Will check renal function panel for electrolytes and GFR. - amLODipine (NORVASC) 10 MG tablet; TAKE 1 TABLET(10 MG) BY MOUTH DAILY  Dispense: 90 tablet; Refill: 1 - lisinopril (ZESTRIL) 5 MG tablet; Take 1 tablet (5 mg total) by mouth daily.  Dispense: 90 tablet; Refill: 1 - Renal Function Panel  3. Coronary artery disease involving native coronary artery of native heart without angina pectoris .  Controlled.  Stable.  Patient with stent in heart and is asymptomatic for coronary artery disease presently.  Continue amlodipine Plavix and lisinopril. - amLODipine (NORVASC) 10 MG tablet; TAKE 1 TABLET(10 MG) BY MOUTH DAILY  Dispense: 90 tablet; Refill: 1 - clopidogrel (PLAVIX) 75 MG tablet; Take 1 tablet (75 mg total) by mouth daily. TAKE 1 TABLET(75 MG) BY MOUTH DAILY  Dispense: 90 tablet; Refill: 1 - lisinopril (ZESTRIL) 5 MG tablet; Take 1 tablet (5 mg total) by mouth daily.  Dispense: 90 tablet; Refill: 1  4. Hyperlipidemia, mixed .  Controlled.  Stable.  Continue atorvastatin 40 mg once a day. - atorvastatin (LIPITOR) 40 MG tablet; Take 1 tablet (40 mg total) by mouth daily.  Dispense: 90 tablet; Refill: 1  5. Atherosclerotic peripheral vascular disease with intermittent claudication Chronic.  Controlled.  Stable.  Continue Pletal 100 mg twice a day and Plavix 1 tablet daily. - cilostazol (PLETAL) 100 MG tablet; Take  1 tablet by mouth twice a day  Dispense: 180 tablet; Refill: 1 - clopidogrel (PLAVIX) 75 MG tablet; Take 1 tablet (75 mg total) by mouth daily. TAKE 1 TABLET(75 MG) BY MOUTH DAILY  Dispense: 90 tablet; Refill: 1  6. Prediabetes .  Controlled.  Stable.  A1c was noted to be 5.8.  Patient has been given low-cholesterol low triglyceride guidelines along with monitoring glycemic foods intake.  Will check A1c for current status. - HgB A1c  7. Current smoker Patient has been advised of the health risks of smoking and counseled concerning cessation of tobacco products.  I spent over 3 minutes for discussion and to answer questions.   8. Decreased hemoglobin Patient with a history of decreased hemoglobin and we will continue to monitor with CBC as well as monitor for polycythemia as well due to chronic smoking behavior. - CBC w/Diff/Platelet     Elizabeth Sauer, MD

## 2022-05-26 NOTE — Patient Instructions (Signed)

## 2022-05-27 LAB — CBC WITH DIFFERENTIAL/PLATELET
Basophils Absolute: 0.1 10*3/uL (ref 0.0–0.2)
Basos: 1 %
EOS (ABSOLUTE): 0.3 10*3/uL (ref 0.0–0.4)
Eos: 4 %
Hematocrit: 38.3 % (ref 37.5–51.0)
Hemoglobin: 13.6 g/dL (ref 13.0–17.7)
Immature Grans (Abs): 0 10*3/uL (ref 0.0–0.1)
Immature Granulocytes: 0 %
Lymphocytes Absolute: 1.8 10*3/uL (ref 0.7–3.1)
Lymphs: 25 %
MCH: 34.7 pg — ABNORMAL HIGH (ref 26.6–33.0)
MCHC: 35.5 g/dL (ref 31.5–35.7)
MCV: 98 fL — ABNORMAL HIGH (ref 79–97)
Monocytes Absolute: 0.6 10*3/uL (ref 0.1–0.9)
Monocytes: 9 %
Neutrophils Absolute: 4.5 10*3/uL (ref 1.4–7.0)
Neutrophils: 61 %
Platelets: 225 10*3/uL (ref 150–450)
RBC: 3.92 x10E6/uL — ABNORMAL LOW (ref 4.14–5.80)
RDW: 11.9 % (ref 11.6–15.4)
WBC: 7.3 10*3/uL (ref 3.4–10.8)

## 2022-05-27 LAB — RENAL FUNCTION PANEL
Albumin: 4.6 g/dL (ref 3.7–4.7)
BUN/Creatinine Ratio: 15 (ref 10–24)
BUN: 13 mg/dL (ref 8–27)
CO2: 23 mmol/L (ref 20–29)
Calcium: 9.7 mg/dL (ref 8.6–10.2)
Chloride: 101 mmol/L (ref 96–106)
Creatinine, Ser: 0.85 mg/dL (ref 0.76–1.27)
Glucose: 101 mg/dL — ABNORMAL HIGH (ref 70–99)
Phosphorus: 4.2 mg/dL — ABNORMAL HIGH (ref 2.8–4.1)
Potassium: 4.7 mmol/L (ref 3.5–5.2)
Sodium: 142 mmol/L (ref 134–144)
eGFR: 86 mL/min/{1.73_m2} (ref >59)

## 2022-05-27 LAB — HEMOGLOBIN A1C
Est. average glucose Bld gHb Est-mCnc: 126 mg/dL
Hgb A1c MFr Bld: 6 % — ABNORMAL HIGH (ref 4.8–5.6)

## 2022-06-03 ENCOUNTER — Other Ambulatory Visit: Payer: Self-pay | Admitting: Family Medicine

## 2022-06-03 DIAGNOSIS — I70219 Atherosclerosis of native arteries of extremities with intermittent claudication, unspecified extremity: Secondary | ICD-10-CM

## 2022-06-03 DIAGNOSIS — I2510 Atherosclerotic heart disease of native coronary artery without angina pectoris: Secondary | ICD-10-CM

## 2022-06-03 NOTE — Telephone Encounter (Signed)
Refilled 05/26/22 #90 with 1 refill. Requested Prescriptions  Refused Prescriptions Disp Refills   clopidogrel (PLAVIX) 75 MG tablet [Pharmacy Med Name: CLOPIDOGREL  TABLETS] 90 tablet 1    Sig: TAKE 1 TABLET(75 MG) BY MOUTH DAILY     Hematology: Antiplatelets - clopidogrel Passed - 06/03/2022  8:50 AM      Passed - HCT in normal range and within 180 days    Hematocrit  Date Value Ref Range Status  05/26/2022 38.3 37.5 - 51.0 % Final         Passed - HGB in normal range and within 180 days    Hemoglobin  Date Value Ref Range Status  05/26/2022 13.6 13.0 - 17.7 g/dL Final         Passed - PLT in normal range and within 180 days    Platelets  Date Value Ref Range Status  05/26/2022 225 150 - 450 x10E3/uL Final         Passed - Cr in normal range and within 360 days    Creatinine, Ser  Date Value Ref Range Status  05/26/2022 0.85 0.76 - 1.27 mg/dL Final         Passed - Valid encounter within last 6 months    Recent Outpatient Visits           1 week ago PAD (peripheral artery disease)   [facility]   6 months ago Essential hypertension   [place]   1 year ago Essential hypertension   [place] [facility]   1 year ago Essential hypertension   [place] [facility]   1 year ago Anemia, macrocytic   [place] [facility], [facility]       Future Appointments             In 5 months Duanne Limerick, MD [facility]

## 2022-06-10 DIAGNOSIS — L578 Other skin changes due to chronic exposure to nonionizing radiation: Secondary | ICD-10-CM | POA: Diagnosis not present

## 2022-06-10 DIAGNOSIS — L298 Other pruritus: Secondary | ICD-10-CM | POA: Diagnosis not present

## 2022-06-10 DIAGNOSIS — L570 Actinic keratosis: Secondary | ICD-10-CM | POA: Diagnosis not present

## 2022-06-10 DIAGNOSIS — D485 Neoplasm of uncertain behavior of skin: Secondary | ICD-10-CM | POA: Diagnosis not present

## 2022-06-10 DIAGNOSIS — L814 Other melanin hyperpigmentation: Secondary | ICD-10-CM | POA: Diagnosis not present

## 2022-06-10 DIAGNOSIS — L821 Other seborrheic keratosis: Secondary | ICD-10-CM | POA: Diagnosis not present

## 2022-06-10 DIAGNOSIS — D0439 Carcinoma in situ of skin of other parts of face: Secondary | ICD-10-CM | POA: Diagnosis not present

## 2022-06-10 DIAGNOSIS — Z859 Personal history of malignant neoplasm, unspecified: Secondary | ICD-10-CM | POA: Diagnosis not present

## 2022-06-27 ENCOUNTER — Encounter (INDEPENDENT_AMBULATORY_CARE_PROVIDER_SITE_OTHER): Payer: Self-pay | Admitting: Vascular Surgery

## 2022-06-27 ENCOUNTER — Ambulatory Visit (INDEPENDENT_AMBULATORY_CARE_PROVIDER_SITE_OTHER): Payer: Medicare Other | Admitting: Vascular Surgery

## 2022-06-27 ENCOUNTER — Ambulatory Visit (INDEPENDENT_AMBULATORY_CARE_PROVIDER_SITE_OTHER): Payer: Medicare Other

## 2022-06-27 VITALS — BP 128/66 | HR 66 | Resp 16 | Ht 65.0 in | Wt 126.2 lb

## 2022-06-27 DIAGNOSIS — E782 Mixed hyperlipidemia: Secondary | ICD-10-CM | POA: Diagnosis not present

## 2022-06-27 DIAGNOSIS — I872 Venous insufficiency (chronic) (peripheral): Secondary | ICD-10-CM

## 2022-06-27 DIAGNOSIS — I70213 Atherosclerosis of native arteries of extremities with intermittent claudication, bilateral legs: Secondary | ICD-10-CM | POA: Diagnosis not present

## 2022-06-27 DIAGNOSIS — I10 Essential (primary) hypertension: Secondary | ICD-10-CM

## 2022-06-27 DIAGNOSIS — I2510 Atherosclerotic heart disease of native coronary artery without angina pectoris: Secondary | ICD-10-CM | POA: Diagnosis not present

## 2022-06-27 NOTE — Progress Notes (Signed)
MRN : [id number]  [place] Huttner is a 82 y.o. (29-May-1940) male who presents with chief complaint of check circulation.  History of Present Illness:   The patient returns to the office for followup and review status post angiogram with intervention on 09/12/2018.    Procedure(s) Performed:             1.   Introduction catheter into left lower extremity 3rd order catheter placement from right femoral approach             2.   Introduction catheter into left lower extremity third order catheter placement from left ankle approach             3.   Ultrasound-guided access to the right common femoral artery and left posterior tibial artery artery             4.   Crosser atherectomy with percutaneous transluminal angioplasty and stent placement left superficial femoral and popliteal arteries             4.  Percutaneous transluminal angioplasty left proximal posterior tibial and tibial peroneal trunk             5.  Percutaneous transluminal angioplasty left external iliac artery secondary to in-stent restenosis             6.  Star close closure right common femoral arteriotomy and manual pressure held left posterior tibial at the ankle   The patient notes improvement in the lower extremity symptoms. No interval shortening of the patient's claudication distance or rest pain symptoms. No new ulcers or wounds have occurred since the last visit.   There have been no significant changes to the patient's overall health care.   The patient denies amaurosis fugax or recent TIA symptoms. There are no recent neurological changes noted. The patient denies history of DVT, PE or superficial thrombophlebitis. The patient denies recent episodes of angina or shortness of breath.    ABI's Rt=0.88 and Lt=0.96  (previous ABI's Rt=1.13 and Lt=1.15)   Previous duplex ultrasound of the left SFA mild to moderate stenosis of the proximal SFA,  the SFA stent is widely patent  Current Meds  Medication Sig   amLODipine (NORVASC) 10 MG tablet TAKE 1 TABLET(10 MG) BY MOUTH DAILY   atorvastatin (LIPITOR) 40 MG tablet Take 1 tablet (40 mg total) by mouth daily.   cilostazol (PLETAL) 100 MG tablet Take 1 tablet by mouth twice a day   clopidogrel (PLAVIX) 75 MG tablet Take 1 tablet (75 mg total) by mouth daily. TAKE 1 TABLET(75 MG) BY MOUTH DAILY   lisinopril (ZESTRIL) 5 MG tablet Take 1 tablet (5 mg total) by mouth daily.   Multiple Vitamins-Minerals (MULTIVITAMIN ADULT PO) Take 1 tablet by mouth daily.    Vitamin D3 (VITAMIN D) 25 MCG tablet Take 1 tablet by mouth daily.    Past Medical History:  Diagnosis Date   Cancer [facility]) 2006   prostate    Colon polyps    Coronary artery disease    Dyspnea    With exertion climbing up hill  Hyperlipidemia    Hypertension    Myocardial infarction (HCC)    Prostate CA (HCC)    Smoker    Squamous acanthoma of external ear, right     Past Surgical History:  Procedure Laterality Date   APPENDECTOMY  1980   CARDIAC SURGERY     COLONOSCOPY WITH PROPOFOL N/A 03/02/2018   Procedure: COLONOSCOPY WITH PROPOFOL;  Surgeon: Midge Minium, MD;  Location: [facility];  Service: Endoscopy;  Laterality: N/A;   CORONARY ANGIOPLASTY WITH STENT PLACEMENT     CYSTOSCOPY W/ RETROGRADES Bilateral 09/23/2019   Procedure: CYSTOSCOPY WITH RETROGRADE PYELOGRAM;  Surgeon: Vanna Scotland, MD;  Location: [facility] OR[facility];  Service: Urology;  Laterality: Bilateral;   CYSTOSCOPY WITH URETHRAL DILATATION N/A 09/23/2019   Procedure: CYSTOSCOPY WITH URETHRAL DILATATION;  Surgeon: Vanna Scotland, MD;  Location: [facility];  Service: Urology;  Laterality: N/A;   ESOPHAGOGASTRODUODENOSCOPY N/A 11/07/2017   Procedure: ESOPHAGOGASTRODUODENOSCOPY (EGD);  Surgeon: Leafy Ro, MD;  Location: [facility];  Service: General;  Laterality: N/A;   ESOPHAGOGASTRODUODENOSCOPY (EGD) WITH PROPOFOL  03/02/2018   Procedure:  ESOPHAGOGASTRODUODENOSCOPY (EGD) WITH PROPOFOL;  Surgeon: Midge Minium, MD;  Location: [facility];  Service: Endoscopy;;   EUS N/A 07/13/2017   Procedure: FULL UPPER ENDOSCOPIC ULTRASOUND (EUS) RADIAL;  Surgeon: Rayann Heman, MD;  Location: [facility];  Service: Endoscopy;  Laterality: N/A;   HIGH DOSE RADIATION IMPLANT INSERTION  2006   LAPAROSCOPIC REMOVAL ABDOMINAL MASS N/A 11/07/2017   Procedure: LAPAROSCOPIC GASTRIC MASS RESECTION;  Surgeon: Leafy Ro, MD;  Location: [facility];  Service: General;  Laterality: N/A;   LOWER EXTREMITY ANGIOGRAPHY Left 07/04/2018   Procedure: LOWER EXTREMITY ANGIOGRAPHY;  Surgeon: Renford Dills, MD;  Location: [facility];  Service: Cardiovascular;  Laterality: Left;   LOWER EXTREMITY ANGIOGRAPHY Left 08/10/2018   Procedure: LOWER EXTREMITY ANGIOGRAPHY;  Surgeon: Renford Dills, MD;  Location: [facility];  Service: Cardiovascular;  Laterality: Left;   LOWER EXTREMITY ANGIOGRAPHY Left 09/12/2018   Procedure: LOWER EXTREMITY ANGIOGRAPHY;  Surgeon: Renford Dills, MD;  Location: [facility];  Service: Cardiovascular;  Laterality: Left;   POLYPECTOMY  03/02/2018   Procedure: POLYPECTOMY;  Surgeon: Midge Minium, MD;  Location: [facility];  Service: Endoscopy;;   TRANSURETHRAL RESECTION OF BLADDER TUMOR N/A 09/23/2019   Procedure: TRANSURETHRAL RESECTION OF BLADDER TUMOR (TURBT);  Surgeon: Vanna Scotland, MD;  Location: [facility];  Service: Urology;  Laterality: N/A;    Social History Social History   Tobacco Use   Smoking status: Every Day    Packs/day: 1.00    Years: 66.00    Additional pack years: 0.00    Total pack years: 66.00    Types: Cigarettes    Start date: 11/18/1952   Smokeless tobacco: Never   Tobacco comments:         Vaping Use   Vaping Use: Never used  Substance Use Topics   Alcohol use: Yes    Alcohol/week: 1.0 standard drink of alcohol    Types: 1 Cans of beer per week     Comment: occasional   Drug use: Never    Family History Family History  Problem Relation Age of Onset   Cancer Mother        pancreatic   Dementia Father    Cancer Sister        breast    Aneurysm Brother    Bladder Cancer Neg Hx    Prostate cancer Neg Hx  Kidney cancer Neg Hx     Allergies  Allergen Reactions   Penicillins Nausea Only    Has patient had a PCN reaction causing immediate rash, facial/tongue/throat swelling, SOB or lightheadedness with hypotension: no Has patient had a PCN reaction causing severe rash involving mucus membranes or skin necrosis: no Has patient had a PCN reaction that required hospitalization: no Has patient had a PCN reaction occurring within the last 10 years: no If all of the above answers are "NO", then may proceed with Cephalosporin use.    Sulfa Antibiotics Diarrhea     REVIEW OF SYSTEMS (Negative unless checked)  Constitutional: [] Weight loss  [] Fever  [] Chills Cardiac: [] Chest pain   [] Chest pressure   [] Palpitations   [] Shortness of breath when laying flat   [] Shortness of breath with exertion. Vascular:  [x] Pain in legs with walking   [] Pain in legs at rest  [] History of DVT   [] Phlebitis   [] Swelling in legs   [] Varicose veins   [] Non-healing ulcers Pulmonary:   [] Uses home oxygen   [] Productive cough   [] Hemoptysis   [] Wheeze  [] COPD   [] Asthma Neurologic:  [] Dizziness   [] Seizures   [] History of stroke   [] History of TIA  [] Aphasia   [] Vissual changes   [] Weakness or numbness in arm   [] Weakness or numbness in leg Musculoskeletal:   [] Joint swelling   [] Joint pain   [] Low back pain Hematologic:  [] Easy bruising  [] Easy bleeding   [] Hypercoagulable state   [] Anemic Gastrointestinal:  [] Diarrhea   [] Vomiting  [] Gastroesophageal reflux/heartburn   [] Difficulty swallowing. Genitourinary:  [] Chronic kidney disease   [] Difficult urination  [] Frequent urination   [] Blood in urine Skin:  [] Rashes   [] Ulcers  Psychological:  [] History of  anxiety   []  History of major depression.  Physical Examination  Vitals:   06/27/22 1100  BP: 128/66  Pulse: 66  Resp: 16  Weight: 126 lb 3.2 oz (57.2 kg)  Height: 5\' 5"  (1.651 m)   Body mass index is 21 kg/m. Gen: WD/WN, NAD Head: [place]/AT, No temporalis wasting.  Ear/Nose/Throat: Hearing grossly intact, nares w/o erythema or drainage Eyes: PER, EOMI, sclera nonicteric.  Neck: Supple, no masses.  No bruit or JVD.  Pulmonary:  Good air movement, no audible wheezing, no use of accessory muscles.  Cardiac: RRR, normal S1, S2, no Murmurs. Vascular:  mild trophic changes, no open wounds Vessel Right Left  Radial Palpable Palpable  PT Trace Palpable Trace  Palpable  DP Trace  Palpable Trace  Palpable  Gastrointestinal: soft, non-distended. No guarding/no peritoneal signs.  Musculoskeletal: M/S 5/5 throughout.  No visible deformity.  Neurologic: CN 2-12 intact. Pain and light touch intact in extremities.  Symmetrical.  Speech is fluent. Motor exam as listed above. Psychiatric: Judgment intact, Mood & affect appropriate for pt's clinical situation. Dermatologic: No rashes or ulcers noted.  No changes consistent with cellulitis.   CBC Lab Results  Component Value Date   WBC 7.3 05/26/2022   HGB 13.6 05/26/2022   HCT 38.3 05/26/2022   MCV 98 (H) 05/26/2022   PLT 225 05/26/2022    BMET    Component Value Date/Time   NA 142 05/26/2022 1032   K 4.7 05/26/2022 1032   CL 101 05/26/2022 1032   CO2 23 05/26/2022 1032   GLUCOSE 101 (H) 05/26/2022 1032   GLUCOSE 133 (H) 07/06/2021 2049   BUN 13 05/26/2022 1032   CREATININE 0.85 05/26/2022 1032   CALCIUM 9.7 05/26/2022 1032   GFRNONAA >  60 07/06/2021 2049   GFRAA >60 09/16/2019 1032   CrCl cannot be calculated (Patient's most recent lab result is older than the maximum 21 days allowed.).  COAG Lab Results  Component Value Date   INR 0.98 11/02/2017    Radiology No results found.   Assessment/Plan 1. Atherosclerosis of  native artery of both lower extremities with intermittent claudication (HCC) Recommend:  The patient is status post successful angiogram with intervention.  The patient reports that the claudication symptoms and leg pain has improved.   The patient denies lifestyle limiting changes at this point in time.  No further invasive studies, angiography or surgery at this time The patient should continue walking and begin a more formal exercise program.  The patient should continue antiplatelet therapy and aggressive treatment of the lipid abnormalities  Continued surveillance is indicated as atherosclerosis is likely to progress with time.    Patient should undergo noninvasive studies as ordered. The patient will follow up with me to review the studies.  - VAS [place] ABI WITH/WO TBI; Future  2. Chronic venous insufficiency Recommend:  No surgery or intervention at this point in time.  I have reviewed my discussion with the patient regarding venous insufficiency and why it causes symptoms. I have discussed with the patient the chronic skin changes that accompany venous insufficiency and the long term sequela such as ulceration. Patient will contnue wearing graduated compression stockings on a daily basis, as this has provided excellent control of his edema. The patient will put the stockings on first thing in the morning and removing them in the evening. The patient is reminded not to sleep in the stockings.  In addition, behavioral modification including elevation during the day will be initiated. Exercise is strongly encouraged.  Previous duplex ultrasound of the lower extremities shows normal deep system, no significant superficial reflux was identified.  Given the patient's good control and lack of any problems regarding the venous insufficiency and lymphedema a lymph pump in not need at this time.     3. Coronary artery disease involving native coronary artery of native heart without angina  pectoris Continue cardiac and antihypertensive medications as already ordered and reviewed, no changes at this time.  Continue statin as ordered and reviewed, no changes at this time  Nitrates PRN for chest pain  4. Benign essential HTN Continue antihypertensive medications as already ordered, these medications have been reviewed and there are no changes at this time.  5. Hyperlipidemia, mixed Continue statin as ordered and reviewed, no changes at this time    Levora Dredge, MD  06/27/2022 11:07 AM

## 2022-06-29 DIAGNOSIS — C44329 Squamous cell carcinoma of skin of other parts of face: Secondary | ICD-10-CM | POA: Diagnosis not present

## 2022-06-29 DIAGNOSIS — D0439 Carcinoma in situ of skin of other parts of face: Secondary | ICD-10-CM | POA: Diagnosis not present

## 2022-06-29 LAB — VAS US ABI WITH/WO TBI
Left ABI: 0.96
Right ABI: 0.88

## 2022-07-03 ENCOUNTER — Encounter (INDEPENDENT_AMBULATORY_CARE_PROVIDER_SITE_OTHER): Payer: Self-pay | Admitting: Vascular Surgery

## 2022-07-12 ENCOUNTER — Other Ambulatory Visit: Payer: Medicare Other | Admitting: Urology

## 2022-07-13 ENCOUNTER — Other Ambulatory Visit: Payer: Self-pay | Admitting: Family Medicine

## 2022-07-13 DIAGNOSIS — I739 Peripheral vascular disease, unspecified: Secondary | ICD-10-CM

## 2022-07-13 DIAGNOSIS — I10 Essential (primary) hypertension: Secondary | ICD-10-CM

## 2022-07-13 DIAGNOSIS — I2510 Atherosclerotic heart disease of native coronary artery without angina pectoris: Secondary | ICD-10-CM

## 2022-08-02 ENCOUNTER — Other Ambulatory Visit: Payer: Self-pay

## 2022-08-02 DIAGNOSIS — I70219 Atherosclerosis of native arteries of extremities with intermittent claudication, unspecified extremity: Secondary | ICD-10-CM

## 2022-08-02 MED ORDER — CILOSTAZOL 100 MG PO TABS: ORAL_TABLET | ORAL | 0 refills | Status: DC

## 2022-09-05 ENCOUNTER — Ambulatory Visit (INDEPENDENT_AMBULATORY_CARE_PROVIDER_SITE_OTHER): Payer: Medicare Other

## 2022-09-05 VITALS — BP 110/60 | HR 72 | Ht 65.0 in | Wt 126.0 lb

## 2022-09-05 DIAGNOSIS — Z0000 Encounter for general adult medical examination without abnormal findings: Secondary | ICD-10-CM | POA: Diagnosis not present

## 2022-09-05 NOTE — Progress Notes (Signed)
Subjective:   Dylan Villanueva is a 82 y.o. male who presents for Medicare Annual/Subsequent preventive examination.  Visit Complete: In person  Cardiac Risk Factors include: dyslipidemia;hypertension;male gender;smoking/ tobacco exposure;Other (see comment), Risk factor comments: CAD     Objective:    Today's Vitals   09/05/22 0914 09/05/22 0916  Weight: 126 lb (57.2 kg)   Height: 5\' 5"  (1.651 m)   PainSc:  0-No pain   Body mass index is 20.97 kg/m.     09/05/2022    9:23 AM 07/06/2021    8:48 PM 08/12/2020    8:37 AM 09/23/2019    9:18 AM 09/13/2019    1:30 PM 08/12/2019   11:29 AM 09/12/2018    7:09 AM  Advanced Directives  Does Patient Have a Medical Advance Directive? Yes No Yes Yes Yes Yes Yes  Type of Forensic scientist of [place];Living will Healthcare Power of [employer] of [place];Living will Healthcare Power of [place];Living will Healthcare Power of [place];Living will  Does patient want to make changes to medical advance directive? No - Patient declined   No - Patient declined   No - Patient declined  Copy of Healthcare Power of Attorney in Chart? No - copy requested  No - copy requested No - copy requested No - copy requested No - copy requested No - copy requested    Current Medications (verified) Outpatient Encounter Medications as of 09/05/2022  Medication Sig   amLODipine (NORVASC) 10 MG tablet TAKE 1 TABLET(10 MG) BY MOUTH DAILY   atorvastatin (LIPITOR) 40 MG tablet Take 1 tablet (40 mg total) by mouth daily.   cilostazol (PLETAL) 100 MG tablet Take 1 tablet by mouth twice a day   clopidogrel (PLAVIX) 75 MG tablet Take 1 tablet (75 mg total) by mouth daily. TAKE 1 TABLET(75 MG) BY MOUTH DAILY   lisinopril (ZESTRIL) 5 MG tablet TAKE 1 TABLET(5 MG) BY MOUTH DAILY   Multiple Vitamins-Minerals (MULTIVITAMIN ADULT PO) Take 1 tablet by mouth daily.    Vitamin D3 (VITAMIN D) 25 MCG tablet Take 1 tablet by  mouth daily.   No facility-administered encounter medications on file as of 09/05/2022.    Allergies (verified) Penicillins and Sulfa antibiotics   History: Past Medical History:  Diagnosis Date   Cancer (HCC) 2006   prostate    Colon polyps    Coronary artery disease    Dyspnea    With exertion climbing up hill   Hyperlipidemia    Hypertension    Myocardial infarction [facility])    Prostate CA (HCC)    Smoker    Squamous acanthoma of external ear, right    Past Surgical History:  Procedure Laterality Date   APPENDECTOMY  1980   CARDIAC SURGERY     COLONOSCOPY WITH PROPOFOL N/A 03/02/2018   Procedure: COLONOSCOPY WITH PROPOFOL;  Surgeon: Midge Minium, MD;  Location: [facility];  Service: Endoscopy;  Laterality: N/A;   CORONARY ANGIOPLASTY WITH STENT PLACEMENT     CYSTOSCOPY W/ RETROGRADES Bilateral 09/23/2019   Procedure: CYSTOSCOPY WITH RETROGRADE PYELOGRAM;  Surgeon: Vanna Scotland, MD;  Location: [facility] ORS;  Service: Urology;  Laterality: Bilateral;   CYSTOSCOPY WITH URETHRAL DILATATION N/A 09/23/2019   Procedure: CYSTOSCOPY WITH URETHRAL DILATATION;  Surgeon: Vanna Scotland, MD;  Location: [facility] ORS;  Service: Urology;  Laterality: N/A;   ESOPHAGOGASTRODUODENOSCOPY N/A 11/07/2017   Procedure: ESOPHAGOGASTRODUODENOSCOPY (EGD);  Surgeon: Leafy Ro, MD;  Location: [facility] ORS;  Service: General;  Laterality: N/A;   ESOPHAGOGASTRODUODENOSCOPY (EGD) WITH PROPOFOL  03/02/2018   Procedure: ESOPHAGOGASTRODUODENOSCOPY (EGD) WITH PROPOFOL;  Surgeon: Midge Minium, MD;  Location: [facility];  Service: Endoscopy;;   EUS N/A 07/13/2017   Procedure: FULL UPPER ENDOSCOPIC ULTRASOUND (EUS) RADIAL;  Surgeon: Rayann Heman, MD;  Location: [facility] ENDOSCOPY;  Service: Endoscopy;  Laterality: N/A;   HIGH DOSE RADIATION IMPLANT INSERTION  2006   LAPAROSCOPIC REMOVAL ABDOMINAL MASS N/A 11/07/2017   Procedure: LAPAROSCOPIC GASTRIC MASS RESECTION;  Surgeon: Leafy Ro, MD;  Location: [facility]  ORS;  Service: General;  Laterality: N/A;   LOWER EXTREMITY ANGIOGRAPHY Left 07/04/2018   Procedure: LOWER EXTREMITY ANGIOGRAPHY;  Surgeon: Renford Dills, MD;  Location: [facility] INVASIVE CV LAB;  Service: Cardiovascular;  Laterality: Left;   LOWER EXTREMITY ANGIOGRAPHY Left 08/10/2018   Procedure: LOWER EXTREMITY ANGIOGRAPHY;  Surgeon: Renford Dills, MD;  Location: [facility] CV LAB;  Service: Cardiovascular;  Laterality: Left;   LOWER EXTREMITY ANGIOGRAPHY Left 09/12/2018   Procedure: LOWER EXTREMITY ANGIOGRAPHY;  Surgeon: Renford Dills, MD;  Location: [facility] INVASIVE CV LAB;  Service: Cardiovascular;  Laterality: Left;   POLYPECTOMY  03/02/2018   Procedure: POLYPECTOMY;  Surgeon: Midge Minium, MD;  Location: [facility];  Service: Endoscopy;;   TRANSURETHRAL RESECTION OF BLADDER TUMOR N/A 09/23/2019   Procedure: TRANSURETHRAL RESECTION OF BLADDER TUMOR (TURBT);  Surgeon: Vanna Scotland, MD;  Location: [facility] ORS;  Service: Urology;  Laterality: N/A;   Family History  Problem Relation Age of Onset   Cancer Mother        pancreatic   Dementia Father    Cancer Sister        breast    Aneurysm Brother    Bladder Cancer Neg Hx    Prostate cancer Neg Hx    Kidney cancer Neg Hx    Social History   Socioeconomic History   Marital status: Married    Spouse name: Johnny Bridge   Number of children: 2   Years of education: Not on file   Highest education level: Bachelor's degree (e.g., BA, AB, BS)  Occupational History   Occupation: retired   Tobacco Use   Smoking status: Every Day    Current packs/day: 1.00    Average packs/day: 1 pack/day for 69.8 years (69.8 ttl pk-yrs)    Types: Cigarettes    Start date: 11/18/1952   Smokeless tobacco: Never   Tobacco comments:         Vaping Use   Vaping status: Never Used  Substance and Sexual Activity   Alcohol use: Yes    Alcohol/week: 1.0 standard drink of alcohol    Types: 1 Cans of beer per week    Comment: occasional   Drug  use: Never   Sexual activity: Not Currently  Other Topics Concern   Not on file  Social History Narrative   Not on file   Social Determinants of Health   Financial Resource Strain: Low Risk  (09/05/2022)   Overall Financial Resource Strain (CARDIA)    Difficulty of Paying Living Expenses: Not hard at all  Food Insecurity: No Food Insecurity (09/05/2022)   Hunger Vital Sign    Worried About Running Out of Food in the Last Year: Never true    Ran Out of Food in the Last Year: Never true  Transportation Needs: No Transportation Needs (09/05/2022)   PRAPARE - Administrator, Civil Service (Medical): No    Lack of Transportation (Non-Medical): No  Physical Activity: Inactive (  09/05/2022)   Exercise Vital Sign    Days of Exercise per Week: 0 days    Minutes of Exercise per Session: 0 min  Stress: No Stress Concern Present (09/05/2022)   [employer] of Occupational Health - Occupational Stress Questionnaire    Feeling of Stress : Only a little  Social Connections: Moderately Isolated (09/05/2022)   Social Connection and Isolation Panel [NHANES]    Frequency of Communication with Friends and Family: More than three times a week    Frequency of Social Gatherings with Friends and Family: More than three times a week    Attends Religious Services: Never    Database administrator or Organizations: No    Attends Engineer, structural: Never    Marital Status: Married    Tobacco Counseling Ready to quit: No Counseling given: Yes Tobacco comments:       Clinical Intake:  Pre-visit preparation completed: No  Pain : No/denies pain Pain Score: 0-No pain     BMI - recorded: 20.97 Nutritional Status: BMI of 19-24  Normal Nutritional Risks: None Diabetes: No  How often do you need to have someone help you when you read instructions, pamphlets, or other written materials from your doctor or pharmacy?: 1 - Never  Interpreter Needed?: No  Information entered by  :: Arthur Holms   Activities of Daily Living    09/05/2022    9:17 AM  In your present state of health, do you have any difficulty performing the following activities:  Hearing? 1  Comment no hearing aides  Vision? 0  Difficulty concentrating or making decisions? 0  Walking or climbing stairs? 0  Dressing or bathing? 0  Doing errands, shopping? 0  Preparing Food and eating ? N  Using the Toilet? N  In the past six months, have you accidently leaked urine? N  Do you have problems with loss of bowel control? N  Managing your Medications? N  Managing your Finances? N  Housekeeping or managing your Housekeeping? N    Patient Care Team: Duanne Limerick, MD as PCP - General (Family Medicine) Lamar Blinks, MD as Consulting Physician (Cardiology) Vanna Scotland, MD as Consulting Physician (Urology) Schnier, Latina Craver, MD (Vascular Surgery) Midge Minium, MD as Consulting Physician (Gastroenterology)  Indicate any recent Medical Services you may have received from other than [facility] providers in the past year (date may be approximate).     Assessment:   This is a routine wellness examination for Brad.  Hearing/Vision screen Vision Screening   Right eye Left eye Both eyes  Without correction 20/50 20/40 20/25   With correction       Dietary issues and exercise activities discussed:     Goals Addressed             This Visit's Progress    DIET - INCREASE WATER INTAKE         Depression Screen    09/05/2022    9:25 AM 09/05/2022    9:22 AM 05/26/2022    9:49 AM 11/24/2021    9:38 AM 08/30/2021    9:05 AM 08/30/2021    9:02 AM 05/25/2021    9:09 AM  PHQ 2/9 Scores  PHQ - 2 Score 0 0 0 0 0 0 0  PHQ- 9 Score   0 0 0 0 0    Fall Risk    09/05/2022    9:25 AM 05/26/2022    9:49 AM 11/24/2021    9:38 AM 08/30/2021  8:59 AM 11/23/2020    9:40 AM  Fall Risk   Falls in the past year? 0 0 0 0 0  Number falls in past yr: 0 0 0 0 0  Injury with Fall? 0 0 0 0 0   Risk for fall due to : No Fall Risks No Fall Risks No Fall Risks No Fall Risks No Fall Risks  Follow up Falls evaluation completed;Falls prevention discussed Falls evaluation completed Falls evaluation completed Falls evaluation completed Falls evaluation completed    MEDICARE RISK AT HOME:  Medicare Risk at Home - 09/05/22 0925     Any stairs in or around the home? Yes    If so, are there any without handrails? No    Home free of loose throw rugs in walkways, pet beds, electrical cords, etc? Yes    Adequate lighting in your home to reduce risk of falls? Yes    Life alert? No   advised to get   Use of a cane, walker or w/c? No    Grab bars in the bathroom? Yes    Shower chair or bench in shower? Yes    Elevated toilet seat or a handicapped toilet? No             TIMED UP AND GO:  Was the test performed?  Yes  Length of time to ambulate 10 feet: 5 sec Gait steady and fast with assistive device    Cognitive Function:        09/05/2022    9:26 AM 08/30/2021    9:02 AM 08/12/2019   11:43 AM 08/08/2018   11:52 AM 06/01/2017   11:45 AM  6CIT Screen  What Year? 0 points 0 points 0 points 0 points 0 points  What month? 0 points 0 points 0 points 0 points 0 points  What time? 0 points 0 points 0 points 0 points 0 points  Count back from 20 0 points 0 points 0 points 0 points 0 points  Months in reverse 0 points 0 points 0 points 0 points 0 points  Repeat phrase 0 points 0 points 0 points 0 points 0 points  Total Score 0 points 0 points 0 points 0 points 0 points    Immunizations Immunization History  Administered Date(s) Administered   Fluad Quad(high Dose 65+) 11/15/2019, 11/23/2020, 11/24/2021   Influenza, High Dose Seasonal PF 01/20/2017, 12/13/2017, 11/10/2018   PFIZER(Purple Top)SARS-COV-2 Vaccination 02/28/2019, 03/22/2019, 12/08/2019   Pneumococcal Conjugate-13 05/08/2017   Pneumococcal Polysaccharide-23 05/20/2019   Tdap 05/16/2017   Zoster Recombinant(Shingrix)  05/16/2017, 07/26/2017    TDAP status: Up to date  Flu Vaccine status: Up to date  Pneumococcal vaccine status: Due, Education has been provided regarding the importance of this vaccine. Advised may receive this vaccine at local pharmacy or Health Dept. Aware to provide a copy of the vaccination record if obtained from local pharmacy or Health Dept. Verbalized acceptance and understanding.  Covid-19 vaccine status: Completed vaccines  Qualifies for Shingles Vaccine? No   Zostavax completed No   Shingrix Completed?: Yes  Screening Tests Health Maintenance  Topic Date Due   COVID-19 Vaccine (4 - 2023-24 season) 09/21/2022 (Originally 10/15/2021)   INFLUENZA VACCINE  09/15/2022   Medicare Annual Wellness (AWV)  09/05/2023   DTaP/Tdap/Td (2 - Td or Tdap) 05/17/2027   Pneumonia Vaccine 34+ Years old  Completed   Zoster Vaccines- Shingrix  Completed   HPV VACCINES  Aged Out    Health Maintenance  There are no preventive  care reminders to display for this patient.   Colorectal cancer screening: Type of screening: Colonoscopy. Completed 03/02/2018. Repeat every aged out years  Lung Cancer Screening: (Low Dose CT Chest recommended if Age 77-80 years, 20 pack-year currently smoking OR have quit w/in 15years.) does qualify.   Lung Cancer Screening Referral: refused  Additional Screening:  Hepatitis C Screening: does not qualify; Completed refused   Vision Screening: Recommended annual ophthalmology exams for early detection of glaucoma and other disorders of the eye. Is the patient up to date with their annual eye exam?  Yes  Who is the provider or what is the name of the office in which the patient attends annual eye exams? [facility] If pt is not established with a provider, would they like to be referred to a provider to establish care? No .   Dental Screening: Recommended annual dental exams for proper oral hygiene   Community Resource Referral / Chronic Care Management: CRR  required this visit?  No   CCM required this visit?  No     Plan:     I have personally reviewed and noted the following in the patient's chart:   Medical and social history Use of alcohol, tobacco or illicit drugs  Current medications and supplements including opioid prescriptions. Patient is not currently taking opioid prescriptions. Functional ability and status Nutritional status Physical activity Advanced directives List of other physicians Hospitalizations, surgeries, and ER visits in previous 12 months Vitals Screenings to include cognitive, depression, and falls Referrals and appointments  In addition, I have reviewed and discussed with patient certain preventive protocols, quality metrics, and best practice recommendations. A written personalized care plan for preventive services as well as general preventive health recommendations were provided to patient.     Everitt Amber   09/05/2022   After Visit Summary: pt does not want visit summary  Nurse Notes: none

## 2022-09-05 NOTE — Patient Instructions (Signed)
[name] , Thank you for taking time to come for your Medicare Wellness Visit. I appreciate your ongoing commitment to your health goals. Please review the following plan we discussed and let me know if I can assist you in the future.   These are the goals we discussed:  Goals      DIET - INCREASE WATER INTAKE     Recommend to drink at least 6-8 8oz glasses of water per day.     DIET - INCREASE WATER INTAKE        This is a list of the screening recommended for you and due dates:  Health Maintenance  Topic Date Due   COVID-19 Vaccine (4 - 2023-24 season) 09/21/2022*   Flu Shot  09/15/2022   Medicare Annual Wellness Visit  09/05/2023   DTaP/Tdap/Td vaccine (2 - Td or Tdap) 05/17/2027   Pneumonia Vaccine  Completed   Zoster (Shingles) Vaccine  Completed   HPV Vaccine  Aged Out  *Topic was postponed. The date shown is not the original due date.   Health Maintenance After Age 32 After age 20, you are at a higher risk for certain long-term diseases and infections as well as injuries from falls. Falls are a major cause of broken bones and head injuries in people who are older than age 67. Getting regular preventive care can help to keep you healthy and well. Preventive care includes getting regular testing and making lifestyle changes as recommended by your health care provider. Talk with your health care provider about: Which screenings and tests you should have. A screening is a test that checks for a disease when you have no symptoms. A diet and exercise plan that is right for you. What should I know about screenings and tests to prevent falls? Screening and testing are the best ways to find a health problem early. Early diagnosis and treatment give you the best chance of managing medical conditions that are common after age 35. Certain conditions and lifestyle choices may make you more likely to have a fall. Your health care provider may recommend: Regular vision checks. Poor vision and  conditions such as cataracts can make you more likely to have a fall. If you wear glasses, make sure to get your prescription updated if your vision changes. Medicine review. Work with your health care provider to regularly review all of the medicines you are taking, including over-the-counter medicines. Ask your health care provider about any side effects that may make you more likely to have a fall. Tell your health care provider if any medicines that you take make you feel dizzy or sleepy. Strength and balance checks. Your health care provider may recommend certain tests to check your strength and balance while standing, walking, or changing positions. Foot health exam. Foot pain and numbness, as well as not wearing proper footwear, can make you more likely to have a fall. Screenings, including: Osteoporosis screening. Osteoporosis is a condition that causes the bones to get weaker and break more easily. Blood pressure screening. Blood pressure changes and medicines to control blood pressure can make you feel dizzy. Depression screening. You may be more likely to have a fall if you have a fear of falling, feel depressed, or feel unable to do activities that you used to do. Alcohol use screening. Using too much alcohol can affect your balance and may make you more likely to have a fall. Follow these instructions at home: Lifestyle Do not drink alcohol if: Your health care  provider tells you not to drink. If you drink alcohol: Limit how much you have to: 0-1 drink a day for women. 0-2 drinks a day for men. Know how much alcohol is in your drink. In the [place] one drink equals one 12 oz bottle of beer (355 mL), one 5 oz glass of wine (148 mL), or one 1 oz glass of hard liquor (44 mL). Do not use any products that contain nicotine or tobacco. These products include cigarettes, chewing tobacco, and vaping devices, such as e-cigarettes. If you need help quitting, ask your health care  provider. Activity  Follow a regular exercise program to stay fit. This will help you maintain your balance. Ask your health care provider what types of exercise are appropriate for you. If you need a cane or walker, use it as recommended by your health care provider. Wear supportive shoes that have nonskid soles. Safety  Remove any tripping hazards, such as rugs, cords, and clutter. Install safety equipment such as grab bars in bathrooms and safety rails on stairs. Keep rooms and walkways well-lit. General instructions Talk with your health care provider about your risks for falling. Tell your health care provider if: You fall. Be sure to tell your health care provider about all falls, even ones that seem minor. You feel dizzy, tiredness (fatigue), or off-balance. Take over-the-counter and prescription medicines only as told by your health care provider. These include supplements. Eat a healthy diet and maintain a healthy weight. A healthy diet includes low-fat dairy products, low-fat (lean) meats, and fiber from whole grains, beans, and lots of fruits and vegetables. Stay current with your vaccines. Schedule regular health, dental, and eye exams. Summary Having a healthy lifestyle and getting preventive care can help to protect your health and wellness after age 33. Screening and testing are the best way to find a health problem early and help you avoid having a fall. Early diagnosis and treatment give you the best chance for managing medical conditions that are more common for people who are older than age 53. Falls are a major cause of broken bones and head injuries in people who are older than age 85. Take precautions to prevent a fall at home. Work with your health care provider to learn what changes you can make to improve your health and wellness and to prevent falls. This information is not intended to replace advice given to you by your health care provider. Make sure you discuss  any questions you have with your health care provider. Document Revised: 06/22/2020 Document Reviewed: 06/22/2020 Elsevier Patient Education  2024 [employer].

## 2022-10-28 ENCOUNTER — Other Ambulatory Visit: Payer: Self-pay | Admitting: Family Medicine

## 2022-10-28 DIAGNOSIS — I70219 Atherosclerosis of native arteries of extremities with intermittent claudication, unspecified extremity: Secondary | ICD-10-CM

## 2022-10-31 NOTE — Telephone Encounter (Signed)
Requested Prescriptions  Pending Prescriptions Disp Refills   cilostazol (PLETAL) 100 MG tablet [Pharmacy Med Name: CILOSTAZOL 100MG  TABLETS] 180 tablet 0    Sig: TAKE 1 TABLET BY MOUTH TWICE DAILY     Hematology: Antiplatelets - cilostazol Passed - 10/28/2022  2:10 PM      Passed - PLT in normal range and within 360 days    Platelets  Date Value Ref Range Status  05/26/2022 225 150 - 450 x10E3/uL Final         Passed - WBC in normal range and within 360 days    WBC  Date Value Ref Range Status  05/26/2022 7.3 3.4 - 10.8 x10E3/uL Final  07/06/2021 13.2 (H) 4.0 - 10.5 K/uL Final         Passed - Valid encounter within last 6 months    Recent Outpatient Visits           5 months ago PAD (peripheral artery disease) (HCC)   [place] [facility]   11 months ago Essential hypertension   [place] [facility]   1 year ago Essential hypertension   [place] [facility]   1 year ago Essential hypertension   [place] [facility]   2 years ago Anemia, macrocytic   [place] [facility]       Future Appointments             In 3 weeks Duanne Limerick, MD [facility] at [facility]

## 2022-11-03 ENCOUNTER — Other Ambulatory Visit: Payer: Self-pay | Admitting: Family Medicine

## 2022-11-03 DIAGNOSIS — I2510 Atherosclerotic heart disease of native coronary artery without angina pectoris: Secondary | ICD-10-CM

## 2022-11-03 DIAGNOSIS — I70219 Atherosclerosis of native arteries of extremities with intermittent claudication, unspecified extremity: Secondary | ICD-10-CM

## 2022-11-04 NOTE — Telephone Encounter (Signed)
Requested Prescriptions  Pending Prescriptions Disp Refills   clopidogrel (PLAVIX) 75 MG tablet [Pharmacy Med Name: CLOPIDOGREL 75MG  TABLETS] 90 tablet 0    Sig: TAKE 1 TABLET(75 MG) BY MOUTH DAILY     Hematology: Antiplatelets - clopidogrel Passed - 11/03/2022  6:19 AM      Passed - HCT in normal range and within 180 days    Hematocrit  Date Value Ref Range Status  05/26/2022 38.3 37.5 - 51.0 % Final         Passed - HGB in normal range and within 180 days    Hemoglobin  Date Value Ref Range Status  05/26/2022 13.6 13.0 - 17.7 g/dL Final         Passed - PLT in normal range and within 180 days    Platelets  Date Value Ref Range Status  05/26/2022 225 150 - 450 x10E3/uL Final         Passed - Cr in normal range and within 360 days    Creatinine, Ser  Date Value Ref Range Status  05/26/2022 0.85 0.76 - 1.27 mg/dL Final         Passed - Valid encounter within last 6 months    Recent Outpatient Visits           5 months ago PAD (peripheral artery disease) (HCC)   [place] [facility]   11 months ago Essential hypertension   [place] [facility]   1 year ago Essential hypertension   [place] [facility]   1 year ago Essential hypertension   [place] [facility]   2 years ago Anemia, macrocytic   [place] [facility]       Future Appointments             In 3 weeks Duanne Limerick, MD [facility] & [facility] at [facility], [facility]

## 2022-11-25 ENCOUNTER — Ambulatory Visit (INDEPENDENT_AMBULATORY_CARE_PROVIDER_SITE_OTHER): Payer: Medicare Other | Admitting: Family Medicine

## 2022-11-25 ENCOUNTER — Encounter: Payer: Self-pay | Admitting: Family Medicine

## 2022-11-25 VITALS — BP 116/64 | HR 69 | Ht 65.5 in | Wt 122.0 lb

## 2022-11-25 DIAGNOSIS — I2510 Atherosclerotic heart disease of native coronary artery without angina pectoris: Secondary | ICD-10-CM

## 2022-11-25 DIAGNOSIS — E782 Mixed hyperlipidemia: Secondary | ICD-10-CM

## 2022-11-25 DIAGNOSIS — I10 Essential (primary) hypertension: Secondary | ICD-10-CM

## 2022-11-25 DIAGNOSIS — I739 Peripheral vascular disease, unspecified: Secondary | ICD-10-CM

## 2022-11-25 DIAGNOSIS — I70219 Atherosclerosis of native arteries of extremities with intermittent claudication, unspecified extremity: Secondary | ICD-10-CM

## 2022-11-25 DIAGNOSIS — R3129 Other microscopic hematuria: Secondary | ICD-10-CM

## 2022-11-25 DIAGNOSIS — R634 Abnormal weight loss: Secondary | ICD-10-CM

## 2022-11-25 DIAGNOSIS — Z8509 Personal history of malignant neoplasm of other digestive organs: Secondary | ICD-10-CM

## 2022-11-25 DIAGNOSIS — J301 Allergic rhinitis due to pollen: Secondary | ICD-10-CM | POA: Diagnosis not present

## 2022-11-25 DIAGNOSIS — D7589 Other specified diseases of blood and blood-forming organs: Secondary | ICD-10-CM | POA: Diagnosis not present

## 2022-11-25 LAB — POCT URINALYSIS DIPSTICK
Appearance: NORMAL
Bilirubin, UA: NEGATIVE
Glucose, UA: NEGATIVE
Ketones, UA: NEGATIVE
Nitrite, UA: NEGATIVE
Protein, UA: NEGATIVE
Spec Grav, UA: 1.015 (ref 1.010–1.025)
Urobilinogen, UA: 0.2 U/dL
pH, UA: 6 (ref 5.0–8.0)

## 2022-11-25 MED ORDER — LISINOPRIL 5 MG PO TABS: 5.0000 mg | ORAL_TABLET | Freq: Every day | ORAL | 1 refills | Status: DC

## 2022-11-25 MED ORDER — AMLODIPINE BESYLATE 10 MG PO TABS: ORAL_TABLET | ORAL | 1 refills | Status: DC

## 2022-11-25 MED ORDER — MONTELUKAST SODIUM 10 MG PO TABS: 10.0000 mg | ORAL_TABLET | Freq: Every day | ORAL | 3 refills | Status: DC

## 2022-11-25 MED ORDER — CLOPIDOGREL BISULFATE 75 MG PO TABS: 75.0000 mg | ORAL_TABLET | Freq: Once | ORAL | 0 refills | Status: DC

## 2022-11-25 MED ORDER — CILOSTAZOL 100 MG PO TABS: ORAL_TABLET | ORAL | 0 refills | Status: DC

## 2022-11-25 MED ORDER — ATORVASTATIN CALCIUM 40 MG PO TABS: 40.0000 mg | ORAL_TABLET | Freq: Every day | ORAL | 1 refills | Status: DC

## 2022-11-25 MED ORDER — CLOPIDOGREL BISULFATE 75 MG PO TABS: 75.0000 mg | ORAL_TABLET | Freq: Every day | ORAL | 1 refills | Status: DC

## 2022-11-25 NOTE — Progress Notes (Signed)
Date:  11/25/2022   Name:  Dylan Villanueva   DOB:  09-23-1940   MRN:  [id number]   Chief Complaint: Medication Refill, Sinus Problem (Per patient he has been having a sinus drip. Would like to know if there is anything he could take for it as he has been told he has chronic sinus drip), and Weight Loss (C/o not being able to retain his weight and not sure if he is eating enough. )  Sinus Problem This is a chronic problem. The current episode started more than 1 year ago. The problem has been gradually improving since onset. There has been no fever. He is experiencing no pain. Associated symptoms include shortness of breath. Pertinent negatives include no congestion, coughing, ear pain, headaches, hoarse voice, neck pain or swollen glands.  Hypertension This is a chronic problem. The problem has been gradually improving since onset. The problem is controlled. Associated symptoms include shortness of breath. Pertinent negatives include no anxiety, blurred vision, chest pain, headaches, malaise/fatigue, neck pain, orthopnea, palpitations, peripheral edema, PND or sweats. There are no associated agents to hypertension. Risk factors for coronary artery disease include dyslipidemia. There is no history of chronic renal disease.  Hyperlipidemia This is a chronic problem. The current episode started more than 1 year ago. The problem is controlled. Recent lipid tests were reviewed and are normal. He has no history of chronic renal disease, diabetes, hypothyroidism, liver disease, obesity or nephrotic syndrome. Associated symptoms include shortness of breath. Pertinent negatives include no chest pain or myalgias. Current antihyperlipidemic treatment includes statins. The current treatment provides moderate improvement of lipids.  Heart Problem This is a chronic (cad) problem. The current episode started today. The problem has been gradually improving. Pertinent negatives include no arthralgias, change in bowel  habit, chest pain, congestion, coughing, fatigue, headaches, myalgias, neck pain, swollen glands, visual change or weakness. Nothing aggravates the symptoms. He has tried nothing for the symptoms. The treatment provided moderate relief.    Lab Results  Component Value Date   NA 142 05/26/2022   K 4.7 05/26/2022   CO2 23 05/26/2022   GLUCOSE 101 (H) 05/26/2022   BUN 13 05/26/2022   CREATININE 0.85 05/26/2022   CALCIUM 9.7 05/26/2022   EGFR 86 05/26/2022   GFRNONAA >60 07/06/2021   Lab Results  Component Value Date   CHOL 124 11/24/2021   HDL 69 11/24/2021   LDLCALC 42 11/24/2021   TRIG 57 11/24/2021   CHOLHDL 3.6 05/08/2017   Lab Results  Component Value Date   TSH 1.050 05/08/2017   Lab Results  Component Value Date   HGBA1C 6.0 (H) 05/26/2022   Lab Results  Component Value Date   WBC 7.3 05/26/2022   HGB 13.6 05/26/2022   HCT 38.3 05/26/2022   MCV 98 (H) 05/26/2022   PLT 225 05/26/2022   Lab Results  Component Value Date   ALT 27 07/06/2021   AST 30 07/06/2021   ALKPHOS 61 07/06/2021   BILITOT 1.0 07/06/2021   Lab Results  Component Value Date   VD25OH 36.5 05/08/2017     Review of Systems  Constitutional:  Negative for fatigue, malaise/fatigue and unexpected weight change.  HENT:  Negative for congestion, ear pain, hoarse voice, mouth sores, nosebleeds and postnasal drip.   Eyes:  Negative for blurred vision and visual disturbance.  Respiratory:  Positive for shortness of breath. Negative for apnea, cough, choking, chest tightness and wheezing.   Cardiovascular:  Negative for chest pain, palpitations, orthopnea,  leg swelling and PND.  Gastrointestinal:  Negative for abdominal distention, change in bowel habit, constipation and diarrhea.  Musculoskeletal:  Negative for arthralgias, myalgias and neck pain.  Neurological:  Negative for weakness and headaches.    Patient Active Problem List   Diagnosis Date Noted   Current smoker 04/08/2020    Atherosclerosis of native arteries of extremity with intermittent claudication (HCC) 06/25/2018   Current every day smoker 06/25/2018   Benign gastrointestinal stromal tumor (GIST)    History of colonic polyps    Benign neoplasm of transverse colon    Benign neoplasm of ascending colon    Gastric mass    Bilateral carotid artery stenosis 08/30/2017   Benign essential HTN 05/25/2017   Non-ST elevation myocardial infarction (NSTEMI), subendocardial infarction, subsequent episode of care (HCC) 05/25/2017   Chronic venous insufficiency 05/08/2017   Coronary artery disease involving native coronary artery of native heart 05/08/2017   Hyperlipidemia, mixed 05/08/2017   Hypertension 05/08/2017   History of prostate cancer 05/08/2017   Gait instability 05/08/2017   Vitamin D deficiency 05/08/2017   Presence of stent in coronary artery in patient with coronary artery disease 05/08/2017   History of shingles 05/08/2017    Allergies  Allergen Reactions   Penicillins Nausea Only    Has patient had a PCN reaction causing immediate rash, facial/tongue/throat swelling, SOB or lightheadedness with hypotension: no Has patient had a PCN reaction causing severe rash involving mucus membranes or skin necrosis: no Has patient had a PCN reaction that required hospitalization: no Has patient had a PCN reaction occurring within the last 10 years: no If all of the above answers are "NO", then may proceed with Cephalosporin use.    Sulfa Antibiotics Diarrhea    Past Surgical History:  Procedure Laterality Date   APPENDECTOMY  1980   CARDIAC SURGERY     COLONOSCOPY WITH PROPOFOL N/A 03/02/2018   Procedure: COLONOSCOPY WITH PROPOFOL;  Surgeon: Midge Minium, MD;  Location: [facility];  Service: Endoscopy;  Laterality: N/A;   CORONARY ANGIOPLASTY WITH STENT PLACEMENT     CYSTOSCOPY W/ RETROGRADES Bilateral 09/23/2019   Procedure: CYSTOSCOPY WITH RETROGRADE PYELOGRAM;  Surgeon: Vanna Scotland, MD;   Location: [facility];  Service: Urology;  Laterality: Bilateral;   CYSTOSCOPY WITH URETHRAL DILATATION N/A 09/23/2019   Procedure: CYSTOSCOPY WITH URETHRAL DILATATION;  Surgeon: Vanna Scotland, MD;  Location: [facility];  Service: Urology;  Laterality: N/A;   ESOPHAGOGASTRODUODENOSCOPY N/A 11/07/2017   Procedure: ESOPHAGOGASTRODUODENOSCOPY (EGD);  Surgeon: Leafy Ro, MD;  Location: [facility];  Service: General;  Laterality: N/A;   ESOPHAGOGASTRODUODENOSCOPY (EGD) WITH PROPOFOL  03/02/2018   Procedure: ESOPHAGOGASTRODUODENOSCOPY (EGD) WITH PROPOFOL;  Surgeon: Midge Minium, MD;  Location: [facility];  Service: Endoscopy;;   EUS N/A 07/13/2017   Procedure: FULL UPPER ENDOSCOPIC ULTRASOUND (EUS) RADIAL;  Surgeon: Rayann Heman, MD;  Location: [facility];  Service: Endoscopy;  Laterality: N/A;   HIGH DOSE RADIATION IMPLANT INSERTION  2006   LAPAROSCOPIC REMOVAL ABDOMINAL MASS N/A 11/07/2017   Procedure: LAPAROSCOPIC GASTRIC MASS RESECTION;  Surgeon: Leafy Ro, MD;  Location: [facility];  Service: General;  Laterality: N/A;   LOWER EXTREMITY ANGIOGRAPHY Left 07/04/2018   Procedure: LOWER EXTREMITY ANGIOGRAPHY;  Surgeon: Renford Dills, MD;  Location: [facility];  Service: Cardiovascular;  Laterality: Left;   LOWER EXTREMITY ANGIOGRAPHY Left 08/10/2018   Procedure: LOWER EXTREMITY ANGIOGRAPHY;  Surgeon: Renford Dills, MD;  Location: [facility];  Service: Cardiovascular;  Laterality: Left;  LOWER EXTREMITY ANGIOGRAPHY Left 09/12/2018   Procedure: LOWER EXTREMITY ANGIOGRAPHY;  Surgeon: Renford Dills, MD;  Location: [facility];  Service: Cardiovascular;  Laterality: Left;   POLYPECTOMY  03/02/2018   Procedure: POLYPECTOMY;  Surgeon: Midge Minium, MD;  Location: [facility];  Service: Endoscopy;;   TRANSURETHRAL RESECTION OF BLADDER TUMOR N/A 09/23/2019   Procedure: TRANSURETHRAL RESECTION OF BLADDER TUMOR (TURBT);  Surgeon: Vanna Scotland, MD;   Location: [facility];  Service: Urology;  Laterality: N/A;    Social History   Tobacco Use   Smoking status: Every Day    Current packs/day: 1.00    Average packs/day: 1 pack/day for 70.0 years (70.0 ttl pk-yrs)    Types: Cigarettes    Start date: 11/18/1952   Smokeless tobacco: Never   Tobacco comments:         Vaping Use   Vaping status: Never Used  Substance Use Topics   Alcohol use: Yes    Alcohol/week: 1.0 standard drink of alcohol    Types: 1 Cans of beer per week    Comment: occasional   Drug use: Never     Medication list has been reviewed and updated.  Current Meds  Medication Sig   amLODipine (NORVASC) 10 MG tablet TAKE 1 TABLET(10 MG) BY MOUTH DAILY   atorvastatin (LIPITOR) 40 MG tablet Take 1 tablet (40 mg total) by mouth daily.   cilostazol (PLETAL) 100 MG tablet TAKE 1 TABLET BY MOUTH TWICE DAILY   clopidogrel (PLAVIX) 75 MG tablet TAKE 1 TABLET(75 MG) BY MOUTH DAILY   lisinopril (ZESTRIL) 5 MG tablet TAKE 1 TABLET(5 MG) BY MOUTH DAILY   Multiple Vitamins-Minerals (MULTIVITAMIN ADULT PO) Take 1 tablet by mouth daily.    Vitamin D3 (VITAMIN D) 25 MCG tablet Take 1 tablet by mouth daily.       05/26/2022    9:49 AM 11/24/2021    9:39 AM 08/30/2021    9:02 AM 05/25/2021    9:09 AM  GAD 7 : Generalized Anxiety Score  Nervous, Anxious, on Edge 0 0 0 0  Control/stop worrying 0 0 0 0  Worry too much - different things 0 0 0 0  Trouble relaxing 0 0 0 0  Restless 0 0 0 0  Easily annoyed or irritable 0 0 0 0  Afraid - awful might happen 0 0 0 0  Total GAD 7 Score 0 0 0 0  Anxiety Difficulty Not difficult at all Not difficult at all Not difficult at all Not difficult at all       11/25/2022    8:56 AM 09/05/2022    9:25 AM 09/05/2022    9:22 AM  Depression screen PHQ 2/9  Decreased Interest 0 0 0  Down, Depressed, Hopeless 0 0 0  PHQ - 2 Score 0 0 0  Altered sleeping 0    Tired, decreased energy 0    Change in appetite 0    Feeling bad or failure about  yourself  0    Trouble concentrating 0    Moving slowly or fidgety/restless 0    Suicidal thoughts 0    PHQ-9 Score 0    Difficult doing work/chores Not difficult at all      BP Readings from Last 3 Encounters:  11/25/22 116/64  09/05/22 110/60  06/27/22 128/66    Physical Exam Vitals and nursing note reviewed.  HENT:     Head: Normocephalic.     Right Ear: Tympanic membrane, ear canal and  external ear normal. There is no impacted cerumen.     Left Ear: Tympanic membrane, ear canal and external ear normal. There is no impacted cerumen.     Nose: Nose normal.     Mouth/Throat:     Mouth: Mucous membranes are moist.  Eyes:     General: No scleral icterus.       Right eye: No discharge.        Left eye: No discharge.     Conjunctiva/sclera: Conjunctivae normal.     Pupils: Pupils are equal, round, and reactive to light.  Neck:     Thyroid: No thyromegaly.     Vascular: No JVD.     Trachea: No tracheal deviation.  Cardiovascular:     Rate and Rhythm: Normal rate and regular rhythm.     Heart sounds: Normal heart sounds. No murmur heard.    No friction rub. No gallop.  Pulmonary:     Effort: No respiratory distress.     Breath sounds: Normal breath sounds. No wheezing or rales.  Abdominal:     General: Bowel sounds are normal.     Palpations: Abdomen is soft. There is no mass.     Tenderness: There is no abdominal tenderness. There is no guarding or rebound.  Musculoskeletal:        General: No tenderness. Normal range of motion.     Cervical back: Normal range of motion and neck supple.  Lymphadenopathy:     Cervical: No cervical adenopathy.  Skin:    General: Skin is warm.     Findings: No rash.  Neurological:     Mental Status: He is alert and oriented to person, place, and time.     Cranial Nerves: No cranial nerve deficit.     Deep Tendon Reflexes: Reflexes are normal and symmetric.     Wt Readings from Last 3 Encounters:  11/25/22 122 lb (55.3 kg)   09/05/22 126 lb (57.2 kg)  06/27/22 126 lb 3.2 oz (57.2 kg)    BP 116/64 (BP Location: Right Arm, Patient Position: Sitting, Cuff Size: Normal)   Pulse 69   Ht 5' 5.5" (1.664 m)   Wt 122 lb (55.3 kg)   SpO2 98%   BMI 19.99 kg/m   Assessment and Plan:  1. Essential hypertension Chronic.  Controlled.  Stable.  Blood pressure 116/64.  Asymptomatic.  Tolerating medication well.  Continue amlodipine 10 mg once a day and lisinopril 5 mg once a day.  Will check CMP for electrolytes and GFR.  Will recheck in 6 months. - amLODipine (NORVASC) 10 MG tablet; TAKE 1 TABLET(10 MG) BY MOUTH DAILY  Dispense: 90 tablet; Refill: 1 - lisinopril (ZESTRIL) 5 MG tablet; Take 1 tablet (5 mg total) by mouth daily.  Dispense: 90 tablet; Refill: 1 - Comprehensive metabolic panel  2. Hyperlipidemia, mixed Chronic.  Controlled.  Stable.  Continue atorvastatin 40 mg once a day.  Will check lipid panel for current level of LDL control. - atorvastatin (LIPITOR) 40 MG tablet; Take 1 tablet (40 mg total) by mouth daily.  Dispense: 90 tablet; Refill: 1 - Lipid Panel With LDL/HDL Ratio  3. PAD (peripheral artery disease) (HCC) Chronic.  Controlled.  Stable.  Control of blood pressure as well as Pletal 100 mg twice a day. - amLODipine (NORVASC) 10 MG tablet; TAKE 1 TABLET(10 MG) BY MOUTH DAILY  Dispense: 90 tablet; Refill: 1 - lisinopril (ZESTRIL) 5 MG tablet; Take 1 tablet (5 mg total) by mouth daily.  Dispense: 90 tablet; Refill: 1  4. Coronary artery disease involving native coronary artery of native heart without angina pectoris As noted above.  Asymptomatic.  Stable.  Continue amlodipine 10 mg once a day and lisinopril 5 mg once a day.  Continue antiplatelet therapy with clopidogrel 75 mg once a day. - amLODipine (NORVASC) 10 MG tablet; TAKE 1 TABLET(10 MG) BY MOUTH DAILY  Dispense: 90 tablet; Refill: 1 - lisinopril (ZESTRIL) 5 MG tablet; Take 1 tablet (5 mg total) by mouth daily.  Dispense: 90 tablet; Refill:  1 - clopidogrel (PLAVIX) 75 MG tablet; Take 1 tablet (75 mg total) by mouth once for 1 dose.  Dispense: 1 tablet; Refill: 0 - Lipid Panel With LDL/HDL Ratio  5. 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  Controlled.  Stable.  Continue combination Pletal and Plavix. - cilostazol (PLETAL) 100 MG tablet; TAKE 1 TABLET BY MOUTH TWICE DAILY  Dispense: 180 tablet; Refill: 0 - clopidogrel (PLAVIX) 75 MG tablet; Take 1 tablet (75 mg total) by mouth once for 1 dose.  Dispense: 1 tablet; Refill: 0  6. Unintentional weight loss New onset.  Patient has had a 4 pound weight loss since July.  Patient attributes this to decreased appetite and food intake.  I do have concerns that given his history of smoking, history of microscopic hematuria that he has not returned to urology for follow-up, history of GIST, and COPD could all be circumstances of concern.  We will first obtain a CBC with differential CMP and TSH and if everything is within reason of these I will consider further workup for the possibility of abdominal CT to evaluate his gas concern.  Patient's been encouraged to return to urology for follow-up of his history of hematuria although he is reluctant due to the past history of swelling of the bladder outlet that required an ER visit for relief.  Patient is at the age range for low-dose CT of the chest and is not experiencing any cough or hemoptysis although that this may need to be addressed as well.  In any event we are seeing patient back in 3 months to recheck weight to see if it is progressed downward if we do not start intervention before. - CBC with Differential/Platelet - Comprehensive metabolic panel - TSH  7. Seasonal allergic rhinitis due to pollen - montelukast (SINGULAIR) 10 MG tablet; Take 1 tablet (10 mg total) by mouth at bedtime.  Dispense: 30 tablet; Refill: 3 Chronic.  Episodic.  Stable.  Would like to avoid antihistamines but will try  leukotriene inhibitor to see if Singulair 10 mg would help with his postnasal drainage. 8. Microscopic hematuria Patient with history of microscopic hematuria who saw Vanna Scotland and had cystoscopy.  This may need to be revisited dipstick noted to have trace nonhemolyzed blood. - POCT Urinalysis Dipstick  9. History of gastrointestinal stromal tumor (GIST) Chronic.  Controlled.  Stable.  Patient had a history of a gastrointestinal stromal tumor which was noted on abdominal CT in 2019 and underwent surgical procedure for removal and was told that it was removed perhaps in entirety and that it was small and nonmalignant.  I do have concerns that this may be an issue that needs further revisiting given his recent weight loss and will consider evaluation with abdominal CT pending lab work. - Comprehensive metabolic panel    Elizabeth Sauer, MD

## 2022-11-26 LAB — LIPID PANEL WITH LDL/HDL RATIO
Cholesterol, Total: 128 mg/dL (ref 100–199)
HDL: 72 mg/dL (ref >39)
LDL Chol Calc (NIH): 44 mg/dL (ref 0–99)
LDL/HDL Ratio: 0.6 {ratio} (ref 0.0–3.6)
Triglycerides: 54 mg/dL (ref 0–149)
VLDL Cholesterol Cal: 12 mg/dL (ref 5–40)

## 2022-11-26 LAB — CBC WITH DIFFERENTIAL/PLATELET
Basophils Absolute: 0.1 10*3/uL (ref 0.0–0.2)
Basos: 1 %
EOS (ABSOLUTE): 0.1 10*3/uL (ref 0.0–0.4)
Eos: 2 %
Hematocrit: 39.8 % (ref 37.5–51.0)
Hemoglobin: 13.2 g/dL (ref 13.0–17.7)
Immature Grans (Abs): 0 10*3/uL (ref 0.0–0.1)
Immature Granulocytes: 0 %
Lymphocytes Absolute: 1.5 10*3/uL (ref 0.7–3.1)
Lymphs: 24 %
MCH: 34.2 pg — ABNORMAL HIGH (ref 26.6–33.0)
MCHC: 33.2 g/dL (ref 31.5–35.7)
MCV: 103 fL — ABNORMAL HIGH (ref 79–97)
Monocytes Absolute: 0.6 10*3/uL (ref 0.1–0.9)
Monocytes: 9 %
Neutrophils Absolute: 4.1 10*3/uL (ref 1.4–7.0)
Neutrophils: 64 %
Platelets: 281 10*3/uL (ref 150–450)
RBC: 3.86 x10E6/uL — ABNORMAL LOW (ref 4.14–5.80)
RDW: 12.1 % (ref 11.6–15.4)
WBC: 6.4 10*3/uL (ref 3.4–10.8)

## 2022-11-26 LAB — COMPREHENSIVE METABOLIC PANEL
ALT: 27 [IU]/L (ref 0–44)
AST: 26 [IU]/L (ref 0–40)
Albumin: 4.4 g/dL (ref 3.7–4.7)
Alkaline Phosphatase: 96 [IU]/L (ref 44–121)
BUN/Creatinine Ratio: 18 (ref 10–24)
BUN: 15 mg/dL (ref 8–27)
Bilirubin Total: 0.3 mg/dL (ref 0.0–1.2)
CO2: 26 mmol/L (ref 20–29)
Calcium: 9.8 mg/dL (ref 8.6–10.2)
Chloride: 103 mmol/L (ref 96–106)
Creatinine, Ser: 0.84 mg/dL (ref 0.76–1.27)
Globulin, Total: 2.1 g/dL (ref 1.5–4.5)
Glucose: 104 mg/dL — ABNORMAL HIGH (ref 70–99)
Potassium: 4.7 mmol/L (ref 3.5–5.2)
Sodium: 141 mmol/L (ref 134–144)
Total Protein: 6.5 g/dL (ref 6.0–8.5)
eGFR: 87 mL/min/{1.73_m2} (ref >59)

## 2022-11-26 LAB — TSH: TSH: 1.4 u[IU]/mL (ref 0.450–4.500)

## 2022-11-29 ENCOUNTER — Other Ambulatory Visit: Payer: Self-pay

## 2022-11-29 DIAGNOSIS — D214 Benign neoplasm of connective and other soft tissue of abdomen: Secondary | ICD-10-CM

## 2022-11-29 DIAGNOSIS — R634 Abnormal weight loss: Secondary | ICD-10-CM

## 2022-12-01 LAB — VITAMIN B12: Vitamin B-12: 913 pg/mL (ref 232–1245)

## 2022-12-01 LAB — SPECIMEN STATUS REPORT

## 2022-12-02 ENCOUNTER — Ambulatory Visit: Payer: Self-pay

## 2022-12-02 NOTE — Progress Notes (Signed)
Called spoke to pts wife let her know that pt can take a multivitamin.   KP

## 2022-12-02 NOTE — Telephone Encounter (Signed)
Summary: B-12 & Folic Acid advice   Pt is calling to report that he was advised to take B-12 & folic aid after his lab results yesterday. Pt would like to know what strength should he take of folic acid and B-12? Or doe they all come in the same strength.         Chief Complaint: Pt. Calling to review how much folic acid to take. PCP recommended OTC or multivitamin. Symptoms: n/a Frequency: n/a Pertinent Negatives: Patient denies  Disposition: [] ED /[] Urgent Care (no appt availability in office) / [] Appointment(In office/virtual)/ []  [facility]/ [x] Home Care/ [] Refused Recommended Disposition /[] [place] [facility]/ []  Follow-up with PCP Additional Notes: Pt. Verbalizes understanding.  Reason for Disposition  Caller has medicine question only, adult not sick, AND triager answers question  Answer Assessment - Initial Assessment Questions 1. NAME of MEDICINE: "What medicine(s) are you calling about?"     Folic acid 2. QUESTION: "What is your question?" (e.g., double dose of medicine, side effect)     How much do I take? 3. PRESCRIBER: "Who prescribed the medicine?" Reason: if prescribed by specialist, call should be referred to that group.     Dr. Yetta Barre recommended OTC or multivitamin. 4. SYMPTOMS: "Do you have any symptoms?" If Yes, ask: "What symptoms are you having?"  "How bad are the symptoms (e.g., mild, moderate, severe)     N/a 5. PREGNANCY:  "Is there any chance that you are pregnant?" "When was your last menstrual period?"     N/a  Protocols used: Medication Question Call-A-AH

## 2022-12-06 ENCOUNTER — Ambulatory Visit
Admission: RE | Admit: 2022-12-06 | Discharge: 2022-12-06 | Disposition: A | Discharge status: Home / Self Care | Payer: Medicare Other | Source: Ambulatory Visit | Attending: Family Medicine | Admitting: Family Medicine

## 2022-12-06 DIAGNOSIS — R634 Abnormal weight loss: Secondary | ICD-10-CM | POA: Insufficient documentation

## 2022-12-06 DIAGNOSIS — I7143 Infrarenal abdominal aortic aneurysm, without rupture: Secondary | ICD-10-CM | POA: Diagnosis not present

## 2022-12-06 DIAGNOSIS — D214 Benign neoplasm of connective and other soft tissue of abdomen: Secondary | ICD-10-CM | POA: Insufficient documentation

## 2022-12-06 DIAGNOSIS — R935 Abnormal findings on diagnostic imaging of other abdominal regions, including retroperitoneum: Secondary | ICD-10-CM | POA: Diagnosis not present

## 2022-12-20 DIAGNOSIS — Z872 Personal history of diseases of the skin and subcutaneous tissue: Secondary | ICD-10-CM | POA: Diagnosis not present

## 2022-12-20 DIAGNOSIS — L578 Other skin changes due to chronic exposure to nonionizing radiation: Secondary | ICD-10-CM | POA: Diagnosis not present

## 2022-12-20 DIAGNOSIS — Z859 Personal history of malignant neoplasm, unspecified: Secondary | ICD-10-CM | POA: Diagnosis not present

## 2022-12-20 DIAGNOSIS — D492 Neoplasm of unspecified behavior of bone, soft tissue, and skin: Secondary | ICD-10-CM | POA: Diagnosis not present

## 2022-12-27 ENCOUNTER — Telehealth: Payer: Self-pay | Admitting: Family Medicine

## 2022-12-27 NOTE — Telephone Encounter (Signed)
Copied from CRM [id number]. Topic: General - Other >> Dec 27, 2022 12:31 PM Ja-Kwan M wrote: Reason for CRM: Pt requests call back to go over imaging results. Cb# [phone]

## 2022-12-28 NOTE — Telephone Encounter (Signed)
Called the reading room and asked them to read patient CT since its been 3 weeks. Will call patient with results.  - Dylan Villanueva

## 2022-12-30 DIAGNOSIS — I10 Essential (primary) hypertension: Secondary | ICD-10-CM | POA: Diagnosis not present

## 2022-12-30 DIAGNOSIS — E782 Mixed hyperlipidemia: Secondary | ICD-10-CM | POA: Diagnosis not present

## 2022-12-30 DIAGNOSIS — F17200 Nicotine dependence, unspecified, uncomplicated: Secondary | ICD-10-CM | POA: Diagnosis not present

## 2022-12-30 DIAGNOSIS — I214 Non-ST elevation (NSTEMI) myocardial infarction: Secondary | ICD-10-CM | POA: Diagnosis not present

## 2022-12-30 DIAGNOSIS — I2510 Atherosclerotic heart disease of native coronary artery without angina pectoris: Secondary | ICD-10-CM | POA: Diagnosis not present

## 2022-12-30 DIAGNOSIS — I70219 Atherosclerosis of native arteries of extremities with intermittent claudication, unspecified extremity: Secondary | ICD-10-CM | POA: Diagnosis not present

## 2022-12-30 DIAGNOSIS — I6523 Occlusion and stenosis of bilateral carotid arteries: Secondary | ICD-10-CM | POA: Diagnosis not present

## 2023-01-02 ENCOUNTER — Telehealth: Payer: Self-pay | Admitting: Family Medicine

## 2023-01-02 NOTE — Telephone Encounter (Signed)
Face-to-face discussion with patient concerning CT pelvic CT results.  Results from the most part were unremarkable and we will recheck patient in 6 weeks.

## 2023-01-11 ENCOUNTER — Other Ambulatory Visit: Payer: Self-pay

## 2023-01-11 DIAGNOSIS — I2510 Atherosclerotic heart disease of native coronary artery without angina pectoris: Secondary | ICD-10-CM

## 2023-01-11 DIAGNOSIS — I10 Essential (primary) hypertension: Secondary | ICD-10-CM

## 2023-01-11 DIAGNOSIS — I739 Peripheral vascular disease, unspecified: Secondary | ICD-10-CM

## 2023-01-11 MED ORDER — LISINOPRIL 5 MG PO TABS: 5.0000 mg | ORAL_TABLET | Freq: Every day | ORAL | 0 refills | Status: DC

## 2023-02-27 ENCOUNTER — Encounter: Payer: Self-pay | Admitting: Family Medicine

## 2023-02-27 ENCOUNTER — Ambulatory Visit (INDEPENDENT_AMBULATORY_CARE_PROVIDER_SITE_OTHER): Payer: Medicare Other | Admitting: Family Medicine

## 2023-02-27 VITALS — BP 118/66 | HR 61 | Ht 65.0 in | Wt 128.8 lb

## 2023-02-27 DIAGNOSIS — J301 Allergic rhinitis due to pollen: Secondary | ICD-10-CM | POA: Diagnosis not present

## 2023-02-27 DIAGNOSIS — E782 Mixed hyperlipidemia: Secondary | ICD-10-CM | POA: Diagnosis not present

## 2023-02-27 DIAGNOSIS — I2510 Atherosclerotic heart disease of native coronary artery without angina pectoris: Secondary | ICD-10-CM | POA: Diagnosis not present

## 2023-02-27 DIAGNOSIS — I739 Peripheral vascular disease, unspecified: Secondary | ICD-10-CM

## 2023-02-27 DIAGNOSIS — I10 Essential (primary) hypertension: Secondary | ICD-10-CM

## 2023-02-27 DIAGNOSIS — I70219 Atherosclerosis of native arteries of extremities with intermittent claudication, unspecified extremity: Secondary | ICD-10-CM

## 2023-02-27 MED ORDER — CILOSTAZOL 100 MG PO TABS: ORAL_TABLET | ORAL | 1 refills | Status: DC

## 2023-02-27 MED ORDER — MONTELUKAST SODIUM 10 MG PO TABS: 10.0000 mg | ORAL_TABLET | Freq: Every day | ORAL | 3 refills | Status: DC

## 2023-02-27 MED ORDER — LISINOPRIL 5 MG PO TABS: 5.0000 mg | ORAL_TABLET | Freq: Every day | ORAL | 1 refills | Status: DC

## 2023-02-27 MED ORDER — ATORVASTATIN CALCIUM 40 MG PO TABS: 40.0000 mg | ORAL_TABLET | Freq: Every day | ORAL | 1 refills | Status: DC

## 2023-02-27 MED ORDER — AMLODIPINE BESYLATE 10 MG PO TABS: ORAL_TABLET | ORAL | 1 refills | Status: DC

## 2023-02-27 MED ORDER — CLOPIDOGREL BISULFATE 75 MG PO TABS: 75.0000 mg | ORAL_TABLET | Freq: Every day | ORAL | 1 refills | Status: DC

## 2023-02-27 NOTE — Progress Notes (Signed)
 Date:  02/27/2023   Name:  Dylan Villanueva   DOB:  1940-04-05   MRN:  [id number]   Chief Complaint: Hypertension, Peripheral Artery Disease, and Hyperlipidemia  Hypertension This is a chronic problem. The current episode started more than 1 year ago. The problem has been gradually improving since onset. The problem is controlled. Pertinent negatives include no anxiety, blurred vision, chest pain, headaches, malaise/fatigue, neck pain, orthopnea, palpitations, peripheral edema, PND, shortness of breath or sweats. There are no associated agents to hypertension. Risk factors for coronary artery disease include dyslipidemia. Past treatments include calcium  channel blockers and ACE inhibitors. The current treatment provides moderate improvement. There are no compliance problems.  Hypertensive end-organ damage includes CAD/MI. There is no history of CVA. There is no history of chronic renal disease, a hypertension causing med or renovascular disease.  Hyperlipidemia This is a chronic problem. The current episode started more than 1 year ago. The problem is controlled. Recent lipid tests were reviewed and are normal. He has no history of chronic renal disease, diabetes, hypothyroidism, liver disease, obesity or nephrotic syndrome. There are no known factors aggravating his hyperlipidemia. Pertinent negatives include no chest pain, focal sensory loss, focal weakness, leg pain, myalgias or shortness of breath. Current antihyperlipidemic treatment includes statins. The current treatment provides moderate improvement of lipids. Risk factors for coronary artery disease include hypertension.  Other This is a chronic (for PAD) problem. Pertinent negatives include no abdominal pain, change in bowel habit, chest pain, congestion, headaches, myalgias, neck pain or sore throat. Associated symptoms comments: Leg fatigue with exertion.    Lab Results  Component Value Date   NA 141 11/25/2022   K 4.7 11/25/2022   CO2  26 11/25/2022   GLUCOSE 104 (H) 11/25/2022   BUN 15 11/25/2022   CREATININE 0.84 11/25/2022   CALCIUM  9.8 11/25/2022   EGFR 87 11/25/2022   GFRNONAA >60 07/06/2021   Lab Results  Component Value Date   CHOL 128 11/25/2022   HDL 72 11/25/2022   LDLCALC 44 11/25/2022   TRIG 54 11/25/2022   CHOLHDL 3.6 05/08/2017   Lab Results  Component Value Date   TSH 1.400 11/25/2022   Lab Results  Component Value Date   HGBA1C 6.0 (H) 05/26/2022   Lab Results  Component Value Date   WBC 6.4 11/25/2022   HGB 13.2 11/25/2022   HCT 39.8 11/25/2022   MCV 103 (H) 11/25/2022   PLT 281 11/25/2022   Lab Results  Component Value Date   ALT 27 11/25/2022   AST 26 11/25/2022   ALKPHOS 96 11/25/2022   BILITOT 0.3 11/25/2022   Lab Results  Component Value Date   VD25OH 36.5 05/08/2017     Review of Systems  Constitutional:  Negative for malaise/fatigue and unexpected weight change.  HENT:  Negative for congestion, postnasal drip, rhinorrhea and sore throat.   Eyes:  Negative for blurred vision, photophobia, redness and visual disturbance.  Respiratory:  Negative for choking, chest tightness, shortness of breath and wheezing.   Cardiovascular:  Negative for chest pain, palpitations, orthopnea and PND.  Gastrointestinal:  Negative for abdominal distention, abdominal pain, anal bleeding and change in bowel habit.  Musculoskeletal:  Negative for myalgias and neck pain.  Neurological:  Negative for focal weakness and headaches.    Patient Active Problem List   Diagnosis Date Noted   Current smoker 04/08/2020   Atherosclerosis of native arteries of extremity with intermittent claudication (HCC) 06/25/2018   Current every day smoker 06/25/2018  Benign gastrointestinal stromal tumor (GIST)    History of colonic polyps    Benign neoplasm of transverse colon    Benign neoplasm of ascending colon    Gastric mass    Bilateral carotid artery stenosis 08/30/2017   Benign essential HTN  05/25/2017   Non-ST elevation myocardial infarction (NSTEMI), subendocardial infarction, subsequent episode of care (HCC) 05/25/2017   Chronic venous insufficiency 05/08/2017   Coronary artery disease involving native coronary artery of native heart 05/08/2017   Hyperlipidemia, mixed 05/08/2017   Hypertension 05/08/2017   History of prostate cancer 05/08/2017   Gait instability 05/08/2017   Vitamin D  deficiency 05/08/2017   Presence of stent in coronary artery in patient with coronary artery disease 05/08/2017   History of shingles 05/08/2017    Allergies  Allergen Reactions   Penicillins Nausea Only    Has patient had a PCN reaction causing immediate rash, facial/tongue/throat swelling, SOB or lightheadedness with hypotension: no Has patient had a PCN reaction causing severe rash involving mucus membranes or skin necrosis: no Has patient had a PCN reaction that required hospitalization: no Has patient had a PCN reaction occurring within the last 10 years: no If all of the above answers are NO, then may proceed with Cephalosporin use.    Sulfa  Antibiotics Diarrhea    Past Surgical History:  Procedure Laterality Date   APPENDECTOMY  1980   CARDIAC SURGERY     COLONOSCOPY WITH PROPOFOL  N/A 03/02/2018   Procedure: COLONOSCOPY WITH PROPOFOL ;  Surgeon: Jinny Carmine, MD;  Location: [facility];  Service: Endoscopy;  Laterality: N/A;   CORONARY ANGIOPLASTY WITH STENT PLACEMENT     CYSTOSCOPY W/ RETROGRADES Bilateral 09/23/2019   Procedure: CYSTOSCOPY WITH RETROGRADE PYELOGRAM;  Surgeon: Penne Knee, MD;  Location: [facility];  Service: Urology;  Laterality: Bilateral;   CYSTOSCOPY WITH URETHRAL DILATATION N/A 09/23/2019   Procedure: CYSTOSCOPY WITH URETHRAL DILATATION;  Surgeon: Penne Knee, MD;  Location: [facility];  Service: Urology;  Laterality: N/A;   ESOPHAGOGASTRODUODENOSCOPY N/A 11/07/2017   Procedure: ESOPHAGOGASTRODUODENOSCOPY (EGD);  Surgeon: Jordis Laneta FALCON, MD;   Location: [facility];  Service: General;  Laterality: N/A;   ESOPHAGOGASTRODUODENOSCOPY (EGD) WITH PROPOFOL   03/02/2018   Procedure: ESOPHAGOGASTRODUODENOSCOPY (EGD) WITH PROPOFOL ;  Surgeon: Jinny Carmine, MD;  Location: [facility];  Service: Endoscopy;;   EUS N/A 07/13/2017   Procedure: FULL UPPER ENDOSCOPIC ULTRASOUND (EUS) RADIAL;  Surgeon: Glena Mt, MD;  Location: [facility];  Service: Endoscopy;  Laterality: N/A;   HIGH DOSE RADIATION IMPLANT INSERTION  2006   LAPAROSCOPIC REMOVAL ABDOMINAL MASS N/A 11/07/2017   Procedure: LAPAROSCOPIC GASTRIC MASS RESECTION;  Surgeon: Jordis Laneta FALCON, MD;  Location: [facility];  Service: General;  Laterality: N/A;   LOWER EXTREMITY ANGIOGRAPHY Left 07/04/2018   Procedure: LOWER EXTREMITY ANGIOGRAPHY;  Surgeon: Jama Cordella MATSU, MD;  Location: [facility];  Service: Cardiovascular;  Laterality: Left;   LOWER EXTREMITY ANGIOGRAPHY Left 08/10/2018   Procedure: LOWER EXTREMITY ANGIOGRAPHY;  Surgeon: Jama Cordella MATSU, MD;  Location: [facility];  Service: Cardiovascular;  Laterality: Left;   LOWER EXTREMITY ANGIOGRAPHY Left 09/12/2018   Procedure: LOWER EXTREMITY ANGIOGRAPHY;  Surgeon: Jama Cordella MATSU, MD;  Location: [facility];  Service: Cardiovascular;  Laterality: Left;   POLYPECTOMY  03/02/2018   Procedure: POLYPECTOMY;  Surgeon: Jinny Carmine, MD;  Location: [facility];  Service: Endoscopy;;   TRANSURETHRAL RESECTION OF BLADDER TUMOR N/A 09/23/2019   Procedure: TRANSURETHRAL RESECTION OF BLADDER TUMOR (TURBT);  Surgeon: Penne Knee,  MD;  Location: [facility];  Service: Urology;  Laterality: N/A;    Social History   Tobacco Use   Smoking status: Every Day    Current packs/day: 1.00    Average packs/day: 1 pack/day for 70.3 years (70.3 ttl pk-yrs)    Types: Cigarettes    Start date: 11/18/1952   Smokeless tobacco: Never   Tobacco comments:         Vaping Use   Vaping status: Never Used  Substance Use  Topics   Alcohol use: Yes    Alcohol/week: 1.0 standard drink of alcohol    Types: 1 Cans of beer per week    Comment: occasional   Drug use: Never     Medication list has been reviewed and updated.  Current Meds  Medication Sig   amLODipine  (NORVASC ) 10 MG tablet TAKE 1 TABLET(10 MG) BY MOUTH DAILY   atorvastatin  (LIPITOR) 40 MG tablet Take 1 tablet (40 mg total) by mouth daily.   cilostazol  (PLETAL ) 100 MG tablet TAKE 1 TABLET BY MOUTH TWICE DAILY   clopidogrel  (PLAVIX ) 75 MG tablet Take 1 tablet (75 mg total) by mouth daily.   folic acid  (FOLVITE ) 1 MG tablet Take 1 mg by mouth daily.   lisinopril  (ZESTRIL ) 5 MG tablet Take 1 tablet (5 mg total) by mouth daily.   montelukast  (SINGULAIR ) 10 MG tablet Take 1 tablet (10 mg total) by mouth at bedtime.   Multiple Vitamins-Minerals (MULTIVITAMIN ADULT PO) Take 1 tablet by mouth daily.    vitamin B-12 (CYANOCOBALAMIN ) 100 MCG tablet Take 100 mcg by mouth daily.   Vitamin D3 (VITAMIN D ) 25 MCG tablet Take 1 tablet by mouth daily.       02/27/2023   10:04 AM 05/26/2022    9:49 AM 11/24/2021    9:39 AM 08/30/2021    9:02 AM  GAD 7 : Generalized Anxiety Score  Nervous, Anxious, on Edge 0 0 0 0  Control/stop worrying 0 0 0 0  Worry too much - different things 0 0 0 0  Trouble relaxing 0 0 0 0  Restless 0 0 0 0  Easily annoyed or irritable 0 0 0 0  Afraid - awful might happen 0 0 0 0  Total GAD 7 Score 0 0 0 0  Anxiety Difficulty Not difficult at all Not difficult at all Not difficult at all Not difficult at all       02/27/2023   10:03 AM 11/25/2022    8:56 AM 09/05/2022    9:25 AM  Depression screen PHQ 2/9  Decreased Interest 0 0 0  Down, Depressed, Hopeless 0 0 0  PHQ - 2 Score 0 0 0  Altered sleeping 0 0   Tired, decreased energy 0 0   Change in appetite 0 0   Feeling bad or failure about yourself  0 0   Trouble concentrating 0 0   Moving slowly or fidgety/restless 0 0   Suicidal thoughts 0 0   PHQ-9 Score 0 0    Difficult doing work/chores Not difficult at all Not difficult at all     BP Readings from Last 3 Encounters:  02/27/23 118/66  11/25/22 116/64  09/05/22 110/60    Physical Exam Vitals reviewed.  Constitutional:      Appearance: He is well-developed.  HENT:     Head: Normocephalic and atraumatic.     Right Ear: Tympanic membrane, ear canal and external ear normal.     Left Ear: Tympanic membrane, ear canal  and external ear normal.     Nose: Nose normal.     Mouth/Throat:     Mouth: Mucous membranes are dry.     Dentition: Normal dentition.     Pharynx: No oropharyngeal exudate or posterior oropharyngeal erythema.  Eyes:     General: Lids are normal. No scleral icterus.    Conjunctiva/sclera: Conjunctivae normal.     Pupils: Pupils are equal, round, and reactive to light.  Neck:     Thyroid : No thyromegaly.     Vascular: No carotid bruit, hepatojugular reflux or JVD.     Trachea: No tracheal deviation.  Cardiovascular:     Rate and Rhythm: Normal rate and regular rhythm.     Pulses: Normal pulses.     Heart sounds: Normal heart sounds and S1 normal.     No systolic murmur is present.     No diastolic murmur is present.     No S3 or S4 sounds.  Pulmonary:     Effort: Pulmonary effort is normal.     Breath sounds: Normal breath sounds.  Abdominal:     General: Bowel sounds are normal. There is no distension.     Palpations: Abdomen is soft. There is no hepatomegaly, splenomegaly or mass.     Tenderness: There is no abdominal tenderness.     Hernia: There is no hernia in the left inguinal area.  Musculoskeletal:        General: Normal range of motion.     Cervical back: Normal range of motion and neck supple.     Right lower leg: No edema.     Left lower leg: No edema.  Lymphadenopathy:     Cervical: No cervical adenopathy.  Skin:    General: Skin is warm and dry.     Findings: No rash.  Neurological:     Mental Status: He is alert and oriented to person, place,  and time.     Sensory: No sensory deficit.     Deep Tendon Reflexes: Reflexes are normal and symmetric.  Psychiatric:        Mood and Affect: Mood is not anxious or depressed.     Wt Readings from Last 3 Encounters:  02/27/23 128 lb 12.8 oz (58.4 kg)  11/25/22 122 lb (55.3 kg)  09/05/22 126 lb (57.2 kg)    BP 118/66   Pulse 61   Ht 5' 5 (1.651 m)   Wt 128 lb 12.8 oz (58.4 kg)   SpO2 100%   BMI 21.43 kg/m   Assessment and Plan: 1. Essential hypertension (Primary) Chronic.  Controlled.  Stable.  Blood pressure 118/66.  Asymptomatic.  Tolerating medication well.  Continue amlodipine  10 mg once a day and lisinopril  5 mg once a day.  Will recheck in 6 months.  Review of previous labs are acceptable at this time - amLODipine  (NORVASC ) 10 MG tablet; TAKE 1 TABLET(10 MG) BY MOUTH DAILY  Dispense: 90 tablet; Refill: 1 - lisinopril  (ZESTRIL ) 5 MG tablet; Take 1 tablet (5 mg total) by mouth daily.  Dispense: 90 tablet; Refill: 1  2. PAD (peripheral artery disease) (HCC) Chronic.  Controlled.  Stable.  Pulses are excellent in the left foot diminished but present in the right foot.  Patient is doing well symptomatology without claudication but does have some fatigue in the legs when walking distances.  Will continue control of symptomatology with Plavix  75 mg once a day and Pletal  100 mg twice a day.  Symptomatology is also controlled by  controlling blood pressure which includes use of lisinopril  5 mg once a day and noted pain 10 mg once a day. - amLODipine  (NORVASC ) 10 MG tablet; TAKE 1 TABLET(10 MG) BY MOUTH DAILY  Dispense: 90 tablet; Refill: 1 - clopidogrel  (PLAVIX ) 75 MG tablet; Take 1 tablet (75 mg total) by mouth daily.  Dispense: 90 tablet; Refill: 1 - lisinopril  (ZESTRIL ) 5 MG tablet; Take 1 tablet (5 mg total) by mouth daily.  Dispense: 90 tablet; Refill: 1  3. Coronary artery disease involving native coronary artery of native heart without angina pectoris Followed by cardiology will  continue above dosings of antiplatelet therapy and blood pressure control. - amLODipine  (NORVASC ) 10 MG tablet; TAKE 1 TABLET(10 MG) BY MOUTH DAILY  Dispense: 90 tablet; Refill: 1 - clopidogrel  (PLAVIX ) 75 MG tablet; Take 1 tablet (75 mg total) by mouth daily.  Dispense: 90 tablet; Refill: 1 - lisinopril  (ZESTRIL ) 5 MG tablet; Take 1 tablet (5 mg total) by mouth daily.  Dispense: 90 tablet; Refill: 1  4. Hyperlipidemia, mixed .  Controlled.  Stable.  In addition to low-cholesterol low triglyceride dietary guidelines which have been provided will continue atorvastatin  40 mg once a day. - atorvastatin  (LIPITOR) 40 MG tablet; Take 1 tablet (40 mg total) by mouth daily.  Dispense: 90 tablet; Refill: 1  5. 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 - cilostazol  (PLETAL ) 100 MG tablet; TAKE 1 TABLET BY MOUTH TWICE DAILY  Dispense: 180 tablet; Refill: 1 - clopidogrel  (PLAVIX ) 75 MG tablet; Take 1 tablet (75 mg total) by mouth daily.  Dispense: 90 tablet; Refill: 1  6. Seasonal allergic rhinitis due to pollen Chronic.  Controlled.  Stable.  Seasonal allergies controlled with Singulair  10 mg once a day. - montelukast  (SINGULAIR ) 10 MG tablet; Take 1 tablet (10 mg total) by mouth at bedtime.  Dispense: 30 tablet; Refill: 3     Cathryne Molt, MD

## 2023-06-25 NOTE — Progress Notes (Unsigned)
 MRN : [id number]  Dylan Villanueva is a 83 y.o. (October 13, 1940) male who presents with chief complaint of check circulation.  History of Present Illness:   The patient returns to the office for followup and review status post angiogram with intervention on 09/12/2018.    Procedure(s) Performed:             1.   Introduction catheter into left lower extremity 3rd order catheter placement from right femoral approach             2.   Introduction catheter into left lower extremity third order catheter placement from left ankle approach             3.   Ultrasound-guided access to the right common femoral artery and left posterior tibial artery artery             4.   Crosser atherectomy with percutaneous transluminal angioplasty and stent placement left superficial femoral and popliteal arteries             4.  Percutaneous transluminal angioplasty left proximal posterior tibial and tibial peroneal trunk             5.  Percutaneous transluminal angioplasty left external iliac artery secondary to in-stent restenosis             6.  Star close closure right common femoral arteriotomy and manual pressure held left posterior tibial at the ankle   The patient notes improvement in the lower extremity symptoms. No interval shortening of the patient's claudication distance or rest pain symptoms. No new ulcers or wounds have occurred since the last visit.   There have been no significant changes to the patient's overall health care.   The patient denies amaurosis fugax or recent TIA symptoms. There are no recent neurological changes noted. The patient denies history of DVT, PE or superficial thrombophlebitis. The patient denies recent episodes of angina or shortness of breath.    ABI's Rt=1.01 and Lt=0.99  (previous ABI's Rt=0.88 and Lt=0.96)   Previous duplex ultrasound of the left SFA mild to moderate stenosis of the proximal SFA,  the SFA stent is widely patent  No outpatient medications have been marked as taking for the 06/26/23 encounter (Appointment) with Prescilla Brod, Ninette Basque, MD.    Past Medical History:  Diagnosis Date   Cancer [facility]) 2006   prostate    Colon polyps    Coronary artery disease    Dyspnea    With exertion climbing up hill   Hyperlipidemia    Hypertension    Myocardial infarction [facility])    Prostate CA (HCC)    Smoker    Squamous acanthoma of external ear, right     Past Surgical History:  Procedure Laterality Date   APPENDECTOMY  1980   CARDIAC SURGERY     COLONOSCOPY WITH PROPOFOL  N/A 03/02/2018   Procedure: COLONOSCOPY WITH PROPOFOL ;  Surgeon: Marnee Sink, MD;  Location: [facility];  Service: Endoscopy;  Laterality: N/A;   CORONARY ANGIOPLASTY WITH STENT PLACEMENT     CYSTOSCOPY W/ RETROGRADES Bilateral 09/23/2019  Procedure: CYSTOSCOPY WITH RETROGRADE PYELOGRAM;  Surgeon: Dustin Gimenez, MD;  Location: [facility] ORS;  Service: Urology;  Laterality: Bilateral;   CYSTOSCOPY WITH URETHRAL DILATATION N/A 09/23/2019   Procedure: CYSTOSCOPY WITH URETHRAL DILATATION;  Surgeon: Dustin Gimenez, MD;  Location: [facility] ORS;  Service: Urology;  Laterality: N/A;   ESOPHAGOGASTRODUODENOSCOPY N/A 11/07/2017   Procedure: ESOPHAGOGASTRODUODENOSCOPY (EGD);  Surgeon: Alben Alma, MD;  Location: [facility] ORS;  Service: General;  Laterality: N/A;   ESOPHAGOGASTRODUODENOSCOPY (EGD) WITH PROPOFOL   03/02/2018   Procedure: ESOPHAGOGASTRODUODENOSCOPY (EGD) WITH PROPOFOL ;  Surgeon: Marnee Sink, MD;  Location: [facility];  Service: Endoscopy;;   EUS N/A 07/13/2017   Procedure: FULL UPPER ENDOSCOPIC ULTRASOUND (EUS) RADIAL;  Surgeon: Creola Doheny, MD;  Location: [facility] ENDOS[facility];  Service: Endoscopy;  Laterality: N/A;   HIGH DOSE RADIATION IMPLANT INSERTION  2006   LAPAROSCOPIC REMOVAL ABDOMINAL MASS N/A 11/07/2017   Procedure: LAPAROSCOPIC GASTRIC MASS RESECTION;  Surgeon: Alben Alma, MD;  Location: [facility]  ORS;  Service: General;  Laterality: N/A;   LOWER EXTREMITY ANGIOGRAPHY Left 07/04/2018   Procedure: LOWER EXTREMITY ANGIOGRAPHY;  Surgeon: Jackquelyn Mass, MD;  Location: [facility];  Service: Cardiovascular;  Laterality: Left;   LOWER EXTREMITY ANGIOGRAPHY Left 08/10/2018   Procedure: LOWER EXTREMITY ANGIOGRAPHY;  Surgeon: Jackquelyn Mass, MD;  Location: [facility];  Service: Cardiovascular;  Laterality: Left;   LOWER EXTREMITY ANGIOGRAPHY Left 09/12/2018   Procedure: LOWER EXTREMITY ANGIOGRAPHY;  Surgeon: Jackquelyn Mass, MD;  Location: [facility];  Service: Cardiovascular;  Laterality: Left;   POLYPECTOMY  03/02/2018   Procedure: POLYPECTOMY;  Surgeon: Marnee Sink, MD;  Location: [facility];  Service: Endoscopy;;   TRANSURETHRAL RESECTION OF BLADDER TUMOR N/A 09/23/2019   Procedure: TRANSURETHRAL RESECTION OF BLADDER TUMOR (TURBT);  Surgeon: Dustin Gimenez, MD;  Location: [facility] ORS;  Service: Urology;  Laterality: N/A;    Social History Social History   Tobacco Use   Smoking status: Every Day    Current packs/day: 1.00    Average packs/day: 1 pack/day for 70.6 years (70.6 ttl pk-yrs)    Types: Cigarettes    Start date: 11/18/1952   Smokeless tobacco: Never   Tobacco comments:         Vaping Use   Vaping status: Never Used  Substance Use Topics   Alcohol use: Yes    Alcohol/week: 1.0 standard drink of alcohol    Types: 1 Cans of beer per week    Comment: occasional   Drug use: Never    Family History Family History  Problem Relation Age of Onset   Cancer Mother        pancreatic   Dementia Father    Cancer Sister        breast    Aneurysm Brother    Bladder Cancer Neg Hx    Prostate cancer Neg Hx    Kidney cancer Neg Hx     Allergies  Allergen Reactions   Penicillins Nausea Only    Has patient had a PCN reaction causing immediate rash, facial/tongue/throat swelling, SOB or lightheadedness with hypotension: no Has  patient had a PCN reaction causing severe rash involving mucus membranes or skin necrosis: no Has patient had a PCN reaction that required hospitalization: no Has patient had a PCN reaction occurring within the last 10 years: no If all of the above answers are "NO", then may proceed with Cephalosporin use.    Sulfa  Antibiotics Diarrhea     REVIEW OF SYSTEMS (  Negative unless checked)  Constitutional: [] Weight loss  [] Fever  [] Chills Cardiac: [] Chest pain   [] Chest pressure   [] Palpitations   [] Shortness of breath when laying flat   [] Shortness of breath with exertion. Vascular:  [x] Pain in legs with walking   [] Pain in legs at rest  [] History of DVT   [] Phlebitis   [] Swelling in legs   [] Varicose veins   [] Non-healing ulcers Pulmonary:   [] Uses home oxygen   [] Productive cough   [] Hemoptysis   [] Wheeze  [] COPD   [] Asthma Neurologic:  [] Dizziness   [] Seizures   [] History of stroke   [] History of TIA  [] Aphasia   [] Vissual changes   [] Weakness or numbness in arm   [] Weakness or numbness in leg Musculoskeletal:   [] Joint swelling   [] Joint pain   [] Low back pain Hematologic:  [] Easy bruising  [] Easy bleeding   [] Hypercoagulable state   [] Anemic Gastrointestinal:  [] Diarrhea   [] Vomiting  [] Gastroesophageal reflux/heartburn   [] Difficulty swallowing. Genitourinary:  [] Chronic kidney disease   [] Difficult urination  [] Frequent urination   [] Blood in urine Skin:  [] Rashes   [] Ulcers  Psychological:  [] History of anxiety   []  History of major depression.  Physical Examination  There were no vitals filed for this visit. There is no height or weight on file to calculate BMI. Gen: WD/WN, NAD Head: [place]/AT, No temporalis wasting.  Ear/Nose/Throat: Hearing grossly intact, nares w/o erythema or drainage Eyes: PER, EOMI, sclera nonicteric.  Neck: Supple, no masses.  No bruit or JVD.  Pulmonary:  Good air movement, no audible wheezing, no use of accessory muscles.  Cardiac: RRR, normal S1, S2, no  Murmurs. Vascular:  mild trophic changes, no open wounds patient has a technically on his right calf with erythema Vessel Right Left  Radial Palpable Palpable  PT Not Palpable Not Palpable  DP Not Palpable Not Palpable  Gastrointestinal: soft, non-distended. No guarding/no peritoneal signs.  Musculoskeletal: M/S 5/5 throughout.  No visible deformity.  Neurologic: CN 2-12 intact. Pain and light touch intact in extremities.  Symmetrical.  Speech is fluent. Motor exam as listed above. Psychiatric: Judgment intact, Mood & affect appropriate for pt's clinical situation. Dermatologic: No rashes or ulcers noted.  No changes consistent with cellulitis.   CBC Lab Results  Component Value Date   WBC 6.4 11/25/2022   HGB 13.2 11/25/2022   HCT 39.8 11/25/2022   MCV 103 (H) 11/25/2022   PLT 281 11/25/2022    BMET    Component Value Date/Time   NA 141 11/25/2022 1032   K 4.7 11/25/2022 1032   CL 103 11/25/2022 1032   CO2 26 11/25/2022 1032   GLUCOSE 104 (H) 11/25/2022 1032   GLUCOSE 133 (H) 07/06/2021 2049   BUN 15 11/25/2022 1032   CREATININE 0.84 11/25/2022 1032   CALCIUM  9.8 11/25/2022 1032   GFRNONAA >60 07/06/2021 2049   GFRAA >60 09/16/2019 1032   CrCl cannot be calculated (Patient's most recent lab result is older than the maximum 21 days allowed.).  COAG Lab Results  Component Value Date   INR 0.98 11/02/2017    Radiology No results found.   Assessment/Plan 1. Atherosclerosis of native artery of both lower extremities with intermittent claudication (HCC) (Primary) Recommend:   The patient is status post successful angiogram with intervention.  The patient reports that the claudication symptoms and leg pain has improved.   The patient denies lifestyle limiting changes at this point in time.   No further invasive studies, angiography or surgery at  this time The patient should continue walking and begin a more formal exercise program.  The patient should continue  antiplatelet therapy and aggressive treatment of the lipid abnormalities   Continued surveillance is indicated as atherosclerosis is likely to progress with time.     Patient should undergo noninvasive studies as ordered. The patient will follow up with me to review the studies.  - VAS US  ABI WITH/WO TBI; Future  2. Bilateral carotid artery stenosis Recommend:  Given the patient's asymptomatic subcritical stenosis no further invasive testing or surgery at this time.  Continue antiplatelet therapy as prescribed Continue management of CAD, HTN and Hyperlipidemia Healthy heart diet,  encouraged exercise at least 4 times per week  Follow up in 12 months with duplex ultrasound and physical exam   - VAS US  CAROTID; Future  3. Chronic venous insufficiency Recommend:   No surgery or intervention at this point in time.   I have reviewed my discussion with the patient regarding venous insufficiency and why it causes symptoms. I have discussed with the patient the chronic skin changes that accompany venous insufficiency and the long term sequela such as ulceration. Patient will contnue wearing graduated compression stockings on a daily basis, as this has provided excellent control of his edema. The patient will put the stockings on first thing in the morning and removing them in the evening. The patient is reminded not to sleep in the stockings.   In addition, behavioral modification including elevation during the day will be initiated. Exercise is strongly encouraged.   Previous duplex ultrasound of the lower extremities shows normal deep system, no significant superficial reflux was identified.   Given the patient's good control and lack of any problems regarding the venous insufficiency and lymphedema a lymph pump in not need at this time.    4. Benign essential HTN Continue antihypertensive medications as already ordered, these medications have been reviewed and there are no changes at  this time.  5. Coronary artery disease involving native coronary artery of native heart without angina pectoris Continue cardiac and antihypertensive medications as already ordered and reviewed, no changes at this time.  Continue statin as ordered and reviewed, no changes at this time  Nitrates PRN for chest pain  6. Tick bite of right lower leg, sequela Patient has a tick bite with some erythema I will prescribe a course of doxycycline.    Devon Fogo, MD  06/25/2023 2:32 PM

## 2023-06-26 ENCOUNTER — Encounter (INDEPENDENT_AMBULATORY_CARE_PROVIDER_SITE_OTHER): Payer: Self-pay | Admitting: Vascular Surgery

## 2023-06-26 ENCOUNTER — Ambulatory Visit (INDEPENDENT_AMBULATORY_CARE_PROVIDER_SITE_OTHER): Payer: Medicare Other | Admitting: Vascular Surgery

## 2023-06-26 ENCOUNTER — Ambulatory Visit (INDEPENDENT_AMBULATORY_CARE_PROVIDER_SITE_OTHER): Payer: Medicare Other

## 2023-06-26 VITALS — BP 132/68 | HR 67 | Resp 18 | Ht 65.0 in | Wt 139.6 lb

## 2023-06-26 DIAGNOSIS — I2510 Atherosclerotic heart disease of native coronary artery without angina pectoris: Secondary | ICD-10-CM | POA: Diagnosis not present

## 2023-06-26 DIAGNOSIS — I10 Essential (primary) hypertension: Secondary | ICD-10-CM

## 2023-06-26 DIAGNOSIS — I872 Venous insufficiency (chronic) (peripheral): Secondary | ICD-10-CM

## 2023-06-26 DIAGNOSIS — I6523 Occlusion and stenosis of bilateral carotid arteries: Secondary | ICD-10-CM | POA: Diagnosis not present

## 2023-06-26 DIAGNOSIS — W57XXXA Bitten or stung by nonvenomous insect and other nonvenomous arthropods, initial encounter: Secondary | ICD-10-CM | POA: Insufficient documentation

## 2023-06-26 DIAGNOSIS — I70213 Atherosclerosis of native arteries of extremities with intermittent claudication, bilateral legs: Secondary | ICD-10-CM

## 2023-06-26 DIAGNOSIS — S80861S Insect bite (nonvenomous), right lower leg, sequela: Secondary | ICD-10-CM

## 2023-06-26 MED ORDER — DOXYCYCLINE HYCLATE 100 MG PO CAPS: 100.0000 mg | ORAL_CAPSULE | Freq: Two times a day (BID) | ORAL | 0 refills | Status: DC

## 2023-06-28 DIAGNOSIS — L578 Other skin changes due to chronic exposure to nonionizing radiation: Secondary | ICD-10-CM | POA: Diagnosis not present

## 2023-06-28 DIAGNOSIS — Z872 Personal history of diseases of the skin and subcutaneous tissue: Secondary | ICD-10-CM | POA: Diagnosis not present

## 2023-06-28 DIAGNOSIS — L570 Actinic keratosis: Secondary | ICD-10-CM | POA: Diagnosis not present

## 2023-06-28 DIAGNOSIS — Z859 Personal history of malignant neoplasm, unspecified: Secondary | ICD-10-CM | POA: Diagnosis not present

## 2023-06-28 LAB — VAS US ABI WITH/WO TBI
Left ABI: 0.99
Right ABI: 1.01

## 2023-07-04 ENCOUNTER — Encounter: Payer: Self-pay | Admitting: Family Medicine

## 2023-07-04 ENCOUNTER — Ambulatory Visit (INDEPENDENT_AMBULATORY_CARE_PROVIDER_SITE_OTHER): Payer: Self-pay | Admitting: Family Medicine

## 2023-07-04 VITALS — BP 100/50 | HR 67 | Ht 65.0 in | Wt 127.0 lb

## 2023-07-04 DIAGNOSIS — I739 Peripheral vascular disease, unspecified: Secondary | ICD-10-CM | POA: Diagnosis not present

## 2023-07-04 DIAGNOSIS — R351 Nocturia: Secondary | ICD-10-CM | POA: Diagnosis not present

## 2023-07-04 DIAGNOSIS — I2510 Atherosclerotic heart disease of native coronary artery without angina pectoris: Secondary | ICD-10-CM

## 2023-07-04 DIAGNOSIS — J301 Allergic rhinitis due to pollen: Secondary | ICD-10-CM

## 2023-07-04 DIAGNOSIS — I10 Essential (primary) hypertension: Secondary | ICD-10-CM

## 2023-07-04 DIAGNOSIS — E782 Mixed hyperlipidemia: Secondary | ICD-10-CM | POA: Diagnosis not present

## 2023-07-04 DIAGNOSIS — R7303 Prediabetes: Secondary | ICD-10-CM | POA: Diagnosis not present

## 2023-07-04 MED ORDER — MONTELUKAST SODIUM 10 MG PO TABS: 10.0000 mg | ORAL_TABLET | Freq: Every day | ORAL | 1 refills | Status: DC

## 2023-07-04 MED ORDER — AMLODIPINE BESYLATE 10 MG PO TABS: ORAL_TABLET | ORAL | 1 refills | Status: DC

## 2023-07-04 MED ORDER — CLOPIDOGREL BISULFATE 75 MG PO TABS
75.0000 mg | ORAL_TABLET | Freq: Every day | ORAL | 1 refills | Status: DC
Start: 2023-07-04 — End: 2023-11-17

## 2023-07-04 MED ORDER — LISINOPRIL 5 MG PO TABS
5.0000 mg | ORAL_TABLET | Freq: Every day | ORAL | 1 refills | Status: DC
Start: 2023-07-04 — End: 2023-11-17

## 2023-07-04 MED ORDER — ATORVASTATIN CALCIUM 40 MG PO TABS
40.0000 mg | ORAL_TABLET | Freq: Every day | ORAL | 1 refills | Status: DC
Start: 2023-07-04 — End: 2023-11-17

## 2023-07-04 MED ORDER — CILOSTAZOL 100 MG PO TABS: ORAL_TABLET | ORAL | 1 refills | Status: DC

## 2023-07-04 NOTE — Progress Notes (Signed)
 Date:  07/04/2023   Name:  Dylan Villanueva   DOB:  1940-11-01   MRN:  [id number]   Chief Complaint: Annual Exam (Patient presents today for his annual exam. He is doing well today and has no concerns for today's visit. )  HPI  Lab Results  Component Value Date   NA 141 11/25/2022   K 4.7 11/25/2022   CO2 26 11/25/2022   GLUCOSE 104 (H) 11/25/2022   BUN 15 11/25/2022   CREATININE 0.84 11/25/2022   CALCIUM  9.8 11/25/2022   EGFR 87 11/25/2022   GFRNONAA >60 07/06/2021   Lab Results  Component Value Date   CHOL 128 11/25/2022   HDL 72 11/25/2022   LDLCALC 44 11/25/2022   TRIG 54 11/25/2022   CHOLHDL 3.6 05/08/2017   Lab Results  Component Value Date   TSH 1.400 11/25/2022   Lab Results  Component Value Date   HGBA1C 6.0 (H) 05/26/2022   Lab Results  Component Value Date   WBC 6.4 11/25/2022   HGB 13.2 11/25/2022   HCT 39.8 11/25/2022   MCV 103 (H) 11/25/2022   PLT 281 11/25/2022   Lab Results  Component Value Date   ALT 27 11/25/2022   AST 26 11/25/2022   ALKPHOS 96 11/25/2022   BILITOT 0.3 11/25/2022   Lab Results  Component Value Date   VD25OH 36.5 05/08/2017     Review of Systems  Constitutional:  Negative for chills and fever.  HENT:  Negative for drooling, ear discharge, ear pain and sore throat.   Respiratory:  Negative for cough, shortness of breath and wheezing.   Cardiovascular:  Negative for chest pain, palpitations and leg swelling.  Gastrointestinal:  Negative for abdominal pain, blood in stool, constipation, diarrhea and nausea.  Endocrine: Negative for polydipsia.  Genitourinary:  Negative for dysuria, frequency, hematuria and urgency.  Musculoskeletal:  Negative for back pain, myalgias and neck pain.  Skin:  Negative for rash.  Allergic/Immunologic: Negative for environmental allergies.  Neurological:  Negative for dizziness and headaches.  Hematological:  Does not bruise/bleed easily.  Psychiatric/Behavioral:  Negative for suicidal  ideas. The patient is not nervous/anxious.     Patient Active Problem List   Diagnosis Date Noted   Tick bite 06/26/2023   Current smoker 04/08/2020   Atherosclerosis of native arteries of extremity with intermittent claudication (HCC) 06/25/2018   Current every day smoker 06/25/2018   Benign gastrointestinal stromal tumor (GIST)    History of colonic polyps    Benign neoplasm of transverse colon    Benign neoplasm of ascending colon    Gastric mass    Bilateral carotid artery stenosis 08/30/2017   Benign essential HTN 05/25/2017   Non-ST elevation myocardial infarction (NSTEMI), subendocardial infarction, subsequent episode of care (HCC) 05/25/2017   Chronic venous insufficiency 05/08/2017   Coronary artery disease involving native coronary artery of native heart 05/08/2017   Hyperlipidemia, mixed 05/08/2017   Hypertension 05/08/2017   History of prostate cancer 05/08/2017   Gait instability 05/08/2017   Vitamin D  deficiency 05/08/2017   Presence of stent in coronary artery in patient with coronary artery disease 05/08/2017   History of shingles 05/08/2017    Allergies  Allergen Reactions   Penicillins Nausea Only    Has patient had a PCN reaction causing immediate rash, facial/tongue/throat swelling, SOB or lightheadedness with hypotension: no Has patient had a PCN reaction causing severe rash involving mucus membranes or skin necrosis: no Has patient had a PCN reaction that required hospitalization:  no Has patient had a PCN reaction occurring within the last 10 years: no If all of the above answers are "NO", then may proceed with Cephalosporin use.    Sulfa  Antibiotics Diarrhea    Past Surgical History:  Procedure Laterality Date   APPENDECTOMY  1980   CARDIAC SURGERY     COLONOSCOPY WITH PROPOFOL  N/A 03/02/2018   Procedure: COLONOSCOPY WITH PROPOFOL ;  Surgeon: Marnee Sink, MD;  Location: [facility];  Service: Endoscopy;  Laterality: N/A;   CORONARY  ANGIOPLASTY WITH STENT PLACEMENT     CYSTOSCOPY W/ RETROGRADES Bilateral 09/23/2019   Procedure: CYSTOSCOPY WITH RETROGRADE PYELOGRAM;  Surgeon: Dustin Gimenez, MD;  Location: [facility];  Service: Urology;  Laterality: Bilateral;   CYSTOSCOPY WITH URETHRAL DILATATION N/A 09/23/2019   Procedure: CYSTOSCOPY WITH URETHRAL DILATATION;  Surgeon: Dustin Gimenez, MD;  Location: [facility];  Service: Urology;  Laterality: N/A;   ESOPHAGOGASTRODUODENOSCOPY N/A 11/07/2017   Procedure: ESOPHAGOGASTRODUODENOSCOPY (EGD);  Surgeon: Alben Alma, MD;  Location: [facility];  Service: General;  Laterality: N/A;   ESOPHAGOGASTRODUODENOSCOPY (EGD) WITH PROPOFOL   03/02/2018   Procedure: ESOPHAGOGASTRODUODENOSCOPY (EGD) WITH PROPOFOL ;  Surgeon: Marnee Sink, MD;  Location: [facility];  Service: Endoscopy;;   EUS N/A 07/13/2017   Procedure: FULL UPPER ENDOSCOPIC ULTRASOUND (EUS) RADIAL;  Surgeon: Creola Doheny, MD;  Location: [facility];  Service: Endoscopy;  Laterality: N/A;   HIGH DOSE RADIATION IMPLANT INSERTION  2006   LAPAROSCOPIC REMOVAL ABDOMINAL MASS N/A 11/07/2017   Procedure: LAPAROSCOPIC GASTRIC MASS RESECTION;  Surgeon: Alben Alma, MD;  Location: [facility];  Service: General;  Laterality: N/A;   LOWER EXTREMITY ANGIOGRAPHY Left 07/04/2018   Procedure: LOWER EXTREMITY ANGIOGRAPHY;  Surgeon: Jackquelyn Mass, MD;  Location: [facility];  Service: Cardiovascular;  Laterality: Left;   LOWER EXTREMITY ANGIOGRAPHY Left 08/10/2018   Procedure: LOWER EXTREMITY ANGIOGRAPHY;  Surgeon: Jackquelyn Mass, MD;  Location: [facility];  Service: Cardiovascular;  Laterality: Left;   LOWER EXTREMITY ANGIOGRAPHY Left 09/12/2018   Procedure: LOWER EXTREMITY ANGIOGRAPHY;  Surgeon: Jackquelyn Mass, MD;  Location: [facility];  Service: Cardiovascular;  Laterality: Left;   POLYPECTOMY  03/02/2018   Procedure: POLYPECTOMY;  Surgeon: Marnee Sink, MD;  Location: [facility];  Service:  Endoscopy;;   TRANSURETHRAL RESECTION OF BLADDER TUMOR N/A 09/23/2019   Procedure: TRANSURETHRAL RESECTION OF BLADDER TUMOR (TURBT);  Surgeon: Dustin Gimenez, MD;  Location: [facility];  Service: Urology;  Laterality: N/A;    Social History   Tobacco Use   Smoking status: Every Day    Current packs/day: 1.00    Average packs/day: 1 pack/day for 70.6 years (70.6 ttl pk-yrs)    Types: Cigarettes    Start date: 11/18/1952   Smokeless tobacco: Never   Tobacco comments:         Vaping Use   Vaping status: Never Used  Substance Use Topics   Alcohol use: Yes    Alcohol/week: 1.0 standard drink of alcohol    Types: 1 Cans of beer per week    Comment: occasional   Drug use: Never     Medication list has been reviewed and updated.  Current Meds  Medication Sig   amLODipine  (NORVASC ) 10 MG tablet TAKE 1 TABLET(10 MG) BY MOUTH DAILY   atorvastatin  (LIPITOR) 40 MG tablet Take 1 tablet (40 mg total) by mouth daily.   cilostazol  (PLETAL ) 100 MG tablet TAKE 1 TABLET BY MOUTH TWICE DAILY   clopidogrel  (PLAVIX ) 75 MG  tablet Take 1 tablet (75 mg total) by mouth daily.   cyanocobalamin  (VITAMIN B12) 1000 MCG tablet Take 3,000 mcg by mouth daily.   doxycycline  (VIBRAMYCIN ) 100 MG capsule Take 1 capsule (100 mg total) by mouth 2 (two) times daily.   folic acid  (FOLVITE ) 1 MG tablet Take 1 mg by mouth daily.   lisinopril  (ZESTRIL ) 5 MG tablet Take 1 tablet (5 mg total) by mouth daily.   montelukast  (SINGULAIR ) 10 MG tablet Take 1 tablet (10 mg total) by mouth at bedtime.   Multiple Vitamins-Minerals (MULTIVITAMIN ADULT PO) Take 1 tablet by mouth daily.    Vitamin D3 (VITAMIN D ) 25 MCG tablet Take 1 tablet by mouth daily.       07/04/2023    9:48 AM 02/27/2023   10:04 AM 05/26/2022    9:49 AM 11/24/2021    9:39 AM  GAD 7 : Generalized Anxiety Score  Nervous, Anxious, on Edge 0 0 0 0  Control/stop worrying 0 0 0 0  Worry too much - different things 0 0 0 0  Trouble relaxing 0 0 0 0  Restless 0  0 0 0  Easily annoyed or irritable 0 0 0 0  Afraid - awful might happen 0 0 0 0  Total GAD 7 Score 0 0 0 0  Anxiety Difficulty Not difficult at all Not difficult at all Not difficult at all Not difficult at all       07/04/2023    9:47 AM 02/27/2023   10:03 AM 11/25/2022    8:56 AM  Depression screen PHQ 2/9  Decreased Interest 0 0 0  Down, Depressed, Hopeless 0 0 0  PHQ - 2 Score 0 0 0  Altered sleeping 0 0 0  Tired, decreased energy 0 0 0  Change in appetite 0 0 0  Feeling bad or failure about yourself  0 0 0  Trouble concentrating 0 0 0  Moving slowly or fidgety/restless 0 0 0  Suicidal thoughts 0 0 0  PHQ-9 Score 0 0 0  Difficult doing work/chores Not difficult at all Not difficult at all Not difficult at all    BP Readings from Last 3 Encounters:  07/04/23 (!) 100/50  06/26/23 132/68  02/27/23 118/66    Physical Exam Vitals and nursing note reviewed.  Constitutional:      Appearance: He is well-developed.  HENT:     Head: Normocephalic and atraumatic.     Right Ear: Tympanic membrane, ear canal and external ear normal.     Left Ear: Tympanic membrane, ear canal and external ear normal.     Nose: Nose normal.     Mouth/Throat:     Dentition: Normal dentition.  Eyes:     General: Lids are normal. No scleral icterus.    Conjunctiva/sclera: Conjunctivae normal.     Pupils: Pupils are equal, round, and reactive to light.  Neck:     Thyroid : No thyromegaly.     Vascular: No carotid bruit, hepatojugular reflux or JVD.     Trachea: No tracheal deviation.  Cardiovascular:     Rate and Rhythm: Normal rate and regular rhythm.     Heart sounds: Normal heart sounds. No murmur heard.    No friction rub. No gallop.  Pulmonary:     Effort: Pulmonary effort is normal.     Breath sounds: Normal breath sounds. No wheezing, rhonchi or rales.  Abdominal:     General: Bowel sounds are normal.     Palpations: Abdomen is  soft. There is no hepatomegaly, splenomegaly or mass.      Tenderness: There is no abdominal tenderness.     Hernia: There is no hernia in the left inguinal area.  Musculoskeletal:        General: Normal range of motion.     Cervical back: Normal range of motion and neck supple.  Lymphadenopathy:     Cervical: No cervical adenopathy.  Skin:    General: Skin is warm and dry.     Findings: No rash.  Neurological:     Mental Status: He is alert and oriented to person, place, and time.     Sensory: No sensory deficit.     Deep Tendon Reflexes: Reflexes are normal and symmetric.  Psychiatric:        Mood and Affect: Mood is not anxious or depressed.     Wt Readings from Last 3 Encounters:  07/04/23 127 lb (57.6 kg)  06/26/23 139 lb 9.6 oz (63.3 kg)  02/27/23 128 lb 12.8 oz (58.4 kg)    BP (!) 100/50   Pulse 67   Ht 5\' 5"  (1.651 m)   Wt 127 lb (57.6 kg)   SpO2 97%   BMI 21.13 kg/m   Assessment and Plan: 1. Essential hypertension (Primary) Chronic.  Controlled.  Stable.  Blood pressure today is 100/50.  Asymptomatic.  Tolerating medications well.  Continue amlodipine  10 mg once a day, lisinopril  5 mg once a day, and we will check CMP for current level of GFR and electrolytes. - amLODipine  (NORVASC ) 10 MG tablet; TAKE 1 TABLET(10 MG) BY MOUTH DAILY  Dispense: 90 tablet; Refill: 1 - lisinopril  (ZESTRIL ) 5 MG tablet; Take 1 tablet (5 mg total) by mouth daily.  Dispense: 90 tablet; Refill: 1 - Comprehensive metabolic panel with GFR  2. PAD (peripheral artery disease) (HCC) Chronic.  Controlled.  Stable.  Also followed by [facility].  Will continue amlodipine  10 mg once a day.  As well as Pletal  100 mg twice a day and Plavix  75 mg once a day.  Patient will continue maintenance with [facility] and we will continue current medications as noted. - amLODipine  (NORVASC ) 10 MG tablet; TAKE 1 TABLET(10 MG) BY MOUTH DAILY  Dispense: 90 tablet; Refill: 1 - cilostazol  (PLETAL ) 100 MG tablet; TAKE 1 TABLET BY MOUTH TWICE  DAILY  Dispense: 180 tablet; Refill: 1 - clopidogrel  (PLAVIX ) 75 MG tablet; Take 1 tablet (75 mg total) by mouth daily.  Dispense: 90 tablet; Refill: 1 - lisinopril  (ZESTRIL ) 5 MG tablet; Take 1 tablet (5 mg total) by mouth daily.  Dispense: 90 tablet; Refill: 1  3. Coronary artery disease involving native coronary artery of native heart without angina pectoris Chronic.  Controlled.  Stable.  Asymptomatic without anginal or other suggestion of ischemic heart disease.  Currently will continue with blood pressure maintenance on lisinopril  and amlodipine  and patient will continue Plavix  75 mg once a day. - amLODipine  (NORVASC ) 10 MG tablet; TAKE 1 TABLET(10 MG) BY MOUTH DAILY  Dispense: 90 tablet; Refill: 1 - clopidogrel  (PLAVIX ) 75 MG tablet; Take 1 tablet (75 mg total) by mouth daily.  Dispense: 90 tablet; Refill: 1 - lisinopril  (ZESTRIL ) 5 MG tablet; Take 1 tablet (5 mg total) by mouth daily.  Dispense: 90 tablet; Refill: 1  4. Hyperlipidemia, mixed Chronic.  Controlled.  Stable.  Asymptomatic.  Without myalgias or muscle weakness.  Continue atorvastatin  40 mg once a day.  Will recheck in 6 months. - atorvastatin  (LIPITOR)  40 MG tablet; Take 1 tablet (40 mg total) by mouth daily.  Dispense: 90 tablet; Refill: 1  5. Seasonal allergic rhinitis due to pollen Chronic.  Controlled.  Stable.  Symptomatic relief is occurring currently with montelukast  10 mg nightly. - montelukast  (SINGULAIR ) 10 MG tablet; Take 1 tablet (10 mg total) by mouth at bedtime.  Dispense: 90 tablet; Refill: 1  6. Nocturia Patient is having occasional nocturia and will check PSA. - PSA  7. Prediabetes Will recheck patient at current level of A1c and patient has been instructed to avoid concentrated sweets and limiting carbohydrates. - Hemoglobin A1c     Alayne Allis, MD

## 2023-07-05 ENCOUNTER — Ambulatory Visit: Payer: Self-pay | Admitting: Family Medicine

## 2023-07-05 LAB — COMPREHENSIVE METABOLIC PANEL WITH GFR
ALT: 24 IU/L (ref 0–44)
AST: 29 IU/L (ref 0–40)
Albumin: 4.4 g/dL (ref 3.7–4.7)
Alkaline Phosphatase: 74 IU/L (ref 44–121)
BUN/Creatinine Ratio: 14 (ref 10–24)
BUN: 11 mg/dL (ref 8–27)
Bilirubin Total: 0.5 mg/dL (ref 0.0–1.2)
CO2: 24 mmol/L (ref 20–29)
Calcium: 9.6 mg/dL (ref 8.6–10.2)
Chloride: 103 mmol/L (ref 96–106)
Creatinine, Ser: 0.79 mg/dL (ref 0.76–1.27)
Globulin, Total: 2.2 g/dL (ref 1.5–4.5)
Glucose: 116 mg/dL — ABNORMAL HIGH (ref 70–99)
Potassium: 4.4 mmol/L (ref 3.5–5.2)
Sodium: 140 mmol/L (ref 134–144)
Total Protein: 6.6 g/dL (ref 6.0–8.5)
eGFR: 88 mL/min/{1.73_m2} (ref >59)

## 2023-07-05 LAB — HEMOGLOBIN A1C
Est. average glucose Bld gHb Est-mCnc: 120 mg/dL
Hgb A1c MFr Bld: 5.8 % — ABNORMAL HIGH (ref 4.8–5.6)

## 2023-07-05 LAB — PSA: Prostate Specific Ag, Serum: <0.1 ng/mL (ref 0.0–4.0)

## 2023-09-18 ENCOUNTER — Encounter

## 2023-09-20 ENCOUNTER — Ambulatory Visit (INDEPENDENT_AMBULATORY_CARE_PROVIDER_SITE_OTHER): Admitting: Emergency Medicine

## 2023-09-20 VITALS — Ht 65.0 in | Wt 130.0 lb

## 2023-09-20 DIAGNOSIS — Z0000 Encounter for general adult medical examination without abnormal findings: Secondary | ICD-10-CM

## 2023-09-20 NOTE — Progress Notes (Signed)
 Subjective:   Dylan Villanueva is a 83 y.o. who presents for a Medicare Wellness preventive visit.  As a reminder, Annual Wellness Visits don't include a physical exam, and some assessments may be limited, especially if this visit is performed virtually. We may recommend an in-person follow-up visit with your provider if needed.  Visit Complete: Virtual I connected with  Dylan Villanueva on 09/20/23 by a audio enabled telemedicine application and verified that I am speaking with the correct person using two identifiers.  Patient Location: Home  Provider Location: Home Office  I discussed the limitations of evaluation and management by telemedicine. The patient expressed understanding and agreed to proceed.  Vital Signs: Because this visit was a virtual/telehealth visit, some criteria may be missing or patient reported. Any vitals not documented were not able to be obtained and vitals that have been documented are patient reported.  VideoDeclined- This patient declined Librarian, academic. Therefore the visit was completed with audio only.  Persons Participating in Visit: Patient.  AWV Questionnaire: No: Patient Medicare AWV questionnaire was not completed prior to this visit.  Cardiac Risk Factors include: advanced age (>93men, >30 women);male gender;hypertension;dyslipidemia;smoking/ tobacco exposure;Other (see comment), Risk factor comments: CAD     Objective:    Today's Vitals   09/20/23 0928  Weight: 130 lb (59 kg)  Height: 5' 5 (1.651 m)   Body mass index is 21.63 kg/m.     09/20/2023    9:42 AM 09/05/2022    9:23 AM 07/06/2021    8:48 PM 08/12/2020    8:37 AM 09/23/2019    9:18 AM 09/13/2019    1:30 PM 08/12/2019   11:29 AM  Advanced Directives  Does Patient Have a Medical Advance Directive? Yes Yes No Yes Yes Yes Yes  Type of Estate agent of [place];Living will Healthcare Power of [employer] of [place];Living  will Healthcare Power of [employer] of [place];Living will Healthcare Power of [place];Living will  Does patient want to make changes to medical advance directive? No - Patient declined No - Patient declined   No - Patient declined    Copy of Healthcare Power of Attorney in Chart? No - copy requested No - copy requested  No - copy requested No - copy requested No - copy requested No - copy requested    Current Medications (verified) Outpatient Encounter Medications as of 09/20/2023  Medication Sig   amLODipine  (NORVASC ) 10 MG tablet TAKE 1 TABLET(10 MG) BY MOUTH DAILY   atorvastatin  (LIPITOR) 40 MG tablet Take 1 tablet (40 mg total) by mouth daily.   cilostazol  (PLETAL ) 100 MG tablet TAKE 1 TABLET BY MOUTH TWICE DAILY   clopidogrel  (PLAVIX ) 75 MG tablet Take 1 tablet (75 mg total) by mouth daily.   cyanocobalamin  (VITAMIN B12) 1000 MCG tablet Take 3,000 mcg by mouth daily.   folic acid  (FOLVITE ) 1 MG tablet Take 1 mg by mouth daily.   lisinopril  (ZESTRIL ) 5 MG tablet Take 1 tablet (5 mg total) by mouth daily.   montelukast  (SINGULAIR ) 10 MG tablet Take 1 tablet (10 mg total) by mouth at bedtime.   Multiple Vitamins-Minerals (MULTIVITAMIN ADULT PO) Take 1 tablet by mouth daily.    Vitamin D3 (VITAMIN D ) 25 MCG tablet Take 1 tablet by mouth daily.   doxycycline  (VIBRAMYCIN ) 100 MG capsule Take 1 capsule (100 mg total) by mouth 2 (two) times daily. (Patient not taking: Reported on 09/20/2023)   No facility-administered encounter medications on file  as of 09/20/2023.    Allergies (verified) Penicillins and Sulfa  antibiotics   History: Past Medical History:  Diagnosis Date   Cancer (HCC) 2006   prostate    Colon polyps    Coronary artery disease    Dyspnea    With exertion climbing up hill   Hyperlipidemia    Hypertension    Myocardial infarction [facility])    Prostate CA (HCC)    Smoker    Squamous acanthoma of external ear, right    Past Surgical History:  Procedure  Laterality Date   APPENDECTOMY  1980   CARDIAC SURGERY     COLONOSCOPY WITH PROPOFOL  N/A 03/02/2018   Procedure: COLONOSCOPY WITH PROPOFOL ;  Surgeon: Jinny Carmine, MD;  Location: [facility];  Service: Endoscopy;  Laterality: N/A;   CORONARY ANGIOPLASTY WITH STENT PLACEMENT     CYSTOSCOPY W/ RETROGRADES Bilateral 09/23/2019   Procedure: CYSTOSCOPY WITH RETROGRADE PYELOGRAM;  Surgeon: Penne Knee, MD;  Location: [facility] OR[facility];  Service: Urology;  Laterality: Bilateral;   CYSTOSCOPY WITH URETHRAL DILATATION N/A 09/23/2019   Procedure: CYSTOSCOPY WITH URETHRAL DILATATION;  Surgeon: Penne Knee, MD;  Location: [facility];  Service: Urology;  Laterality: N/A;   ESOPHAGOGASTRODUODENOSCOPY N/A 11/07/2017   Procedure: ESOPHAGOGASTRODUODENOSCOPY (EGD);  Surgeon: Jordis Laneta FALCON, MD;  Location: [facility] OR[facility];  Service: General;  Laterality: N/A;   ESOPHAGOGASTRODUODENOSCOPY (EGD) WITH PROPOFOL   03/02/2018   Procedure: ESOPHAGOGASTRODUODENOSCOPY (EGD) WITH PROPOFOL ;  Surgeon: Jinny Carmine, MD;  Location: [facility];  Service: Endoscopy;;   EUS N/A 07/13/2017   Procedure: FULL UPPER ENDOSCOPIC ULTRASOUND (EUS) RADIAL;  Surgeon: Glena Mt, MD;  Location: [facility] END[facility];  Service: Endoscopy;  Laterality: N/A;   HIGH DOSE RADIATION IMPLANT INSERTION  2006   LAPAROSCOPIC REMOVAL ABDOMINAL MASS N/A 11/07/2017   Procedure: LAPAROSCOPIC GASTRIC MASS RESECTION;  Surgeon: Jordis Laneta FALCON, MD;  Location: [facility];  Service: General;  Laterality: N/A;   LOWER EXTREMITY ANGIOGRAPHY Left 07/04/2018   Procedure: LOWER EXTREMITY ANGIOGRAPHY;  Surgeon: Jama Cordella MATSU, MD;  Location: [facility];  Service: Cardiovascular;  Laterality: Left;   LOWER EXTREMITY ANGIOGRAPHY Left 08/10/2018   Procedure: LOWER EXTREMITY ANGIOGRAPHY;  Surgeon: Jama Cordella MATSU, MD;  Location: [facility];  Service: Cardiovascular;  Laterality: Left;   LOWER EXTREMITY ANGIOGRAPHY Left 09/12/2018   Procedure: LOWER  EXTREMITY ANGIOGRAPHY;  Surgeon: Jama Cordella MATSU, MD;  Location: [facility];  Service: Cardiovascular;  Laterality: Left;   POLYPECTOMY  03/02/2018   Procedure: POLYPECTOMY;  Surgeon: Jinny Carmine, MD;  Location: [facility];  Service: Endoscopy;;   TRANSURETHRAL RESECTION OF BLADDER TUMOR N/A 09/23/2019   Procedure: TRANSURETHRAL RESECTION OF BLADDER TUMOR (TURBT);  Surgeon: Penne Knee, MD;  Location: [facility];  Service: Urology;  Laterality: N/A;   Family History  Problem Relation Age of Onset   Cancer Mother        pancreatic   Dementia Father    Cancer Sister        breast    Aneurysm Brother    Bladder Cancer Neg Hx    Prostate cancer Neg Hx    Kidney cancer Neg Hx    Social History   Socioeconomic History   Marital status: Married    Spouse name: [place]   Number of children: 2   Years of education: Not on file   Highest education level: Bachelor's degree (e.g., BA, AB, BS)  Occupational History   Occupation: retired   Tobacco Use   Smoking status:  Every Day    Current packs/day: 1.00    Average packs/day: 1 pack/day for 70.8 years (70.8 ttl pk-yrs)    Types: Cigarettes    Start date: 11/18/1952   Smokeless tobacco: Never   Tobacco comments:         Vaping Use   Vaping status: Never Used  Substance and Sexual Activity   Alcohol use: Yes    Comment: occasional, 1 beer monthly or less   Drug use: Never   Sexual activity: Not Currently  Other Topics Concern   Not on file  Social History Narrative   Not on file   Social Drivers of Health   Financial Resource Strain: Low Risk  (09/20/2023)   Overall Financial Resource Strain (CARDIA)    Difficulty of Paying Living Expenses: Not hard at all  Food Insecurity: No Food Insecurity (09/20/2023)   Hunger Vital Sign    Worried About Running Out of Food in the Last Year: Never true    Ran Out of Food in the Last Year: Never true  Transportation Needs: No Transportation Needs (09/20/2023)   PRAPARE -  Administrator, Civil Service (Medical): No    Lack of Transportation (Non-Medical): No  Physical Activity: Inactive (09/20/2023)   Exercise Vital Sign    Days of Exercise per Week: 0 days    Minutes of Exercise per Session: 0 min  Stress: No Stress Concern Present (09/20/2023)   [employer] of Occupational Health - Occupational Stress Questionnaire    Feeling of Stress: Not at all  Social Connections: Moderately Isolated (09/20/2023)   Social Connection and Isolation Panel    Frequency of Communication with Friends and Family: More than three times a week    Frequency of Social Gatherings with Friends and Family: More than three times a week    Attends Religious Services: Never    Database administrator or Organizations: No    Attends Engineer, structural: Never    Marital Status: Married    Tobacco Counseling Ready to quit: Not Answered Counseling given: Not Answered Tobacco comments:        Clinical Intake:  Pre-visit preparation completed: Yes  Pain : No/denies pain     BMI - recorded: 21.63 Nutritional Status: BMI of 19-24  Normal Nutritional Risks: None Diabetes: No  Lab Results  Component Value Date   HGBA1C 5.8 (H) 07/04/2023   HGBA1C 6.0 (H) 05/26/2022   HGBA1C 5.8 (H) 11/24/2021     How often do you need to have someone help you when you read instructions, pamphlets, or other written materials from your doctor or pharmacy?: 1 - Never  Interpreter Needed?: No  Information entered by :: Vina Ned, CMA   Activities of Daily Living     09/20/2023    9:29 AM  In your present state of health, do you have any difficulty performing the following activities:  Hearing? 1  Comment a little bit  Vision? 0  Difficulty concentrating or making decisions? 0  Walking or climbing stairs? 1  Comment sometimes due to right leg gets weak  Dressing or bathing? 0  Doing errands, shopping? 0  Preparing Food and eating ? N  Using the  Toilet? N  In the past six months, have you accidently leaked urine? Y  Comment wears pad  Do you have problems with loss of bowel control? N  Managing your Medications? N  Managing your Finances? N  Housekeeping or managing your Housekeeping? N  Patient Care Team: Kotturi, Vinay K, MD as PCP - General (Family Medicine) Schnier, Cordella MATSU, MD (Vascular Surgery) Cathlyn Seal, MD as Referring Physician (Dermatology) Florencio Cara BIRCH, MD as Consulting Physician (Cardiology)  I have updated your Care Teams any recent Medical Services you may have received from other providers in the past year.     Assessment:   This is a routine wellness examination for Brode.  Hearing/Vision screen Hearing Screening - Comments:: &quot;A little bit&quot;, declined referral to ENT for evaluation  Vision Screening - Comments:: Needs routine eye exams, included list of eye doctors in [place]   Goals Addressed               This Visit's Progress     Increase physical activity (pt-stated)         Depression Screen     09/20/2023    9:39 AM 07/04/2023    9:47 AM 02/27/2023   10:03 AM 11/25/2022    8:56 AM 09/05/2022    9:25 AM 09/05/2022    9:22 AM 05/26/2022    9:49 AM  PHQ 2/9 Scores  PHQ - 2 Score 0 0 0 0 0 0 0  PHQ- 9 Score 0 0 0 0   0    Fall Risk     09/20/2023    9:46 AM 07/04/2023    9:47 AM 02/27/2023   10:03 AM 11/25/2022    8:56 AM 09/05/2022    9:25 AM  Fall Risk   Falls in the past year? 0 0 0 0 0  Number falls in past yr: 0 0 0 0 0  Injury with Fall? 0 0 0  0  Risk for fall due to : No Fall Risks No Fall Risks No Fall Risks  No Fall Risks  Follow up Falls evaluation completed Falls evaluation completed Falls evaluation completed  Falls evaluation completed;Falls prevention discussed    MEDICARE RISK AT HOME:  Medicare Risk at Home Any stairs in or around the home?: Yes If so, are there any without handrails?: No Home free of loose throw rugs in walkways, pet  beds, electrical cords, etc?: Yes Adequate lighting in your home to reduce risk of falls?: Yes Life alert?: No Use of a cane, walker or w/c?: No Grab bars in the bathroom?: No Shower chair or bench in shower?: No Elevated toilet seat or a handicapped toilet?: No  TIMED UP AND GO:  Was the test performed?  No  Cognitive Function: 6CIT completed        09/20/2023    9:47 AM 09/05/2022    9:26 AM 08/30/2021    9:02 AM 08/12/2019   11:43 AM 08/08/2018   11:52 AM  6CIT Screen  What Year? 0 points 0 points 0 points 0 points 0 points  What month? 0 points 0 points 0 points 0 points 0 points  What time? 0 points 0 points 0 points 0 points 0 points  Count back from 20 0 points 0 points 0 points 0 points 0 points  Months in reverse 0 points 0 points 0 points 0 points 0 points  Repeat phrase 0 points 0 points 0 points 0 points 0 points  Total Score 0 points 0 points 0 points 0 points 0 points    Immunizations Immunization History  Administered Date(s) Administered   Fluad Quad(high Dose 65+) 11/15/2019, 11/23/2020, 11/24/2021   Influenza, High Dose Seasonal PF 01/20/2017, 12/13/2017, 11/10/2018   PFIZER Comirnaty(Gray Top)Covid-19 Tri-Sucrose Vaccine 02/28/2019, 03/22/2019  PFIZER(Purple Top)SARS-COV-2 Vaccination 02/28/2019, 03/22/2019, 12/08/2019   Pneumococcal Conjugate-13 05/08/2017   Pneumococcal Polysaccharide-23 05/20/2019   Tdap 05/16/2017   Zoster Recombinant(Shingrix) 05/16/2017, 07/26/2017    Screening Tests Health Maintenance  Topic Date Due   COVID-19 Vaccine (6 - 2024-25 season) 10/16/2022   INFLUENZA VACCINE  09/15/2023   Medicare Annual Wellness (AWV)  09/19/2024   DTaP/Tdap/Td (2 - Td or Tdap) 05/17/2027   Pneumococcal Vaccine: 50+ Years  Completed   Zoster Vaccines- Shingrix  Completed   Hepatitis B Vaccines  Aged Out   HPV VACCINES  Aged Out   Meningococcal B Vaccine  Aged Out    Health Maintenance  Health Maintenance Due  Topic Date Due   COVID-19  Vaccine (6 - 2024-25 season) 10/16/2022   INFLUENZA VACCINE  09/15/2023   Health Maintenance Items Addressed: See Nurse Notes at the end of this note  Additional Screening:  Vision Screening: Recommended annual ophthalmology exams for early detection of glaucoma and other disorders of the eye. Would you like a referral to an eye doctor? No    Dental Screening: Recommended annual dental exams for proper oral hygiene  Community Resource Referral / Chronic Care Management: CRR required this visit?  No   CCM required this visit?  No   Plan:    I have personally reviewed and noted the following in the patient's chart:   Medical and social history Use of alcohol, tobacco or illicit drugs  Current medications and supplements including opioid prescriptions. Patient is not currently taking opioid prescriptions. Functional ability and status Nutritional status Physical activity Advanced directives List of other physicians Hospitalizations, surgeries, and ER visits in previous 12 months Vitals Screenings to include cognitive, depression, and falls Referrals and appointments  In addition, I have reviewed and discussed with patient certain preventive protocols, quality metrics, and best practice recommendations. A written personalized care plan for preventive services as well as general preventive health recommendations were provided to patient.   Vina Ned, CMA   09/20/2023   After Visit Summary: (Mail) Due to this being a telephonic visit, the after visit summary with patients personalized plan was offered to patient via mail   Notes:  Needs routine eye exam. Included list of eye doctors in [place] Needs flu vaccine in the fall Declined Covid vaccine

## 2023-09-20 NOTE — Patient Instructions (Signed)
 [name] , Thank you for taking time out of your busy schedule to complete your Annual Wellness Visit with me. I enjoyed our conversation and look forward to speaking with you again next year. I, as well as your care team,  appreciate your ongoing commitment to your health goals. Please review the following plan we discussed and let me know if I can assist you in the future. Your Game plan/ To Do List    Referrals: None   Follow up Visits: We will see or speak with you next year for your Next Medicare AWV with our clinical staff Have you seen your provider in the last 6 months (3 months if uncontrolled diabetes)? Yes  Clinician Recommendations: Get a routine eye exam at your earliest convenience. I have included a list of eye doctors in the area. Get the flu vaccine in the fall.  Aim for 30 minutes of exercise or brisk walking, 6-8 glasses of water , and 5 servings of fruits and vegetables each day.       This is a list of the screenings recommended for you:  Health Maintenance  Topic Date Due   COVID-19 Vaccine (6 - 2024-25 season) 10/16/2022   Flu Shot  09/15/2023   Medicare Annual Wellness Visit  09/19/2024   DTaP/Tdap/Td vaccine (2 - Td or Tdap) 05/17/2027   Pneumococcal Vaccine for age over 62  Completed   Zoster (Shingles) Vaccine  Completed   Hepatitis B Vaccine  Aged Out   HPV Vaccine  Aged Out   Meningitis B Vaccine  Aged Out    Advanced directives: (Copy Requested) Please bring a copy of your health care power of attorney and living will to the office to be added to your chart at your convenience. You can mail to [facility] [address] [place], [place] [address] or email to [email or web address] Advance Care Planning is important because it:  [x]  Makes sure you receive the medical care that is consistent with your values, goals, and preferences  [x]  It provides guidance to your family and loved ones and reduces their decisional burden about whether  or not they are making the right decisions based on your wishes.  Follow the link provided in your after visit summary or read over the paperwork we have mailed to you to help you started getting your Advance Directives in place. If you need assistance in completing these, please reach out to us  so that we can help you!  See attachments for Preventive Care and Fall Prevention Tips.    There are several Eye Doctors in your area. Here are a few that usually accept all insurance types:  [facility] [address] [place], [place] [address] Phone: [phone]  [facility] [address] [place], [place] [address] Phone: [phone]  [facility] [address] [place], [place] [address] Phone: [phone]  [facility] [address] [place], [place] [address] Phone: [phone]  [facility] [address] [place], [place] [address] Phone: [phone]  [facility] [address] [place], [place] [address] Phone: [phone]  Please let us  know if you require a referral for an eye exam appointment. Thank you!    Fall Prevention in the Home, Adult Falls can cause injuries and affect people of all ages. There are many simple things that you can do to make your home safe and to help prevent falls. If you need it, ask for help making these changes.  What actions can I take to prevent falls? General information Use good lighting in all rooms. Make sure to: Replace any light bulbs that burn out. Turn on lights if it is dark and use night-lights. Keep items that you use often in easy-to-reach places. Lower the shelves around your home if needed. Move furniture so that there are clear paths around it. Do not keep throw rugs or other things on the floor that can make you trip. If any of your floors are uneven, fix them. Add color or contrast paint or tape to clearly mark and help you see: Grab bars or handrails. First and last steps of staircases. Where the  edge of each step is. If you use a ladder or stepladder: Make sure that it is fully opened. Do not climb a closed ladder. Make sure the sides of the ladder are locked in place. Have someone hold the ladder while you use it. Know where your pets are as you move through your home. What can I do in the bathroom?     Keep the floor dry. Clean up any water  that is on the floor right away. Remove soap buildup in the bathtub or shower. Buildup makes bathtubs and showers slippery. Use non-skid mats or decals on the floor of the bathtub or shower. Attach bath mats securely with double-sided, non-slip rug tape. If you need to sit down while you are in the shower, use a non-slip stool. Install grab bars by the toilet and in the bathtub and shower. Do not use towel bars as grab bars. What can I do in the bedroom? Make sure that you have a light by your bed that is easy to reach. Do not use any sheets or blankets on your bed that hang to the floor. Have a firm bench or chair with side arms that you can use for support when you get dressed. What can I do in the kitchen? Clean up any spills right away. If you need to reach something above you, use a sturdy step stool that has a grab bar. Keep electrical cables out of the way. Do not use floor polish or wax that makes floors slippery. What can I do with my stairs? Do not leave anything on the stairs. Make sure that you have a light switch at the top and the bottom of the stairs. Have them installed if you do not have them. Make sure that there are handrails on both sides of the stairs. Fix handrails that are broken or loose. Make sure that handrails are as long as the staircases. Install non-slip stair treads on all stairs in your home if they do not have carpet. Avoid having throw rugs at the top or bottom of stairs, or secure the rugs with carpet tape to prevent them from moving. Choose a carpet design that does not hide the edge of steps on the  stairs. Make sure that carpet is firmly attached to the stairs. Fix any carpet that is loose or worn. What can I do on the outside of my home? Use bright outdoor lighting. Repair the edges of walkways and driveways and fix any cracks. Clear paths of anything that can make you trip, such as tools or rocks. Add color or contrast paint or tape to clearly mark and help you see high doorway thresholds. Trim any bushes or trees on the main path into your home. Check that handrails are securely fastened and in good repair. Both sides of all steps should  have handrails. Install guardrails along the edges of any raised decks or porches. Have leaves, snow, and ice cleared regularly. Use sand, salt, or ice melt on walkways during winter months if you live where there is ice and snow. In the garage, clean up any spills right away, including grease or oil spills. What other actions can I take? Review your medicines with your health care provider. Some medicines can make you confused or feel dizzy. This can increase your chance of falling. Wear closed-toe shoes that fit well and support your feet. Wear shoes that have rubber soles and low heels. Use a cane, walker, scooter, or crutches that help you move around if needed. Talk with your provider about other ways that you can decrease your risk of falls. This may include seeing a physical therapist to learn to do exercises to improve movement and strength. Where to find more information Centers for Disease Control and Prevention, [employer]: TonerPromos.no [employer] on Aging: BaseRingTones.pl National Institute on Aging: BaseRingTones.pl Contact a health care provider if: You are afraid of falling at home. You feel weak, drowsy, or dizzy at home. You fall at home. Get help right away if you: Lose consciousness or have trouble moving after a fall. Have a fall that causes a head injury. These symptoms may be an emergency. Get help right away. Call 911. Do not wait to  see if the symptoms will go away. Do not drive yourself to the hospital. This information is not intended to replace advice given to you by your health care provider. Make sure you discuss any questions you have with your health care provider. Document Revised: 10/04/2021 Document Reviewed: 10/04/2021 Elsevier Patient Education  2024 [employer].

## 2023-11-17 ENCOUNTER — Other Ambulatory Visit: Payer: Self-pay

## 2023-11-17 DIAGNOSIS — I2510 Atherosclerotic heart disease of native coronary artery without angina pectoris: Secondary | ICD-10-CM

## 2023-11-17 DIAGNOSIS — I739 Peripheral vascular disease, unspecified: Secondary | ICD-10-CM

## 2023-11-17 DIAGNOSIS — J301 Allergic rhinitis due to pollen: Secondary | ICD-10-CM

## 2023-11-17 DIAGNOSIS — E782 Mixed hyperlipidemia: Secondary | ICD-10-CM

## 2023-11-17 DIAGNOSIS — I10 Essential (primary) hypertension: Secondary | ICD-10-CM

## 2023-11-17 MED ORDER — ATORVASTATIN CALCIUM 40 MG PO TABS: 40.0000 mg | ORAL_TABLET | Freq: Every day | ORAL | 1 refills | Status: AC

## 2023-11-17 MED ORDER — CLOPIDOGREL BISULFATE 75 MG PO TABS
75.0000 mg | ORAL_TABLET | Freq: Every day | ORAL | 1 refills | Status: AC
Start: 2023-11-17 — End: ?

## 2023-11-17 MED ORDER — LISINOPRIL 5 MG PO TABS: 5.0000 mg | ORAL_TABLET | Freq: Every day | ORAL | 1 refills | Status: AC

## 2023-11-17 MED ORDER — AMLODIPINE BESYLATE 10 MG PO TABS
ORAL_TABLET | ORAL | 1 refills | Status: AC
Start: 2023-11-17 — End: ?

## 2023-11-17 MED ORDER — MONTELUKAST SODIUM 10 MG PO TABS: 10.0000 mg | ORAL_TABLET | Freq: Every day | ORAL | 1 refills | Status: AC

## 2023-11-17 MED ORDER — CILOSTAZOL 100 MG PO TABS: ORAL_TABLET | ORAL | 1 refills | Status: DC

## 2024-01-04 ENCOUNTER — Ambulatory Visit (INDEPENDENT_AMBULATORY_CARE_PROVIDER_SITE_OTHER): Admitting: Family Medicine

## 2024-01-04 ENCOUNTER — Encounter: Payer: Self-pay | Admitting: Family Medicine

## 2024-01-04 VITALS — BP 110/62 | HR 72 | Ht 65.0 in | Wt 124.0 lb

## 2024-01-04 DIAGNOSIS — I739 Peripheral vascular disease, unspecified: Secondary | ICD-10-CM

## 2024-01-04 DIAGNOSIS — R6889 Other general symptoms and signs: Secondary | ICD-10-CM | POA: Diagnosis not present

## 2024-01-04 DIAGNOSIS — F17200 Nicotine dependence, unspecified, uncomplicated: Secondary | ICD-10-CM | POA: Diagnosis not present

## 2024-01-04 DIAGNOSIS — I10 Essential (primary) hypertension: Secondary | ICD-10-CM | POA: Diagnosis not present

## 2024-01-04 DIAGNOSIS — I2510 Atherosclerotic heart disease of native coronary artery without angina pectoris: Secondary | ICD-10-CM | POA: Diagnosis not present

## 2024-01-04 DIAGNOSIS — J410 Simple chronic bronchitis: Secondary | ICD-10-CM | POA: Diagnosis not present

## 2024-01-04 DIAGNOSIS — I6523 Occlusion and stenosis of bilateral carotid arteries: Secondary | ICD-10-CM | POA: Diagnosis not present

## 2024-01-04 DIAGNOSIS — E782 Mixed hyperlipidemia: Secondary | ICD-10-CM | POA: Diagnosis not present

## 2024-01-04 DIAGNOSIS — I214 Non-ST elevation (NSTEMI) myocardial infarction: Secondary | ICD-10-CM | POA: Diagnosis not present

## 2024-01-04 DIAGNOSIS — Z130 Encounter for screening for diseases of the blood and blood-forming organs and certain disorders involving the immune mechanism: Secondary | ICD-10-CM

## 2024-01-04 DIAGNOSIS — I70219 Atherosclerosis of native arteries of extremities with intermittent claudication, unspecified extremity: Secondary | ICD-10-CM | POA: Diagnosis not present

## 2024-01-04 DIAGNOSIS — Z79899 Other long term (current) drug therapy: Secondary | ICD-10-CM | POA: Diagnosis not present

## 2024-01-04 NOTE — Progress Notes (Signed)
 Established Patient Office Visit  Patient ID: Dylan Villanueva, male    DOB: 1940-12-26  Age: 83 y.o. MRN: [id number] PCP: Dinora Hemm K, MD  Chief Complaint  Patient presents with   Hypertension   Hyperlipidemia    Subjective:     HPI  Discussed the use of AI scribe software for clinical note transcription with the patient, who gave verbal consent to proceed.  History of Present Illness Dylan Villanueva is an 83 year old male with coronary artery disease and hypertension who presents for routine follow-up.  He has a history of coronary artery disease, including a myocardial infarction in 2004, and has two stents placed in his heart. He continues to see a cardiologist for routine checkups and has an appointment scheduled following this visit. He reports no chest pain, palpitations, or syncope. He experiences shortness of breath upon exertion but not at rest.  He has hypertension and is currently taking lisinopril , amlodipine , and atorvastatin  without any side effects. He does not monitor his blood pressure at home.  He experiences chronic post-nasal drip, described as 'chronic sinus drip', and was previously prescribed montelukast , which he discontinued due to ineffectiveness. He has an occasional cough with clear phlegm, particularly when exerting himself.  He has a history of smoking and continues to smoke with no plans to quit. He acknowledges occasional leg cramps, particularly when stretching in bed, but reports no recent falls or significant gait disturbances. He was last evaluated by a vascular surgeon in May, who reported favorable findings.  He has a history of prostate cancer and skin cancer, with previous lesions on his ears and forehead. He is under the care of a dermatologist and reports that some lesions have resolved while others have not.  He moved to [place]  approximately seven years ago to be closer to his daughter and retired in 2014 after a career in [employer].     Review of Systems  All other systems reviewed and are negative.     Objective:     BP 110/62   Pulse 72   Ht 5' 5 (1.651 m)   Wt 124 lb (56.2 kg)   SpO2 96%   BMI 20.63 kg/m  BP Readings from Last 3 Encounters:  01/04/24 110/62  07/04/23 (!) 100/50  06/26/23 132/68      Physical Exam Vitals and nursing note reviewed.  Constitutional:      Appearance: Normal appearance.  HENT:     Head: Normocephalic.     Right Ear: External ear normal.     Left Ear: External ear normal.  Eyes:     Conjunctiva/sclera: Conjunctivae normal.  Cardiovascular:     Rate and Rhythm: Normal rate.  Pulmonary:     Effort: Pulmonary effort is normal. No respiratory distress.  Abdominal:     Palpations: Abdomen is soft.  Musculoskeletal:        General: Normal range of motion.  Skin:    General: Skin is warm.  Neurological:     Mental Status: He is alert and oriented to person, place, and time.  Psychiatric:        Mood and Affect: Mood normal.     Physical Exam VITALS: BP- 110/62   No results found for any visits on 01/04/24.     The ASCVD Risk score (Arnett DK, et al., 2019) failed to calculate for the following reasons:   The 2019 ASCVD risk score is only valid for ages 84 to 85  Risk score cannot be calculated because patient has a medical history suggesting prior/existing ASCVD    Assessment & Plan:   Problem List Items Addressed This Visit       Cardiovascular and Mediastinum   Coronary artery disease involving native coronary artery of native heart     Respiratory   Simple chronic bronchitis (HCC)     Other   Hyperlipidemia, mixed   Other Visit Diagnoses       Essential hypertension    -  Primary     Cold intolerance       Relevant Orders   TSH + free T4     PAD (peripheral artery disease)         Screening for iron deficiency anemia       Relevant Orders   CBC with Differential     Encounter for long-term current use of  medication       Relevant Orders   CBC with Differential       Assessment and Plan Assessment & Plan Essential hypertension Blood pressure controlled at 110/62 mmHg without symptoms. - Continue current antihypertensive medications.  Cold intolerance Checking TSH and CBC.  Mixed hyperlipidemia Managed with atorvastatin . No dietary changes reported. - Continue atorvastatin . - Advised dietary modifications to include more white meat, poultry, and fish instead of red meat.  Atherosclerotic heart disease of native coronary artery with prior stent placement Two stents placed post-MI in 2004. No recent cardiac symptoms. Regular cardiology follow-up. - Continue current cardiac medications. - Attend upcoming cardiology appointment.  Peripheral vascular disease No recent leg cramps or significant symptoms. Last vascular evaluation in May showed good results. - Continue current management and follow-up with vascular surgeon as scheduled.  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 Previous montelukast  trial ineffective. - Restart montelukast  for chronic post-nasal drip and bronchitis.  Tobacco use Continues to smoke with no plans to quit. Smoking cessation advised. - Advised smoking cessation.  History of skin cancer Multiple skin cancers treated. Recent evaluation showed residual lesions. - Follow up with dermatologist for further evaluation and treatment of residual skin lesions.  General Health Maintenance Routine health maintenance discussed. - Continue regular health maintenance and screenings.    Return in about 6 months (around 07/03/2024) for chronic follow up with PCP.    Vinary K Yaqueline Gutter, MD [facility] at [facility]

## 2024-01-05 ENCOUNTER — Ambulatory Visit: Payer: Self-pay | Admitting: Family Medicine

## 2024-01-05 LAB — TSH+FREE T4
Free T4: 1.46 ng/dL (ref 0.82–1.77)
TSH: 0.956 u[IU]/mL (ref 0.450–4.500)

## 2024-01-05 LAB — CBC WITH DIFFERENTIAL/PLATELET
Basophils Absolute: 0.1 x10E3/uL (ref 0.0–0.2)
Basos: 1 %
EOS (ABSOLUTE): 0.1 x10E3/uL (ref 0.0–0.4)
Eos: 2 %
Hematocrit: 38.4 % (ref 37.5–51.0)
Hemoglobin: 13.1 g/dL (ref 13.0–17.7)
Immature Grans (Abs): 0 x10E3/uL (ref 0.0–0.1)
Immature Granulocytes: 0 %
Lymphocytes Absolute: 1.6 x10E3/uL (ref 0.7–3.1)
Lymphs: 18 %
MCH: 35.6 pg — ABNORMAL HIGH (ref 26.6–33.0)
MCHC: 34.1 g/dL (ref 31.5–35.7)
MCV: 104 fL — ABNORMAL HIGH (ref 79–97)
Monocytes Absolute: 0.7 x10E3/uL (ref 0.1–0.9)
Monocytes: 8 %
Neutrophils Absolute: 6.7 x10E3/uL (ref 1.4–7.0)
Neutrophils: 71 %
Platelets: 240 x10E3/uL (ref 150–450)
RBC: 3.68 x10E6/uL — ABNORMAL LOW (ref 4.14–5.80)
RDW: 11.7 % (ref 11.6–15.4)
WBC: 9.2 x10E3/uL (ref 3.4–10.8)

## 2024-01-05 NOTE — Progress Notes (Signed)
 Your blood count shows mild anemia, no need to take iron tablets, recommend taking over the counter oral Vitamin B12 tablets. Your thyroid  levels are within normal range.

## 2024-02-05 ENCOUNTER — Other Ambulatory Visit: Payer: Self-pay

## 2024-02-05 DIAGNOSIS — I739 Peripheral vascular disease, unspecified: Secondary | ICD-10-CM

## 2024-02-05 MED ORDER — CILOSTAZOL 100 MG PO TABS: ORAL_TABLET | ORAL | 1 refills | Status: AC

## 2024-06-24 ENCOUNTER — Encounter (INDEPENDENT_AMBULATORY_CARE_PROVIDER_SITE_OTHER)

## 2024-06-24 ENCOUNTER — Ambulatory Visit (INDEPENDENT_AMBULATORY_CARE_PROVIDER_SITE_OTHER): Admitting: Vascular Surgery

## 2024-07-03 ENCOUNTER — Ambulatory Visit: Admitting: Family Medicine

## 2024-09-25 ENCOUNTER — Ambulatory Visit

## 2024-09-26 ENCOUNTER — Ambulatory Visit
